# Patient Record
Sex: Male | Born: 1940 | Race: Black or African American | Hispanic: No | Marital: Married | Smoking: Never smoker
Health system: Southern US, Community
[De-identification: ages and names within clinical notes are randomized; demographics above are authoritative.]

## PROBLEM LIST (undated history)

## (undated) DIAGNOSIS — A419 Sepsis, unspecified organism: Secondary | ICD-10-CM

## (undated) DIAGNOSIS — F0390 Unspecified dementia without behavioral disturbance: Secondary | ICD-10-CM

## (undated) DIAGNOSIS — N400 Enlarged prostate without lower urinary tract symptoms: Secondary | ICD-10-CM

## (undated) DIAGNOSIS — R2681 Unsteadiness on feet: Secondary | ICD-10-CM

## (undated) DIAGNOSIS — G4089 Other seizures: Secondary | ICD-10-CM

## (undated) DIAGNOSIS — N1330 Unspecified hydronephrosis: Secondary | ICD-10-CM

## (undated) DIAGNOSIS — J1282 Pneumonia due to coronavirus disease 2019: Secondary | ICD-10-CM

## (undated) DIAGNOSIS — G9341 Metabolic encephalopathy: Secondary | ICD-10-CM

## (undated) DIAGNOSIS — M6281 Muscle weakness (generalized): Secondary | ICD-10-CM

## (undated) DIAGNOSIS — R2689 Other abnormalities of gait and mobility: Secondary | ICD-10-CM

## (undated) DIAGNOSIS — R278 Other lack of coordination: Secondary | ICD-10-CM

## (undated) DIAGNOSIS — R1312 Dysphagia, oropharyngeal phase: Secondary | ICD-10-CM

## (undated) DIAGNOSIS — J9601 Acute respiratory failure with hypoxia: Secondary | ICD-10-CM

## (undated) DIAGNOSIS — U071 COVID-19: Secondary | ICD-10-CM

## (undated) DIAGNOSIS — N179 Acute kidney failure, unspecified: Secondary | ICD-10-CM

## (undated) DIAGNOSIS — K219 Gastro-esophageal reflux disease without esophagitis: Secondary | ICD-10-CM

## (undated) DIAGNOSIS — R41841 Cognitive communication deficit: Secondary | ICD-10-CM

## (undated) DIAGNOSIS — R6521 Severe sepsis with septic shock: Secondary | ICD-10-CM

## (undated) DIAGNOSIS — I10 Essential (primary) hypertension: Secondary | ICD-10-CM

## (undated) HISTORY — PX: NO PAST SURGERIES: SHX2092

---

## 1997-11-17 ENCOUNTER — Other Ambulatory Visit: Admission: RE | Admit: 1997-11-17 | Discharge: 1997-11-17 | Payer: Self-pay | Admitting: Urology

## 1999-12-13 ENCOUNTER — Other Ambulatory Visit: Admission: RE | Admit: 1999-12-13 | Discharge: 1999-12-13 | Payer: Self-pay | Admitting: Urology

## 2000-03-03 ENCOUNTER — Emergency Department (HOSPITAL_COMMUNITY): Admission: EM | Admit: 2000-03-03 | Discharge: 2000-03-04 | Payer: Self-pay | Admitting: Internal Medicine

## 2000-12-04 ENCOUNTER — Other Ambulatory Visit: Admission: RE | Admit: 2000-12-04 | Discharge: 2000-12-04 | Payer: Self-pay | Admitting: Urology

## 2000-12-04 ENCOUNTER — Encounter (INDEPENDENT_AMBULATORY_CARE_PROVIDER_SITE_OTHER): Payer: Self-pay | Admitting: Specialist

## 2007-02-20 ENCOUNTER — Encounter: Admission: RE | Admit: 2007-02-20 | Discharge: 2007-02-20 | Payer: Self-pay | Admitting: Cardiology

## 2012-01-09 DIAGNOSIS — R972 Elevated prostate specific antigen [PSA]: Secondary | ICD-10-CM | POA: Diagnosis not present

## 2012-01-14 DIAGNOSIS — N400 Enlarged prostate without lower urinary tract symptoms: Secondary | ICD-10-CM | POA: Diagnosis not present

## 2012-01-14 DIAGNOSIS — R972 Elevated prostate specific antigen [PSA]: Secondary | ICD-10-CM | POA: Diagnosis not present

## 2012-07-08 DIAGNOSIS — N400 Enlarged prostate without lower urinary tract symptoms: Secondary | ICD-10-CM | POA: Diagnosis not present

## 2012-07-24 DIAGNOSIS — Z23 Encounter for immunization: Secondary | ICD-10-CM | POA: Diagnosis not present

## 2012-09-09 DIAGNOSIS — N400 Enlarged prostate without lower urinary tract symptoms: Secondary | ICD-10-CM | POA: Diagnosis not present

## 2012-11-11 DIAGNOSIS — R351 Nocturia: Secondary | ICD-10-CM | POA: Diagnosis not present

## 2012-11-11 DIAGNOSIS — R350 Frequency of micturition: Secondary | ICD-10-CM | POA: Diagnosis not present

## 2012-12-15 DIAGNOSIS — N400 Enlarged prostate without lower urinary tract symptoms: Secondary | ICD-10-CM | POA: Diagnosis not present

## 2013-01-20 DIAGNOSIS — N400 Enlarged prostate without lower urinary tract symptoms: Secondary | ICD-10-CM | POA: Diagnosis not present

## 2013-01-20 DIAGNOSIS — R972 Elevated prostate specific antigen [PSA]: Secondary | ICD-10-CM | POA: Diagnosis not present

## 2013-04-18 ENCOUNTER — Emergency Department (HOSPITAL_COMMUNITY): Payer: Medicare Other

## 2013-04-18 ENCOUNTER — Encounter (HOSPITAL_COMMUNITY): Payer: Self-pay | Admitting: Emergency Medicine

## 2013-04-18 ENCOUNTER — Emergency Department (HOSPITAL_COMMUNITY)
Admission: EM | Admit: 2013-04-18 | Discharge: 2013-04-18 | Disposition: A | Discharge status: Home / Self Care | Payer: Medicare Other | Attending: Emergency Medicine | Admitting: Emergency Medicine

## 2013-04-18 DIAGNOSIS — Z8659 Personal history of other mental and behavioral disorders: Secondary | ICD-10-CM | POA: Insufficient documentation

## 2013-04-18 DIAGNOSIS — N289 Disorder of kidney and ureter, unspecified: Secondary | ICD-10-CM

## 2013-04-18 DIAGNOSIS — R55 Syncope and collapse: Secondary | ICD-10-CM | POA: Insufficient documentation

## 2013-04-18 DIAGNOSIS — I10 Essential (primary) hypertension: Secondary | ICD-10-CM | POA: Insufficient documentation

## 2013-04-18 DIAGNOSIS — Z79899 Other long term (current) drug therapy: Secondary | ICD-10-CM | POA: Diagnosis not present

## 2013-04-18 DIAGNOSIS — R404 Transient alteration of awareness: Secondary | ICD-10-CM | POA: Diagnosis not present

## 2013-04-18 HISTORY — DX: Essential (primary) hypertension: I10

## 2013-04-18 HISTORY — DX: Unspecified dementia, unspecified severity, without behavioral disturbance, psychotic disturbance, mood disturbance, and anxiety: F03.90

## 2013-04-18 LAB — COMPREHENSIVE METABOLIC PANEL
Albumin: 3.8 g/dL (ref 3.5–5.2)
BUN: 17 mg/dL (ref 6–23)
Calcium: 9.5 mg/dL (ref 8.4–10.5)
Creatinine, Ser: 1.82 mg/dL — ABNORMAL HIGH (ref 0.50–1.35)
Total Protein: 7.4 g/dL (ref 6.0–8.3)

## 2013-04-18 LAB — CBC WITH DIFFERENTIAL/PLATELET
Basophils Absolute: 0 10*3/uL (ref 0.0–0.1)
Basophils Relative: 0 % (ref 0–1)
Eosinophils Absolute: 0.1 10*3/uL (ref 0.0–0.7)
HCT: 45.3 % (ref 39.0–52.0)
Hemoglobin: 14.6 g/dL (ref 13.0–17.0)
MCH: 28.3 pg (ref 26.0–34.0)
MCHC: 32.2 g/dL (ref 30.0–36.0)
Monocytes Absolute: 0.7 10*3/uL (ref 0.1–1.0)
Monocytes Relative: 7 % (ref 3–12)
Neutrophils Relative %: 81 % — ABNORMAL HIGH (ref 43–77)
RDW: 13.6 % (ref 11.5–15.5)

## 2013-04-18 LAB — URINALYSIS, ROUTINE W REFLEX MICROSCOPIC
Bilirubin Urine: NEGATIVE
Leukocytes, UA: NEGATIVE
Nitrite: NEGATIVE
Specific Gravity, Urine: 1.017 (ref 1.005–1.030)
Urobilinogen, UA: 0.2 mg/dL (ref 0.0–1.0)

## 2013-04-18 LAB — POCT I-STAT TROPONIN I: Troponin i, poc: 0 ng/mL (ref 0.00–0.08)

## 2013-04-18 LAB — PROTIME-INR
INR: 1.08 (ref 0.00–1.49)
Prothrombin Time: 13.8 seconds (ref 11.6–15.2)

## 2013-04-18 MED ORDER — SODIUM CHLORIDE 0.9 % IV SOLN
1000.0000 mL | Freq: Once | INTRAVENOUS | Status: AC
  Administered 2013-04-18: 1000 mL via INTRAVENOUS

## 2013-04-18 MED ORDER — SODIUM CHLORIDE 0.9 % IV SOLN: 1000.0000 mL | INTRAVENOUS | Status: DC

## 2013-04-18 NOTE — ED Provider Notes (Signed)
CSN: [id number]     Arrival date & time 04/18/13  1108 History   First MD Initiated Contact with Patient 04/18/13 1108     Chief Complaint  Patient presents with  . Loss of Consciousness   (Consider location/radiation/quality/duration/timing/severity/associated sxs/prior Treatment) HPI Patient presents after an episode of syncope. He has dementia, this is a level V caveat. Additional history of present illness details are per the patient's wife. She states that the patient had a loss of consciousness just before ED arrival. Patient was at church, stood to read a prior, subsequently fell forward.  He was unresponsive for several minutes.  On EMS arrival the patient was close to baseline, awake, alert, and talking. There is no report of either prodromal no, nor recovery chest pain, headache. No vomiting, no diarrhea, no incontinence. Patient has been in his usual state of health, with no new medication, activity, diet. The patient himself denies pain, though he is withdrawn, minimally interactive. Past Medical History  Diagnosis Date  . Dementia   . Hypertension    History reviewed. No pertinent past surgical history. History reviewed. No pertinent family history. History  Substance Use Topics  . Smoking status: Never Smoker   . Smokeless tobacco: Never Used  . Alcohol Use: No    Review of Systems  Unable to perform ROS: Dementia    Allergies  Review of patient's allergies indicates no known allergies.  Home Medications   Current Outpatient Rx  Name  Route  Sig  Dispense  Refill  . amLODipine-olmesartan (AZOR) 5-40 MG per tablet   Oral   Take 1 tablet by mouth daily.         Marland Kitchen donepezil (ARICEPT) 10 MG tablet   Oral   Take 10 mg by mouth 2 (two) times daily.         . hydrochlorothiazide (HYDRODIURIL) 12.5 MG tablet   Oral   Take 12.5 mg by mouth daily.         . memantine (NAMENDA) 10 MG tablet   Oral   Take 10 mg by mouth daily.          BP 106/68   Pulse 51  Resp 20  SpO2 100% Physical Exam  Nursing note and vitals reviewed. Constitutional: He appears well-developed. No distress.  HENT:  Head: Normocephalic and atraumatic.  Eyes: Conjunctivae and EOM are normal.  Cardiovascular: Normal rate and regular rhythm.   Pulmonary/Chest: Effort normal. No stridor. No respiratory distress.  Abdominal: He exhibits no distension.  Musculoskeletal: He exhibits no edema.  Neurological: He is alert. He displays no atrophy and no tremor. No cranial nerve deficit or sensory deficit. He exhibits normal muscle tone. He displays no seizure activity. Coordination normal.  Skin: Skin is warm and dry.  Psychiatric: He has a normal mood and affect. His speech is delayed. He is slowed and withdrawn. Cognition and memory are impaired.    ED Course  Procedures (including critical care time) Labs Review Labs Reviewed  CBC WITH DIFFERENTIAL  COMPREHENSIVE METABOLIC PANEL  PROTIME-INR  URINALYSIS, ROUTINE W REFLEX MICROSCOPIC  POCT CBG (FASTING - GLUCOSE)-MANUAL ENTRY   Imaging Review Ct Head Wo Contrast  04/18/2013   *RADIOLOGY REPORT*  Clinical Data: Syncopal episode this morning  CT HEAD WITHOUT CONTRAST  Technique:  Contiguous axial images were obtained from the base of the skull through the vertex without contrast.  Comparison: None.  Findings: Moderate diffuse atrophy.  No focal abnormal attenuation to suggest infarct, hemorrhage, mass, or extra-axial fluid.  Mild deep white matter low attenuation.  Calvarium intact.  No significant sinus inflammatory change.  IMPRESSION: Age related involution.  No acute findings.   Original Report Authenticated By: Esperanza Heir, M.D.   Pulse ox 99% room air normal Cardiac monitor 49, sinus bradycardia abnormal EKG has sinus rhythm, rate 54, significant artifact, with minimal discernible acute changes Abnormal  On repeat exam the patient appears calm.  Update: Orthostatic vital signs are unremarkable. 2:47  PM Patient remains in no distress.  He is smiling, sitting upright.    I had a lengthy discussion with the patient's wife, his daughter regarding lab findings, vital sign findings.      MDM  No diagnosis found. Patient presents after an episode of syncope that occurred at church.  Notably, the patient's episode occurred as he stood up, and throughout his emergency room stay he has been in no distress, hemodynamic stable.  Patient has some evidence of hypovolemia, including abnormal renal function.  However, process of the patient's labs are unremarkable, with low suspicion for ongoing dysrhythmia, lateral abnormality or infection.  I had a lengthy discussion with the patient, his wife and their daughter regarding possible causes for the syncope, and the likelihood of hypovolemia, dehydration as a causative. With minor renal dysfunction, the patient will stop hydrochlorothiazide, follow up with his physician in the morning. I offered admission for monitoring and medication titration versus discharge with close followup.  The family voices a preference for next-day followup.  This seems reasonable given the absence of distress, no history of dementia, the likelihood of hypovolemia as causative etiology.    Gerhard Munch, MD 04/18/13 1450

## 2013-04-18 NOTE — ED Notes (Addendum)
Per EMS: Pt had a witnessed syncopal episode in church this morning.  Pt does not remember the event.  BP was low for EMS - SBP 88. HR Low.  12 lead unremarkable.  Hx dementia.  No neuro deficits.  Denies pain. No cardiac history.

## 2013-04-18 NOTE — ED Notes (Signed)
Pt is unable to give urine specimen at this time. The patient has been advised to use call light for assistance. The tech has reported to RN in charge.

## 2013-04-19 DIAGNOSIS — N400 Enlarged prostate without lower urinary tract symptoms: Secondary | ICD-10-CM | POA: Diagnosis not present

## 2013-05-13 DIAGNOSIS — R350 Frequency of micturition: Secondary | ICD-10-CM | POA: Diagnosis not present

## 2013-05-13 DIAGNOSIS — R972 Elevated prostate specific antigen [PSA]: Secondary | ICD-10-CM | POA: Diagnosis not present

## 2013-05-13 DIAGNOSIS — N400 Enlarged prostate without lower urinary tract symptoms: Secondary | ICD-10-CM | POA: Diagnosis not present

## 2013-05-17 ENCOUNTER — Ambulatory Visit (INDEPENDENT_AMBULATORY_CARE_PROVIDER_SITE_OTHER): Payer: Medicare Other | Admitting: Neurology

## 2013-05-17 ENCOUNTER — Encounter: Payer: Self-pay | Admitting: Neurology

## 2013-05-17 VITALS — BP 160/79 | HR 57 | Ht 70.0 in | Wt 154.0 lb

## 2013-05-17 DIAGNOSIS — F0280 Dementia in other diseases classified elsewhere without behavioral disturbance: Secondary | ICD-10-CM | POA: Diagnosis not present

## 2013-05-17 DIAGNOSIS — F09 Unspecified mental disorder due to known physiological condition: Secondary | ICD-10-CM

## 2013-05-17 DIAGNOSIS — R4189 Other symptoms and signs involving cognitive functions and awareness: Secondary | ICD-10-CM

## 2013-05-17 DIAGNOSIS — E538 Deficiency of other specified B group vitamins: Secondary | ICD-10-CM | POA: Diagnosis not present

## 2013-05-17 MED ORDER — MEMANTINE HCL 10 MG PO TABS: 10.0000 mg | ORAL_TABLET | Freq: Two times a day (BID) | ORAL | Status: DC

## 2013-05-17 NOTE — Patient Instructions (Signed)
Overall you are doing fairly well but I do want to suggest a few things today:   As far as your medications are concerned, I would like to suggest increasing the Namenda to 10mg  twice a day.   As far as diagnostic testing: We will check some blood work today to rule out reversible causes of your memory decline.  We will send you to occupational therapy to do a formal evaluation of your ability to drive.   I would like to see you back in 4 to 6 months, sooner if we need to. Please call us with any interim questions, concerns, problems, updates or refill requests.   Please also call us for any test results so we can go over those with you on the phone.  My clinical assistant and will answer any of your questions and relay your messages to me and also relay most of my messages to you.   Our phone number is [phone]. We also have an after hours call service for urgent matters and there is a physician on-call for urgent questions. For any emergencies you know to call 911 or go to the nearest emergency room

## 2013-05-17 NOTE — Progress Notes (Signed)
[name]    Provider:  Dr Hosie Poisson Referring Provider: Pola Corn, MD Primary Care Physician:  Pola Corn, MD  CC:  Cognitive decline  HPI:  Donald [name] is a 72 y.o. male here as a referral from Dr. Shana Chute for cognitive decline Started noticing around 4 years ago, has been getting progressively worse, increased decline in the past yar. Trouble with short term memory, trouble remembering conversations. Wife manages the finances, this changed 3 years ago. Continues driving, short distances. Wife has some concerns about his driving.No episodes of getting lost. Has good remote memory. No history of strokes or TIAs. Has sister with dementia. No tobacco or EtOH. 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 Has some nighttime agitation. No hallucinations.   Aricept and Namenda started around 3 to 4 years ago. Wife has not noticed much benefit.   Has had multiple syncopal episodes, following with Dr Shana Chute, adjusting his blood pressure medication. Gets very diaphoretic during these episodes. No extremity shaking.   Review of Systems: Out of a complete 14 system review, the patient complains of only the following symptoms, and all other reviewed systems are negative. Positive for memory loss confusion passing out snoring change in appetite  History   Social History  . Marital Status: Married    Spouse Name: mary    Number of Children: 2  . Years of Education: college   Occupational History  . retired    Social History Main Topics  . Smoking status: Never Smoker   . Smokeless tobacco: Never Used  . Alcohol Use: No  . Drug Use: No  . Sexual Activity: Not on file   Other Topics Concern  . Not on file   Social History Narrative  . No narrative on file    No family history on file.  Past Medical History  Diagnosis Date  . Dementia   . Hypertension     No past surgical history on  file.  Current Outpatient Prescriptions  Medication Sig Dispense Refill  . amLODipine-olmesartan (AZOR) 5-40 MG per tablet Take 1 tablet by mouth daily.      Marland Kitchen donepezil (ARICEPT) 10 MG tablet Take 10 mg by mouth 2 (two) times daily.      . memantine (NAMENDA) 10 MG tablet Take 10 mg by mouth daily.      . Multiple Vitamin (MULTIVITAMIN) tablet Take 1 tablet by mouth daily.       No current facility-administered medications for this visit.    Allergies as of 05/17/2013  . (No Known Allergies)    Vitals: BP 160/79  Pulse 57  Ht 5\' 10"  (1.778 m)  Wt 154 lb (69.854 kg)  BMI 22.1 kg/m2 Last Weight:  Wt Readings from Last 1 Encounters:  05/17/13 154 lb (69.854 kg)   Last Height:   Ht Readings from Last 1 Encounters:  05/17/13 5\' 10"  (1.778 m)     Physical exam: Exam: Gen: NAD, conversant Eyes: anicteric sclerae, moist conjunctivae HENT: Atraumatic, oropharynx clear Neck: Trachea midline; supple,  Lungs: CTA, no wheezing, rales, rhonic                          CV: RRR, no MRG Abdomen: Soft, non-tender;  Extremities: No peripheral edema  Skin: Normal temperature, no rash,  Psych: Appropriate affect, pleasant  Neuro: MS: AA&Ox2, appropriately interactive, normal affect   Trouble with 2-3 step commands,  Speech: fluent w/o paraphasic error +Glabellar, negative snout and negative palmomental reflex  CN: PERRL, EOMI no nystagmus, no ptosis, sensation intact to LT V1-V3 bilat, face symmetric, no weakness, hearing grossly intact, palate elevates symmetrically, shoulder shrug 5/5 bilat,  tongue protrudes midline, no fasiculations noted.  Motor: normal bulk and tone Strength: 5/5  In all extremities  Coord: rapid alternating and point-to-point (FNF, HTS) movements intact.  Reflexes: symmetrical, bilat downgoing toes  Sens: LT intact in all extremities  Gait: posture, stance, stride and arm-swing normal.  Assessment:  After physical and neurologic examination,  review of laboratory studies, imaging, neurophysiology testing and pre-existing records, assessment will be reviewed on the problem list.  Plan:  Treatment plan and additional workup will be reviewed under Problem List.  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 Physical exam and history otherwise unremarkable, making a vascular type dementia less likely. No clinical findings of a parkinsonism or other neurodegenerative disorder. Will check lab workup for reversible causes of dementia, can consider MRI of the brain in the future. Will refer patient to occupational therapy for formal driving evaluation. Will increase Namenda to 10 mg twice a day, can consider changing to extended release formulation the future. Can consider switch from Aricept to Exelon to see if any increase in symptomatic benefit in the future. Followup in 4-6 months.

## 2013-05-20 LAB — VITAMIN B1, WHOLE BLOOD: Thiamine: 165.8 nmol/L (ref 66.5–200.0)

## 2013-05-21 ENCOUNTER — Telehealth: Payer: Self-pay

## 2013-05-21 NOTE — Telephone Encounter (Signed)
They can have it done at [facility] (call Bobbe Medico at [phone]) or [facility] (call Rip Harbour at [phone])

## 2013-05-21 NOTE — Telephone Encounter (Signed)
Called HP#, no answer.

## 2013-05-21 NOTE — Telephone Encounter (Signed)
Message copied by Doree Barthel on Fri May 21, 2013 10:22 AM ------      Message from: Ramond Marrow      Created: Fri May 21, 2013  7:56 AM       Please let him know his labs were normal. Thanks. ------

## 2013-05-21 NOTE — Telephone Encounter (Signed)
Spoke to spouse. Gave info. Spouse was grateful.

## 2013-05-21 NOTE — Telephone Encounter (Signed)
Spoke to spouse. Gave lab results. Spouse wanted to know status of OT referral also. Showing note that the place we normally refer to for OT does not do driving evals. Will f/u with Dr. Hosie Poisson.

## 2013-05-24 NOTE — Progress Notes (Signed)
Quick Note:  I called and LMVM for pt on home #. Labs normal. May return call if questions. ______

## 2013-09-01 DIAGNOSIS — N400 Enlarged prostate without lower urinary tract symptoms: Secondary | ICD-10-CM | POA: Diagnosis not present

## 2013-09-27 ENCOUNTER — Encounter (INDEPENDENT_AMBULATORY_CARE_PROVIDER_SITE_OTHER): Payer: Self-pay

## 2013-09-27 ENCOUNTER — Ambulatory Visit (INDEPENDENT_AMBULATORY_CARE_PROVIDER_SITE_OTHER): Payer: Medicare Other | Admitting: Neurology

## 2013-09-27 ENCOUNTER — Encounter: Payer: Self-pay | Admitting: Neurology

## 2013-09-27 VITALS — BP 117/67 | HR 63 | Ht 69.25 in | Wt 153.0 lb

## 2013-09-27 DIAGNOSIS — F0280 Dementia in other diseases classified elsewhere without behavioral disturbance: Secondary | ICD-10-CM

## 2013-09-27 DIAGNOSIS — G309 Alzheimer's disease, unspecified: Secondary | ICD-10-CM | POA: Diagnosis not present

## 2013-09-27 MED ORDER — MEMANTINE HCL 10 MG PO TABS: 10.0000 mg | ORAL_TABLET | Freq: Two times a day (BID) | ORAL | Status: DC

## 2013-09-27 MED ORDER — DONEPEZIL HCL 23 MG PO TABS
23.0000 mg | ORAL_TABLET | Freq: Every day | ORAL | Status: DC
Start: 2013-09-27 — End: 2014-04-30

## 2013-09-27 NOTE — Progress Notes (Signed)
[name]    Provider:  Dr Hosie Poisson Referring Provider: Pola Corn, MD Primary Care Physician:  Donald Corn, MD  CC:  Cognitive decline  HPI:  Donald [name] is a 73 y.o. male here as a follow up from Dr. Shana [name] for cognitive decline with initial visit on 05/2013. At that visit he was referred for a formal driving evaluation due to concern over safety of him driving. Had one trip to the ED since last visit, spouse notes he passed out but is unclear why it happened but it was found that he was very dehydrated at that time. Spouse notes that he does not hydrate well throughout the day. Wife has talked to him about driving and he is not driving anymore. Wife notes that the memory is not getting any better. He is doing a lot of sleep walking, he does not recall this. His spouse is able to direct him back to bed but he can get agitated.    Initial visit 05/2013: Started noticing around 4 years ago, has been getting progressively worse, increased decline in the past yar. Trouble with short term memory, trouble remembering conversations. Wife manages the finances, this changed 3 years ago. Continues driving, short distances. Wife has some concerns about his driving.No episodes of getting lost. Has good remote memory. No history of strokes or TIAs. Has sister with dementia. No tobacco or EtOH. 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 Has some nighttime agitation. No hallucinations.   Aricept and Namenda started around 3 to 4 years ago. Wife has not noticed much benefit.   Has had multiple syncopal episodes, following with Dr Donald [name], adjusting his blood pressure medication. Gets very diaphoretic during these episodes. No extremity shaking.   Review of Systems: Out of a complete 14 system review, the patient complains of only the following symptoms, and all other reviewed systems are negative. Positive for  activity change, chills, passing out  History   Social History  . Marital Status: Married    Spouse Name: Donald [name]    Number of Children: 2  . Years of Education: college   Occupational History  . retired    Social History Main Topics  . Smoking status: Never Smoker   . Smokeless tobacco: Never Used  . Alcohol Use: No  . Drug Use: No  . Sexual Activity: Not on file   Other Topics Concern  . Not on file   Social History Narrative   Patient is married Donald [name]), has 2 children   Patient is right handed   Education level is college   Caffeine consumption is 0    No family history on file.  Past Medical History  Diagnosis Date  . Dementia   . Hypertension     No past surgical history on file.  Current Outpatient Prescriptions  Medication Sig Dispense Refill  . amLODipine (NORVASC) 10 MG tablet Take 10 mg by mouth daily.      . memantine (NAMENDA) 10 MG tablet Take 1 tablet (10 mg total) by mouth 2 (two) times daily.  60 tablet  3  . VALSARTAN-HYDROCHLOROTHIAZIDE PO Take 25 mg by mouth.      . donepezil (ARICEPT) 23 MG TABS tablet Take 1 tablet (23 mg total) by mouth at bedtime.  30 tablet  6   No current facility-administered medications for this visit.    Allergies as of 09/27/2013  . (No Known Allergies)  Vitals: BP 117/67  Pulse 63  Ht 5' 9.25" (1.759 m)  Wt 153 lb (69.4 kg)  BMI 22.43 kg/m2 Last Weight:  Wt Readings from Last 1 Encounters:  09/27/13 153 lb (69.4 kg)   Last Height:   Ht Readings from Last 1 Encounters:  09/27/13 5' 9.25" (1.759 m)     Physical exam: Exam: Gen: NAD, conversant Eyes: anicteric sclerae, moist conjunctivae HENT: Atraumatic, oropharynx clear Neck: Trachea midline; supple,  Lungs: CTA, no wheezing, rales, rhonic                          CV: RRR, no MRG Abdomen: Soft, non-tender;  Extremities: No peripheral edema  Skin: Normal temperature, no rash,  Psych: Appropriate affect, pleasant  Neuro: MS: MOCA  16/30  Speech: fluent w/o paraphasic error +Glabellar, negative snout and negative palmomental reflex  CN: PERRL, EOMI no nystagmus, no ptosis, sensation intact to LT V1-V3 bilat, face symmetric, no weakness, hearing grossly intact, palate elevates symmetrically, shoulder shrug 5/5 bilat,  tongue protrudes midline, no fasiculations noted.  Motor: normal bulk and tone Strength: 5/5  In all extremities  Coord: rapid alternating and point-to-point (FNF, HTS) movements intact.  Reflexes: symmetrical, bilat downgoing toes  Sens: LT intact in all extremities  Gait: posture, stance, stride and arm-swing normal.  Assessment:  After physical and neurologic examination, review of laboratory studies, imaging, neurophysiology testing and pre-existing records, assessment will be reviewed on the problem list.  Plan:  Treatment plan and additional workup will be reviewed under Problem List.  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 Physical exam and history otherwise unremarkable, making a vascular type dementia less likely. No clinical findings of a parkinsonism or other neurodegenerative disorder. Returns today with subjective worsening of his symptoms. Will increase Aricept to 23mg  night, continue Namenda 10mg  BID and will add Melatonin 5 to 10mg  nightly for sleep walking. Can consider MRI at next appointment though will likely not alter course of treatment. Followup in 4-6 months.

## 2013-09-27 NOTE — Patient Instructions (Signed)
Overall you are doing fairly well but I do want to suggest a few things today:   Please hydrate well throughout the day, this will help prevent passing out episodes.   As far as your medications are concerned, I would like to suggest the following: 1)We will increase the Aricept to 23mg  nightly 2)Continue to take the Namenda 10mg  twice a day 3)I suggest trying a medication called melatonin nightly, this should help with the sleep walking. Try taking 5 to 10 mg nightly. You can purchase this over the counter at your drug store  I would like to see you back in 4 months, sooner if we need to. Please call us with any interim questions, concerns, problems, updates or refill requests.   Please also call us for any test results so we can go over those with you on the phone.  My clinical assistant and will answer any of your questions and relay your messages to me and also relay most of my messages to you.   Our phone number is [phone]. We also have an after hours call service for urgent matters and there is a physician on-call for urgent questions. For any emergencies you know to call 911 or go to the nearest emergency room

## 2013-10-15 ENCOUNTER — Ambulatory Visit: Payer: Medicare Other | Admitting: Neurology

## 2013-11-18 DIAGNOSIS — N400 Enlarged prostate without lower urinary tract symptoms: Secondary | ICD-10-CM | POA: Diagnosis not present

## 2013-11-18 DIAGNOSIS — R350 Frequency of micturition: Secondary | ICD-10-CM | POA: Diagnosis not present

## 2013-12-01 DIAGNOSIS — N400 Enlarged prostate without lower urinary tract symptoms: Secondary | ICD-10-CM | POA: Diagnosis not present

## 2014-01-24 ENCOUNTER — Telehealth: Payer: Self-pay | Admitting: *Deleted

## 2014-01-24 NOTE — Telephone Encounter (Signed)
Calling patient to r/s appointment for tomorrow, left message to return the call to r/s appointment

## 2014-01-25 ENCOUNTER — Ambulatory Visit: Payer: Medicare Other | Admitting: Neurology

## 2014-01-26 ENCOUNTER — Ambulatory Visit (INDEPENDENT_AMBULATORY_CARE_PROVIDER_SITE_OTHER): Payer: Medicare Other | Admitting: Neurology

## 2014-01-26 ENCOUNTER — Encounter: Payer: Self-pay | Admitting: Neurology

## 2014-01-26 VITALS — BP 143/83 | HR 57 | Ht 69.5 in | Wt 154.0 lb

## 2014-01-26 DIAGNOSIS — G309 Alzheimer's disease, unspecified: Secondary | ICD-10-CM | POA: Diagnosis not present

## 2014-01-26 DIAGNOSIS — F0280 Dementia in other diseases classified elsewhere without behavioral disturbance: Secondary | ICD-10-CM | POA: Diagnosis not present

## 2014-01-26 MED ORDER — MEMANTINE HCL ER 28 MG PO CP24: 28.0000 mg | ORAL_CAPSULE | Freq: Every day | ORAL | Status: DC

## 2014-01-26 NOTE — Progress Notes (Signed)
[name]    Provider:  Dr Hosie PoissonSumner Referring Provider: Pola CornSpruill, [name] O, MD Primary Care Physician:  Pola CornSPRUILL,[name] O, MD  CC:  Cognitive decline  HPI:  Donald [name] is a 73 y.o. male here as a follow up from Dr. Shana ChuteSpruill for cognitive decline with prior visit on 10/2013. At that visit his Aricept was increased to 23mg . Minimal to no improvement noted with this. Continues to have visual and auditory hallucinations but wife notes this has decreased slightly since last visit. Wife notes that he can be easily redirected when he is having hallucinations. She notes that he sleeps a lot during the day, will watch a lot of TV. Notes he is not as active as he used to be. He is not currently driving. No difficulty eating, no choking. Wife notes he has been losing some weight, appetite is decreased. Has strong family history of memory loss.    Initial visit 05/2013: Started noticing around 4 years ago, has been getting progressively worse, increased decline in the past yar. Trouble with short term memory, trouble remembering conversations. Wife manages the finances, this changed 3 years ago. Continues driving, short distances. Wife has some concerns about his driving.No episodes of getting lost. Has good remote memory. No history of strokes or TIAs. Has sister with dementia. No tobacco or EtOH. 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 Has some nighttime agitation. No hallucinations.   Aricept and Namenda started around 3 to 4 years ago. Wife has not noticed much benefit.   Has had multiple syncopal episodes, following with Dr Shana ChuteSpruill, adjusting his blood pressure medication. Gets very diaphoretic during these episodes. No extremity shaking.   Review of Systems: Out of a complete 14 system review, the patient complains of only the following symptoms, and all other reviewed systems are negative. Positive for activity  change, chills, passing out  History   Social History  . Marital Status: Married    Spouse Name: Donald [name]    Number of Children: 2  . Years of Education: college   Occupational History  . retired    Social History Main Topics  . Smoking status: Never Smoker   . Smokeless tobacco: Never Used  . Alcohol Use: No  . Drug Use: No  . Sexual Activity: Not on file   Other Topics Concern  . Not on file   Social History Narrative   Patient is married Donald [name](Donald [name]), has 2 children   Patient is right handed   Education level is college   Caffeine consumption is 0    History reviewed. No pertinent family history.  Past Medical History  Diagnosis Date  . Dementia   . Hypertension     History reviewed. No pertinent past surgical history.  Current Outpatient Prescriptions  Medication Sig Dispense Refill  . amLODipine (NORVASC) 10 MG tablet Take 10 mg by mouth daily.      Marland Kitchen. donepezil (ARICEPT) 23 MG TABS tablet Take 1 tablet (23 mg total) by mouth at bedtime.  30 tablet  6  . memantine (NAMENDA) 10 MG tablet Take 1 tablet (10 mg total) by mouth 2 (two) times daily.  60 tablet  3  . VALSARTAN-HYDROCHLOROTHIAZIDE PO Take 25 mg by mouth.       No current facility-administered medications for this visit.    Allergies as of 01/26/2014  . (No Known Allergies)    Vitals: BP 143/83  Pulse 57  Ht 5'  9.5" (1.765 m)  Wt 154 lb (69.854 kg)  BMI 22.42 kg/m2 Last Weight:  Wt Readings from Last 1 Encounters:  01/26/14 154 lb (69.854 kg)   Last Height:   Ht Readings from Last 1 Encounters:  01/26/14 5' 9.5" (1.765 m)     Physical exam: Exam: Gen: NAD, conversant Eyes: anicteric sclerae, moist conjunctivae HENT: Atraumatic, oropharynx clear Neck: Trachea midline; supple,  Lungs: CTA, no wheezing, rales, rhonic                          CV: RRR, no MRG Abdomen: Soft, non-tender;  Extremities: No peripheral edema  Skin: Normal temperature, no rash,  Psych: Appropriate affect,  pleasant  Neuro: MS: MOCA 10/30 (prior visit 16/30)  Speech: fluent w/o paraphasic error +Glabellar, negative snout and negative palmomental reflex  CN: PERRL, EOMI no nystagmus, no ptosis, sensation intact to LT V1-V3 bilat, face symmetric, no weakness, hearing grossly intact, palate elevates symmetrically, shoulder shrug 5/5 bilat,  tongue protrudes midline, no fasiculations noted.  Motor: normal bulk and tone Strength: 5/5  In all extremities  Coord: rapid alternating and point-to-point (FNF, HTS) movements intact.  Reflexes: symmetrical, bilat downgoing toes  Sens: LT intact in all extremities  Gait: posture, stance, stride and arm-swing normal.  Assessment:  After physical and neurologic examination, review of laboratory studies, imaging, neurophysiology testing and pre-existing records, assessment will be reviewed on the problem list.  Plan:  Treatment plan and additional workup will be reviewed under Problem List.  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 Physical exam and history otherwise unremarkable, making a vascular type dementia less likely. No clinical findings of a parkinsonism or other neurodegenerative disorder. Returns today for follow up. Overall stable. Wife did not notice much change with increase in Aricept to 23mg  daily. Mild decrease in hallucinations noted. Will change Namenda to 28mg  XR once daily. Hallucinations are stable at this point but can consider use of Seroquel in the future.  Followup in 6 months

## 2014-01-26 NOTE — Telephone Encounter (Signed)
Patient was r/s to 01/26/14 at 3:30 pm

## 2014-01-27 DIAGNOSIS — R350 Frequency of micturition: Secondary | ICD-10-CM | POA: Diagnosis not present

## 2014-01-27 DIAGNOSIS — R351 Nocturia: Secondary | ICD-10-CM | POA: Diagnosis not present

## 2014-01-27 DIAGNOSIS — N139 Obstructive and reflux uropathy, unspecified: Secondary | ICD-10-CM | POA: Diagnosis not present

## 2014-01-27 DIAGNOSIS — N401 Enlarged prostate with lower urinary tract symptoms: Secondary | ICD-10-CM | POA: Diagnosis not present

## 2014-02-02 ENCOUNTER — Emergency Department (HOSPITAL_COMMUNITY)
Admission: EM | Admit: 2014-02-02 | Discharge: 2014-02-02 | Disposition: A | Discharge status: Home / Self Care | Payer: Medicare Other | Attending: Emergency Medicine | Admitting: Emergency Medicine

## 2014-02-02 ENCOUNTER — Emergency Department (HOSPITAL_COMMUNITY): Payer: Medicare Other

## 2014-02-02 ENCOUNTER — Encounter (HOSPITAL_COMMUNITY): Payer: Self-pay | Admitting: Emergency Medicine

## 2014-02-02 DIAGNOSIS — Z79899 Other long term (current) drug therapy: Secondary | ICD-10-CM | POA: Diagnosis not present

## 2014-02-02 DIAGNOSIS — I10 Essential (primary) hypertension: Secondary | ICD-10-CM | POA: Insufficient documentation

## 2014-02-02 DIAGNOSIS — F0390 Unspecified dementia without behavioral disturbance: Secondary | ICD-10-CM | POA: Diagnosis not present

## 2014-02-02 DIAGNOSIS — R55 Syncope and collapse: Secondary | ICD-10-CM | POA: Insufficient documentation

## 2014-02-02 DIAGNOSIS — R4182 Altered mental status, unspecified: Secondary | ICD-10-CM | POA: Diagnosis not present

## 2014-02-02 LAB — BASIC METABOLIC PANEL
ANION GAP: 15 (ref 5–15)
BUN: 20 mg/dL (ref 6–23)
CALCIUM: 9.2 mg/dL (ref 8.4–10.5)
CO2: 25 mEq/L (ref 19–32)
Chloride: 104 mEq/L (ref 96–112)
Creatinine, Ser: 2.17 mg/dL — ABNORMAL HIGH (ref 0.50–1.35)
GFR calc Af Amer: 33 mL/min — ABNORMAL LOW (ref >90)
GFR calc non Af Amer: 28 mL/min — ABNORMAL LOW (ref >90)
Glucose, Bld: 105 mg/dL — ABNORMAL HIGH (ref 70–99)
Potassium: 3.7 mEq/L (ref 3.7–5.3)
Sodium: 144 mEq/L (ref 137–147)

## 2014-02-02 LAB — URINALYSIS, ROUTINE W REFLEX MICROSCOPIC
BILIRUBIN URINE: NEGATIVE
GLUCOSE, UA: NEGATIVE mg/dL
Hgb urine dipstick: NEGATIVE
KETONES UR: NEGATIVE mg/dL
Leukocytes, UA: NEGATIVE
Nitrite: NEGATIVE
PH: 5 (ref 5.0–8.0)
Protein, ur: NEGATIVE mg/dL
Specific Gravity, Urine: 1.015 (ref 1.005–1.030)
Urobilinogen, UA: 0.2 mg/dL (ref 0.0–1.0)

## 2014-02-02 LAB — CBC
HCT: 42.3 % (ref 39.0–52.0)
Hemoglobin: 13.6 g/dL (ref 13.0–17.0)
MCH: 28.2 pg (ref 26.0–34.0)
MCHC: 32.2 g/dL (ref 30.0–36.0)
MCV: 87.8 fL (ref 78.0–100.0)
Platelets: 197 10*3/uL (ref 150–400)
RBC: 4.82 MIL/uL (ref 4.22–5.81)
RDW: 13.6 % (ref 11.5–15.5)
WBC: 11.8 10*3/uL — AB (ref 4.0–10.5)

## 2014-02-02 LAB — I-STAT TROPONIN, ED: Troponin i, poc: 0.02 ng/mL (ref 0.00–0.08)

## 2014-02-02 NOTE — ED Provider Notes (Signed)
CSN: [id number]     Arrival date & time 02/02/14  1439 History   None    Chief Complaint  Patient presents with  . Heat Exposure     (Consider location/radiation/quality/duration/timing/severity/associated sxs/prior Treatment) Patient is a 73 y.o. male presenting with near-syncope.  Near Syncope This is a recurrent problem. The current episode started today. The problem has been rapidly improving. Pertinent negatives include no abdominal pain, chest pain, chills, coughing, fever, headaches, nausea, neck pain, rash, sore throat or vomiting. Exacerbated by: working outside. He has tried rest for the symptoms. The treatment provided significant relief.    Past Medical History  Diagnosis Date  . Dementia   . Hypertension    History reviewed. No pertinent past surgical history. History reviewed. No pertinent family history. History  Substance Use Topics  . Smoking status: Never Smoker   . Smokeless tobacco: Never Used  . Alcohol Use: No    Review of Systems  Constitutional: Negative for fever and chills.  HENT: Negative for sore throat.   Eyes: Negative for pain.  Respiratory: Negative for cough and shortness of breath.   Cardiovascular: Positive for near-syncope. Negative for chest pain.  Gastrointestinal: Negative for nausea, vomiting and abdominal pain.  Genitourinary: Negative for dysuria and flank pain.  Musculoskeletal: Negative for back pain and neck pain.  Skin: Negative for rash.  Neurological: Negative for seizures and headaches.      Allergies  Review of patient's allergies indicates no known allergies.  Home Medications   Prior to Admission medications   Medication Sig Start Date End Date Taking? Authorizing Provider  amLODipine (NORVASC) 10 MG tablet Take 10 mg by mouth daily.   Yes Historical Provider, MD  donepezil (ARICEPT) 23 MG TABS tablet Take 1 tablet (23 mg total) by mouth at bedtime. 09/27/13  Yes Omelia BlackwaterPeter Justin Sumner, DO  Memantine HCl ER (NAMENDA  XR) 28 MG CP24 Take 28 mg by mouth daily. 01/26/14  Yes Omelia BlackwaterPeter Justin Sumner, DO  VALSARTAN-HYDROCHLOROTHIAZIDE PO Take 1 tablet by mouth daily.    Yes Historical Provider, MD   BP 119/72  Pulse 78  Temp(Src) 98 F (36.7 C) (Oral)  Resp 18  Ht 6' (1.829 m)  Wt 160 lb (72.576 kg)  BMI 21.70 kg/m2  SpO2 99% Physical Exam  Constitutional: He is oriented to person, place, and time. He appears well-developed and well-nourished. No distress.  HENT:  Head: Normocephalic and atraumatic.  Eyes: Pupils are equal, round, and reactive to light.  Neck: Normal range of motion.  Cardiovascular: Normal rate and regular rhythm.   Pulmonary/Chest: Effort normal and breath sounds normal.  Abdominal: Soft. He exhibits no distension. There is no tenderness.  Musculoskeletal: Normal range of motion.  Neurological: He is alert and oriented to person, place, and time.  Skin: Skin is warm. He is not diaphoretic.    ED Course  Procedures (including critical care time) Labs Review Labs Reviewed  CBC - Abnormal; Notable for the following:    WBC 11.8 (*)    All other components within normal limits  BASIC METABOLIC PANEL - Abnormal; Notable for the following:    Glucose, Bld 105 (*)    Creatinine, Ser 2.17 (*)    GFR calc non Af Amer 28 (*)    GFR calc Af Amer 33 (*)    All other components within normal limits  URINALYSIS, ROUTINE W REFLEX MICROSCOPIC  I-STAT TROPOININ, ED  Rosezena SensorI-STAT TROPOININ, ED    Imaging Review Dg Chest 2 View  02/02/2014   CLINICAL DATA:  Presyncope  EXAM: CHEST  2 VIEW  COMPARISON:  04/18/2013  FINDINGS: The heart size and mediastinal contours are within normal limits. Both lungs are clear. The visualized skeletal structures are unremarkable.  IMPRESSION: No active cardiopulmonary disease.   Electronically Signed   By: Marlan Palauharles  Clark M.D.   On: 02/02/2014 16:32     EKG Interpretation   Date/Time:  Wednesday February 02 2014 14:48:01 EDT Ventricular Rate:  72 PR Interval:   139 QRS Duration: 93 QT Interval:  423 QTC Calculation: 463 R Axis:   44 Text Interpretation:  Sinus rhythm Abnormal R-wave progression, early  transition No significant change since last tracing Confirmed by Rubin PayorPICKERING   MD, Harrold DonathNATHAN [phone]([id number]) on 02/02/2014 3:21:25 PM      MDM   Final diagnoses:  Pre-syncope    73 yo M with hx of HTN, dementia presents with presyncope.   Was outside in the heat, found by granddaughter. Patient was diaphoresis, and weak and almost passed out. EMS was called, found patient to be orthostatic, brought to ED. On 1L NS bolus on arrival.   Plan to obtain basic labs, EKG. Will give NS bolus.   Patient well appearing throughout stay in ED. Basic labs normal as above. Troponin / EKG unremarkable. Patient able to ambulate throughout ED without difficulty. Will d/c to home with strict return precautions. Patient and wife at bedside comfortable with plan. All questions answered. Discharged to home in stable condition. Patient seen and evaluated by myself and my attending, Dr. Rubin PayorPickering.   Imagene ShellerSteve Senai Kingsley, MD 02/02/14 2249

## 2014-02-02 NOTE — ED Notes (Signed)
Labs being drawn 

## 2014-02-02 NOTE — ED Notes (Signed)
Family t the bedside.  No complaints of pain anywhere.  Skin still sl damp

## 2014-02-02 NOTE — ED Notes (Signed)
The pt is in xray and returned.  Family remains at the bedside

## 2014-02-02 NOTE — Discharge Instructions (Signed)
Near-Syncope Near-syncope (commonly known as near fainting) is sudden weakness, dizziness, or feeling like you might pass out. During an episode of near-syncope, you may also develop pale skin, have tunnel vision, or feel sick to your stomach (nauseous). Near-syncope may occur when getting up after sitting or while standing for a long time. It is caused by a sudden decrease in blood flow to the brain. This decrease can result from various causes or triggers, most of which are not serious. However, because near-syncope can sometimes be a sign of something serious, a medical evaluation is required. The specific cause is often not determined. HOME CARE INSTRUCTIONS  Monitor your condition for any changes. The following actions may help to alleviate any discomfort you are experiencing:  Have someone stay with you until you feel stable.  Lie down right away and prop your feet up if you start feeling like you might faint. Breathe deeply and steadily. Wait until all the symptoms have passed. Most of these episodes last only a few minutes. You may feel tired for several hours.   Drink enough fluids to keep your urine clear or pale yellow.   If you are taking blood pressure or heart medicine, get up slowly when seated or lying down. Take several minutes to sit and then stand. This can reduce dizziness.  Follow up with your health care provider as directed. SEEK IMMEDIATE MEDICAL CARE IF:   You have a severe headache.   You have unusual pain in the chest, abdomen, or back.   You are bleeding from the mouth or rectum, or you have black or tarry stool.   You have an irregular or very fast heartbeat.   You have repeated fainting or have seizure-like jerking during an episode.   You faint when sitting or lying down.   You have confusion.   You have difficulty walking.   You have severe weakness.   You have vision problems.  MAKE SURE YOU:   Understand these instructions.  Will  watch your condition.  Will get help right away if you are not doing well or get worse. Document Released: 07/22/2005 Document Revised: 07/27/2013 Document Reviewed: 12/25/2012 ExitCare Patient Information 2015 [employer]. This information is not intended to replace advice given to you by your health care provider. Make sure you discuss any questions you have with your health care provider.  

## 2014-02-02 NOTE — ED Notes (Signed)
I gave the patient a happy meal, a cup of ice, and a ginger-ale.

## 2014-02-02 NOTE — ED Notes (Signed)
Pt c/o being cold temp checked

## 2014-02-02 NOTE — ED Notes (Signed)
Pt here by ems, pt was outside for 2+ hours had a near syncopal episode, with ems pt was orthostatic, pt had bp laying down of 108/60  And sitting up 80/50, denies pain, initially was diaphoretic,now warm and dry

## 2014-02-02 NOTE — ED Notes (Signed)
The pt returned from xray. He attempted to urinate but unable at present..  Family remains at the bedside

## 2014-02-02 NOTE — ED Notes (Signed)
Family remains at the bedside.  He just gave a urine.  Ed res back in to see

## 2014-02-03 NOTE — ED Provider Notes (Signed)
I saw and evaluated the patient, reviewed the resident's note and I agree with the findings and plan.   EKG Interpretation   Date/Time:  Wednesday February 02 2014 14:48:01 EDT Ventricular Rate:  72 PR Interval:  139 QRS Duration: 93 QT Interval:  423 QTC Calculation: 463 R Axis:   44 Text Interpretation:  Sinus rhythm Abnormal R-wave progression, early  transition No significant change since last tracing Confirmed by Rubin PayorPICKERING   MD, Harrold DonathNATHAN [phone]([id number]) on 02/02/2014 3:21:25 PM     Patient with near-syncope. He had been outside in the heat. Lab work is reassuring. Hypotension improved. He feels better we discharged home  Juliet Rudeathan R. Rubin PayorPickering, MD 02/03/14 [phone]

## 2014-02-23 DIAGNOSIS — N400 Enlarged prostate without lower urinary tract symptoms: Secondary | ICD-10-CM | POA: Diagnosis not present

## 2014-03-30 DIAGNOSIS — N401 Enlarged prostate with lower urinary tract symptoms: Secondary | ICD-10-CM | POA: Diagnosis not present

## 2014-03-30 DIAGNOSIS — N139 Obstructive and reflux uropathy, unspecified: Secondary | ICD-10-CM | POA: Diagnosis not present

## 2014-04-20 DIAGNOSIS — R55 Syncope and collapse: Secondary | ICD-10-CM | POA: Diagnosis not present

## 2014-04-30 ENCOUNTER — Other Ambulatory Visit: Payer: Self-pay | Admitting: Neurology

## 2014-06-14 DIAGNOSIS — N401 Enlarged prostate with lower urinary tract symptoms: Secondary | ICD-10-CM | POA: Diagnosis not present

## 2014-06-14 DIAGNOSIS — R3915 Urgency of urination: Secondary | ICD-10-CM | POA: Diagnosis not present

## 2014-06-14 DIAGNOSIS — R350 Frequency of micturition: Secondary | ICD-10-CM | POA: Diagnosis not present

## 2014-06-28 DIAGNOSIS — R3915 Urgency of urination: Secondary | ICD-10-CM | POA: Diagnosis not present

## 2014-06-28 DIAGNOSIS — R350 Frequency of micturition: Secondary | ICD-10-CM | POA: Diagnosis not present

## 2014-07-19 DIAGNOSIS — R351 Nocturia: Secondary | ICD-10-CM | POA: Diagnosis not present

## 2014-07-19 DIAGNOSIS — R3915 Urgency of urination: Secondary | ICD-10-CM | POA: Diagnosis not present

## 2014-07-19 DIAGNOSIS — R350 Frequency of micturition: Secondary | ICD-10-CM | POA: Diagnosis not present

## 2014-08-01 ENCOUNTER — Ambulatory Visit: Payer: Medicare Other | Admitting: Neurology

## 2014-08-03 ENCOUNTER — Ambulatory Visit: Payer: Medicare Other | Admitting: Adult Health

## 2014-08-03 DIAGNOSIS — R350 Frequency of micturition: Secondary | ICD-10-CM | POA: Diagnosis not present

## 2014-08-03 DIAGNOSIS — N401 Enlarged prostate with lower urinary tract symptoms: Secondary | ICD-10-CM | POA: Diagnosis not present

## 2014-08-03 DIAGNOSIS — R3915 Urgency of urination: Secondary | ICD-10-CM | POA: Diagnosis not present

## 2014-08-17 DIAGNOSIS — R1084 Generalized abdominal pain: Secondary | ICD-10-CM | POA: Diagnosis not present

## 2014-09-02 DIAGNOSIS — R3915 Urgency of urination: Secondary | ICD-10-CM | POA: Diagnosis not present

## 2014-09-02 DIAGNOSIS — R351 Nocturia: Secondary | ICD-10-CM | POA: Diagnosis not present

## 2014-09-02 DIAGNOSIS — R350 Frequency of micturition: Secondary | ICD-10-CM | POA: Diagnosis not present

## 2014-09-09 DIAGNOSIS — R3915 Urgency of urination: Secondary | ICD-10-CM | POA: Diagnosis not present

## 2014-09-09 DIAGNOSIS — R350 Frequency of micturition: Secondary | ICD-10-CM | POA: Diagnosis not present

## 2014-09-16 DIAGNOSIS — R350 Frequency of micturition: Secondary | ICD-10-CM | POA: Diagnosis not present

## 2014-09-16 DIAGNOSIS — R3915 Urgency of urination: Secondary | ICD-10-CM | POA: Diagnosis not present

## 2014-09-23 DIAGNOSIS — R3915 Urgency of urination: Secondary | ICD-10-CM | POA: Diagnosis not present

## 2014-09-23 DIAGNOSIS — R350 Frequency of micturition: Secondary | ICD-10-CM | POA: Diagnosis not present

## 2014-09-26 ENCOUNTER — Other Ambulatory Visit: Payer: Self-pay | Admitting: Neurology

## 2014-09-26 NOTE — Telephone Encounter (Signed)
Former Magazine features editorumner patient.  Has appt with NP next month

## 2014-09-30 DIAGNOSIS — R3915 Urgency of urination: Secondary | ICD-10-CM | POA: Diagnosis not present

## 2014-10-07 DIAGNOSIS — R3915 Urgency of urination: Secondary | ICD-10-CM | POA: Diagnosis not present

## 2014-10-07 DIAGNOSIS — R350 Frequency of micturition: Secondary | ICD-10-CM | POA: Diagnosis not present

## 2014-10-13 ENCOUNTER — Ambulatory Visit (INDEPENDENT_AMBULATORY_CARE_PROVIDER_SITE_OTHER): Payer: Medicare Other | Admitting: Adult Health

## 2014-10-13 ENCOUNTER — Encounter: Payer: Self-pay | Admitting: Adult Health

## 2014-10-13 VITALS — BP 157/90 | HR 60 | Ht 69.5 in | Wt 162.0 lb

## 2014-10-13 DIAGNOSIS — F0390 Unspecified dementia without behavioral disturbance: Secondary | ICD-10-CM | POA: Diagnosis not present

## 2014-10-13 NOTE — Progress Notes (Signed)
I have read the note, and I agree with the clinical assessment and plan.  Manav Pierotti KEITH   

## 2014-10-13 NOTE — Patient Instructions (Signed)
Continue Aricept and Namenda.  We will continue monitoring memory. If your symptoms worsen or you develop new symptoms please let us know.

## 2014-10-13 NOTE — Progress Notes (Signed)
PATIENT: Donald RobesFredrick D [name] DOB: 18-Jan-1941  REASON FOR VISIT: follow up- Dementia HISTORY FROM: patient  HISTORY OF PRESENT ILLNESS: 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 He was last seen by Dr. Hosie PoissonSumner. He returns today for follow-up. The patient is currently taking Aricept 23 mg daily as well as Namenda XR 28 mg. The patient reports that his memory has gotten better. His wife reports that his memory has gotten worse. The patient continues to complete ADLs independently. His wife states that occasionally he will not put on clean clothes. She states that he tends to lay his clothes out for him. The patient no longer operates a motor vehicle. The wife states the patient had to give up his job due to his memory. Wife now completes all the finances. Patient was having some hallucinations. His wife states that occasionally during the night he will talk in his sleep but denies the patient may imbalance. She states occasionally if he gets up to go the bathroom during the night he may get confused. She is normally able to tell him to go back to sleep and the patient calms down. She reports that the patient continues to sleep a lot during the day. He is also seen by a urologist for BPH. She states currently they are checking the nerves in his bladder every week for 12 weeks. Other than this no new medical issues since last seen.   HISTORY 01/26/14 (SUMNER): Donald [name] is a 74 y.o. male here as a follow up from Dr. Shana ChuteSpruill for cognitive decline with prior visit on 10/2013. At that visit his Aricept was increased to 23mg . Minimal to no improvement noted with this. Continues to have visual and auditory hallucinations but wife notes this has decreased slightly since last visit. Wife notes that he can be easily redirected when he is having hallucinations. She notes that he sleeps a lot during the day, will watch a lot of TV. Notes  he is not as active as he used to be. He is not currently driving. No difficulty eating, no choking. Wife notes he has been losing some weight, appetite is decreased. Has strong family history of memory loss.   Initial visit 05/2013: Started noticing around 4 years ago, has been getting progressively worse, increased decline in the past yar. Trouble with short term memory, trouble remembering conversations. Wife manages the finances, this changed 3 years ago. Continues driving, short distances. Wife has some concerns about his driving.No episodes of getting lost. Has good remote memory. No history of strokes or TIAs. Has sister with dementia. No tobacco or EtOH. 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 Has some nighttime agitation. No hallucinations.   Aricept and Namenda started around 3 to 4 years ago. Wife has not noticed much benefit.   Has had multiple syncopal episodes, following with Dr Shana ChuteSpruill, adjusting his blood pressure medication. Gets very diaphoretic during these episodes. No extremity shaking.    REVIEW OF SYSTEMS: Out of a complete 14 system review of symptoms, the patient complains only of the following symptoms, and all other reviewed systems are negative.  Frequency of urination, passing out, confusion, hallucinations, snoring, acting out dreams, moles, activity change  ALLERGIES: No Known Allergies  HOME MEDICATIONS: Outpatient Prescriptions Prior to Visit  Medication Sig Dispense Refill  . amLODipine (NORVASC) 10 MG tablet Take 10 mg by mouth daily.    .Marland Kitchen  donepezil (ARICEPT) 23 MG TABS tablet TAKE 1 TABLET (23 MG TOTAL) BY MOUTH AT BEDTIME. 30 tablet 6  . NAMENDA XR 28 MG CP24 24 hr capsule TAKE ONE CAPSULE BY MOUTH EVERY DAY 30 capsule 0  . VALSARTAN-HYDROCHLOROTHIAZIDE PO Take 1 tablet by mouth daily.      No facility-administered medications prior to visit.    PAST MEDICAL HISTORY: Past Medical History  Diagnosis  Date  . Dementia   . Hypertension     PAST SURGICAL HISTORY: Past Surgical History  Procedure Laterality Date  . No past surgeries      FAMILY HISTORY: No family history on file.  SOCIAL HISTORY: History   Social History  . Marital Status: Married    Spouse Name: mary  . Number of Children: 2  . Years of Education: college   Occupational History  . retired    Social History Main Topics  . Smoking status: Never Smoker   . Smokeless tobacco: Never Used  . Alcohol Use: No  . Drug Use: No  . Sexual Activity: Not on file   Other Topics Concern  . Not on file   Social History Narrative   Patient is married [name]), has 2 children   Patient is right handed   Education level is college   Caffeine consumption is 0      PHYSICAL EXAM  Filed Vitals:   10/13/14 1441  BP: 157/90  Pulse: 60  Height: 5' 9.5" (1.765 m)  Weight: 162 lb (73.483 kg)   Body mass index is 23.59 kg/(m^2).  Generalized: Well developed, in no acute distress   Neurological examination  Mentation: Alert.  Follows all commands speech and language fluent. MOCA 8/30 Cranial nerve II-XII: Pupils were equal round reactive to light. Extraocular movements were full, visual field were full on confrontational test. Facial sensation and strength were normal. Uvula tongue midline. Head turning and shoulder shrug  were normal and symmetric. Motor: The motor testing reveals 5 over 5 strength of all 4 extremities. Good symmetric motor tone is noted throughout.  Sensory: Sensory testing is intact to soft touch on all 4 extremities. No evidence of extinction is noted.  Coordination: Cerebellar testing reveals good finger-nose-finger and heel-to-shin bilaterally.  Gait and station: Gait is normal. Tandem gait is normal. Romberg is negative. No drift is seen.  Reflexes: Deep tendon reflexes are symmetric and normal bilaterally.    DIAGNOSTIC DATA (LABS, IMAGING, TESTING) - I reviewed patient records, labs,  notes, testing and imaging myself where available.   ASSESSMENT AND PLAN 74 y.o. year old male  has a past medical history of Dementia and Hypertension. here with:  1. Dementia- likely Alzheimer's disease  The patient's memory score has decreased slightly. Today his [facility] 8/30 was previously 10/30 at this time the patient will continue the Namenda and Aricept. I have spoke to the wife and the patient regarding discontinuing this medication in the future if his memory score continues to decline. The patient's wife verbalized understanding. If the patient's symptoms worsen or he develops new symptoms she she'll let us know. Otherwise he will follow-up in 6 months or sooner if needed.  I spent 15 minutes with the patient 50% of this time was spent counseling the patient and his wife on treatment of dementia.  Butch Penny, MSN, NP-C 10/13/2014, 2:45 PM [name] [address] [place], [place] [address] [phone]  Note: This document was prepared with digital dictation and possible smart phrase technology. Any  transcriptional errors that result from this process are unintentional.

## 2014-10-14 DIAGNOSIS — R3915 Urgency of urination: Secondary | ICD-10-CM | POA: Diagnosis not present

## 2014-10-21 DIAGNOSIS — R3915 Urgency of urination: Secondary | ICD-10-CM | POA: Diagnosis not present

## 2014-10-27 ENCOUNTER — Other Ambulatory Visit: Payer: Self-pay | Admitting: Neurology

## 2014-11-03 DIAGNOSIS — R350 Frequency of micturition: Secondary | ICD-10-CM | POA: Diagnosis not present

## 2014-11-03 DIAGNOSIS — R3915 Urgency of urination: Secondary | ICD-10-CM | POA: Diagnosis not present

## 2014-11-11 DIAGNOSIS — R3915 Urgency of urination: Secondary | ICD-10-CM | POA: Diagnosis not present

## 2014-11-11 DIAGNOSIS — R350 Frequency of micturition: Secondary | ICD-10-CM | POA: Diagnosis not present

## 2014-11-14 DIAGNOSIS — R350 Frequency of micturition: Secondary | ICD-10-CM | POA: Diagnosis not present

## 2014-11-15 ENCOUNTER — Emergency Department (HOSPITAL_COMMUNITY): Payer: Medicare Other

## 2014-11-15 ENCOUNTER — Encounter (HOSPITAL_COMMUNITY): Payer: Self-pay | Admitting: Emergency Medicine

## 2014-11-15 ENCOUNTER — Emergency Department (HOSPITAL_COMMUNITY)
Admission: EM | Admit: 2014-11-15 | Discharge: 2014-11-15 | Disposition: A | Discharge status: Home / Self Care | Payer: Medicare Other | Attending: Emergency Medicine | Admitting: Emergency Medicine

## 2014-11-15 DIAGNOSIS — I129 Hypertensive chronic kidney disease with stage 1 through stage 4 chronic kidney disease, or unspecified chronic kidney disease: Secondary | ICD-10-CM | POA: Insufficient documentation

## 2014-11-15 DIAGNOSIS — R05 Cough: Secondary | ICD-10-CM | POA: Diagnosis not present

## 2014-11-15 DIAGNOSIS — N189 Chronic kidney disease, unspecified: Secondary | ICD-10-CM | POA: Diagnosis not present

## 2014-11-15 DIAGNOSIS — Z792 Long term (current) use of antibiotics: Secondary | ICD-10-CM | POA: Diagnosis not present

## 2014-11-15 DIAGNOSIS — J40 Bronchitis, not specified as acute or chronic: Secondary | ICD-10-CM | POA: Insufficient documentation

## 2014-11-15 DIAGNOSIS — Z79899 Other long term (current) drug therapy: Secondary | ICD-10-CM | POA: Insufficient documentation

## 2014-11-15 DIAGNOSIS — R0602 Shortness of breath: Secondary | ICD-10-CM | POA: Diagnosis present

## 2014-11-15 DIAGNOSIS — F0390 Unspecified dementia without behavioral disturbance: Secondary | ICD-10-CM | POA: Insufficient documentation

## 2014-11-15 DIAGNOSIS — I10 Essential (primary) hypertension: Secondary | ICD-10-CM | POA: Diagnosis not present

## 2014-11-15 LAB — CBC WITH DIFFERENTIAL/PLATELET
BASOS PCT: 1 % (ref 0–1)
Basophils Absolute: 0.1 10*3/uL (ref 0.0–0.1)
EOS ABS: 0.1 10*3/uL (ref 0.0–0.7)
Eosinophils Relative: 2 % (ref 0–5)
HCT: 47.1 % (ref 39.0–52.0)
Hemoglobin: 14.6 g/dL (ref 13.0–17.0)
LYMPHS ABS: 2 10*3/uL (ref 0.7–4.0)
Lymphocytes Relative: 26 % (ref 12–46)
MCH: 27.2 pg (ref 26.0–34.0)
MCHC: 31 g/dL (ref 30.0–36.0)
MCV: 87.7 fL (ref 78.0–100.0)
MONO ABS: 0.7 10*3/uL (ref 0.1–1.0)
Monocytes Relative: 9 % (ref 3–12)
Neutro Abs: 4.9 10*3/uL (ref 1.7–7.7)
Neutrophils Relative %: 62 % (ref 43–77)
PLATELETS: 261 10*3/uL (ref 150–400)
RBC: 5.37 MIL/uL (ref 4.22–5.81)
RDW: 14.2 % (ref 11.5–15.5)
WBC: 7.8 10*3/uL (ref 4.0–10.5)

## 2014-11-15 LAB — COMPREHENSIVE METABOLIC PANEL
ALT: 30 U/L (ref 0–53)
AST: 40 U/L — ABNORMAL HIGH (ref 0–37)
Albumin: 3.5 g/dL (ref 3.5–5.2)
Alkaline Phosphatase: 40 U/L (ref 39–117)
Anion gap: 7 (ref 5–15)
BUN: 18 mg/dL (ref 6–23)
CALCIUM: 8.9 mg/dL (ref 8.4–10.5)
CO2: 29 mmol/L (ref 19–32)
CREATININE: 1.83 mg/dL — AB (ref 0.50–1.35)
Chloride: 102 mmol/L (ref 96–112)
GFR calc Af Amer: 40 mL/min — ABNORMAL LOW (ref >90)
GFR, EST NON AFRICAN AMERICAN: 34 mL/min — AB (ref >90)
Glucose, Bld: 97 mg/dL (ref 70–99)
Potassium: 4.3 mmol/L (ref 3.5–5.1)
Sodium: 138 mmol/L (ref 135–145)
Total Bilirubin: 0.5 mg/dL (ref 0.3–1.2)
Total Protein: 7.4 g/dL (ref 6.0–8.3)

## 2014-11-15 LAB — TROPONIN I: Troponin I: <0.03 ng/mL (ref <0.031)

## 2014-11-15 NOTE — ED Notes (Signed)
Patient is alert and orientedx4.  Patient was explained discharge instructions and they understood them with no questions.  The patient's wife, Jerilee HohMary Rouse-Degraffenriedt is taking the patient home.

## 2014-11-15 NOTE — ED Notes (Signed)
MD at bedside. 

## 2014-11-15 NOTE — ED Notes (Addendum)
Patient transported to X-ray 

## 2014-11-15 NOTE — ED Notes (Signed)
Pt.'s spouse reported  SOB with dry cough onset this week , seen by his PCP yesterday for the same complaints prescribed with oral antibiotic for URI . Denies fever or chills. Pt. has dementia .

## 2014-11-15 NOTE — ED Notes (Signed)
Family at bedside. 

## 2014-11-15 NOTE — ED Notes (Addendum)
Pt. returned from XR. 

## 2014-11-15 NOTE — ED Provider Notes (Signed)
CSN: [id number]     Arrival date & time 11/15/14  2017 History   First MD Initiated Contact with Patient 11/15/14 2134     Chief Complaint  Patient presents with  . Shortness of Breath  . Cough     (Consider location/radiation/quality/duration/timing/severity/associated sxs/prior Treatment) HPI Comments:  74 year old male with dementia history no other significant medical problems, no history of pneumonia or heart failure presents with cough and mild shortness of breath the past 3 days, patient started Cipro by primary doctor, no fevers. No weight gain or leg swelling. Symptoms intermittent. No sick contacts. History via wife due to dementia, patient had baseline dementia.  Patient is a 74 y.o. male presenting with shortness of breath and cough. The history is provided by the patient and the spouse.  Shortness of Breath Associated symptoms: cough   Cough Associated symptoms: shortness of breath     Past Medical History  Diagnosis Date  . Dementia   . Hypertension    Past Surgical History  Procedure Laterality Date  . No past surgeries     No family history on file. History  Substance Use Topics  . Smoking status: Never Smoker   . Smokeless tobacco: Never Used  . Alcohol Use: No    Review of Systems  Unable to perform ROS: Dementia  Respiratory: Positive for cough and shortness of breath.       Allergies  Review of patient's allergies indicates no known allergies.  Home Medications   Prior to Admission medications   Medication Sig Start Date End Date Taking? Authorizing Provider  amLODipine (NORVASC) 10 MG tablet Take 10 mg by mouth daily.   Yes Historical Provider, MD  ciprofloxacin (CIPRO) 250 MG tablet Take 250 mg by mouth 2 (two) times daily.   Yes Historical Provider, MD  finasteride (PROSCAR) 5 MG tablet Take 5 mg by mouth daily. 10/02/14  Yes Historical Provider, MD  NAMENDA XR 28 MG CP24 24 hr capsule TAKE ONE CAPSULE BY MOUTH EVERY DAY 10/27/14  Yes Megan P  Millikan, NP  valsartan (DIOVAN) 320 MG tablet Take 320 mg by mouth every morning. 10/19/14  Yes Historical Provider, MD  donepezil (ARICEPT) 23 MG TABS tablet TAKE 1 TABLET (23 MG TOTAL) BY MOUTH AT BEDTIME. Patient not taking: Reported on 11/15/2014 05/01/14   Ramond Marrow, DO   BP 149/84 mmHg  Pulse 65  Temp(Src) 98.2 F (36.8 C) (Oral)  Resp 25  Ht  (1.803 m)  Wt 161 lb (73.029 kg)  BMI 22.46 kg/m2  SpO2 99% Physical Exam  Constitutional: He is oriented to person, place, and time. He appears well-developed and well-nourished.  HENT:  Head: Normocephalic and atraumatic.  Eyes: Conjunctivae are normal. Right eye exhibits no discharge. Left eye exhibits no discharge.  Neck: Normal range of motion. Neck supple. No tracheal deviation present.  Cardiovascular: Normal rate and regular rhythm.   Pulmonary/Chest: Effort normal and breath sounds normal.  Abdominal: Soft. He exhibits no distension. There is no tenderness. There is no guarding.  Musculoskeletal: He exhibits no edema.  Neurological: He is alert and oriented to person, place, and time.  Skin: Skin is warm. No rash noted.  Psychiatric:   Pleasant dementia  Nursing note and vitals reviewed.   ED Course  Procedures (including critical care time) Labs Review Labs Reviewed  COMPREHENSIVE METABOLIC PANEL - Abnormal; Notable for the following:    Creatinine, Ser 1.83 (*)    AST 40 (*)    GFR calc  non Af Amer 34 (*)    GFR calc Af Amer 40 (*)    All other components within normal limits  CBC WITH DIFFERENTIAL/PLATELET  TROPONIN I    Imaging Review Dg Chest 2 View  11/15/2014   CLINICAL DATA:  Short of breath with cough.  Dementia.  EXAM: CHEST  2 VIEW  COMPARISON:  02/02/2014  FINDINGS: Midline trachea. Normal heart size and mediastinal contours for age. No pleural effusion or pneumothorax. Clear lungs.  IMPRESSION: No acute cardiopulmonary disease.   Electronically Signed   By: Jeronimo GreavesKyle  Talbot M.D.   On: 11/15/2014  21:47     EKG Interpretation   Date/Time:  Tuesday November 15 2014 20:29:48 EDT Ventricular Rate:  70 PR Interval:  126 QRS Duration: 88 QT Interval:  400 QTC Calculation: 432 R Axis:   39 Text Interpretation:  Normal sinus rhythm Normal ECG Confirmed by Lenn Volker   MD, Ravis Herne ([id number]) on 11/15/2014 10:13:00 PM      MDM   Final diagnoses:  Bronchitis  CRF (chronic renal failure), unspecified stage    patient presents with cough and mild shortness of breath. Lungs clear no respiratory difficulty, mild elevated blood pressure. Patient had baseline dementia.   Chest x-ray reviewed no acute findings. EKG no acute findings reviewed.  Results and differential diagnosis were discussed with the patient/parent/guardian. Close follow up outpatient was discussed, comfortable with the plan.   Medications - No data to display  Filed Vitals:   11/15/14 2200 11/15/14 2215 11/15/14 2230 11/15/14 2245  BP: 151/87 160/89 149/85 149/84  Pulse: 63 64 57 65  Temp:      TempSrc:      Resp: 13 27 21 25   Height:      Weight:      SpO2: 98% 100% 97% 99%    Final diagnoses:  Bronchitis  CRF (chronic renal failure), unspecified stage        Blane OharaJoshua Tanica Gaige, MD 11/15/14 2358

## 2014-11-15 NOTE — Discharge Instructions (Signed)
If you were given medicines take as directed.  If you are on coumadin or contraceptives realize their levels and effectiveness is altered by many different medicines.  If you have any reaction (rash, tongues swelling, other) to the medicines stop taking and see a physician.   Please follow up as directed and return to the ER or see a physician for new or worsening symptoms.  Thank you. Filed Vitals:   11/15/14 2031  BP: 160/90  Pulse: 66  Temp: 98.2 F (36.8 C)  TempSrc: Oral  Resp: 18  Height: 5\' 11"  (1.803 m)  Weight: 161 lb (73.029 kg)  SpO2: 97%

## 2014-11-22 DIAGNOSIS — R350 Frequency of micturition: Secondary | ICD-10-CM | POA: Diagnosis not present

## 2014-11-28 ENCOUNTER — Other Ambulatory Visit: Payer: Self-pay | Admitting: Neurology

## 2015-01-03 DIAGNOSIS — N401 Enlarged prostate with lower urinary tract symptoms: Secondary | ICD-10-CM | POA: Diagnosis not present

## 2015-01-03 DIAGNOSIS — N183 Chronic kidney disease, stage 3 (moderate): Secondary | ICD-10-CM | POA: Diagnosis not present

## 2015-01-03 DIAGNOSIS — R350 Frequency of micturition: Secondary | ICD-10-CM | POA: Diagnosis not present

## 2015-02-15 DIAGNOSIS — I10 Essential (primary) hypertension: Secondary | ICD-10-CM | POA: Diagnosis not present

## 2015-03-13 DIAGNOSIS — R972 Elevated prostate specific antigen [PSA]: Secondary | ICD-10-CM | POA: Diagnosis not present

## 2015-03-13 DIAGNOSIS — R350 Frequency of micturition: Secondary | ICD-10-CM | POA: Diagnosis not present

## 2015-03-13 DIAGNOSIS — N401 Enlarged prostate with lower urinary tract symptoms: Secondary | ICD-10-CM | POA: Diagnosis not present

## 2015-03-13 DIAGNOSIS — N183 Chronic kidney disease, stage 3 (moderate): Secondary | ICD-10-CM | POA: Diagnosis not present

## 2015-04-18 ENCOUNTER — Ambulatory Visit (INDEPENDENT_AMBULATORY_CARE_PROVIDER_SITE_OTHER): Payer: Medicare Other | Admitting: Adult Health

## 2015-04-18 ENCOUNTER — Encounter: Payer: Self-pay | Admitting: Adult Health

## 2015-04-18 VITALS — BP 152/87 | HR 66 | Ht 71.0 in | Wt 170.0 lb

## 2015-04-18 DIAGNOSIS — G309 Alzheimer's disease, unspecified: Secondary | ICD-10-CM

## 2015-04-18 DIAGNOSIS — F0280 Dementia in other diseases classified elsewhere without behavioral disturbance: Secondary | ICD-10-CM | POA: Diagnosis not present

## 2015-04-18 NOTE — Patient Instructions (Addendum)
Continue Aricept D/C [facility] [phone]

## 2015-04-18 NOTE — Progress Notes (Addendum)
PATIENT: Donald [name] DOB: 09-27-1940  REASON FOR VISIT: follow up Dementia HISTORY FROM: patient  HISTORY OF PRESENT ILLNESS: 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 He returns today for follow-up. The patient continues on Aricept 23 mg daily as well as Namenda XR  28 mg. The patient's wife feels that his memory has continued to decline. The patient is able to complete all ADLs independently.  Although it times he does  not do things in sequential order. He continues to live at home with his wife. The patient's wife denies any behavioral changes. She does state that the patient tends to get very paranoid if she is not around. He likes to know where she is at all times especially in the afternoons.  The patient has had trouble with dehydration. He also has BPH. The patient returns today for an evaluation. HISTORY  10/13/14: 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 He was last seen by Donald [name]. He returns today for follow-up. The patient is currently taking Aricept 23 mg daily as well as Namenda XR 28 mg. The patient reports that his memory has gotten better. His wife reports that his memory has gotten worse. The patient continues to complete ADLs independently. His wife states that occasionally he will not put on clean clothes. She states that he tends to lay his clothes out for him. The patient no longer operates a motor vehicle. The wife states the patient had to give up his job due to his memory. Wife now completes all the finances. Patient was having some hallucinations. His wife states that occasionally during the night he will talk in his sleep but denies the patient may imbalance. She states occasionally if he gets up to go the bathroom during the night he may get confused. She is normally able to tell him to go back to sleep and the patient calms down. She  reports that the patient continues to sleep a lot during the day. He is also seen by a urologist for BPH. She states currently they are checking the nerves in his bladder every week for 12 weeks. Other than this no new medical issues since last seen.   HISTORY 01/26/14 (Donald [name]): Donald [name] is a 74 y.o. male here as a follow up from Donald [name] for cognitive decline with prior visit on 10/2013. At that visit his Aricept was increased to 23mg . Minimal to no improvement noted with this. Continues to have visual and auditory hallucinations but wife notes this has decreased slightly since last visit. Wife notes that he can be easily redirected when he is having hallucinations. She notes that he sleeps a lot during the day, will watch a lot of TV. Notes he is not as active as he used to be. He is not currently driving. No difficulty eating, no choking. Wife notes he has been losing some weight, appetite is decreased. Has strong family history of memory loss.   Initial visit 05/2013: Started noticing around 4 years ago, has been getting progressively worse, increased decline in the past yar. Trouble with short term memory, trouble remembering conversations. Wife manages the finances, this changed 3 years ago. Continues driving, short distances. Wife has some concerns about his driving.No episodes of getting lost. Has good remote memory. No history of strokes or TIAs. Has sister with dementia. No tobacco or EtOH. 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 Has some nighttime agitation. No hallucinations.   Aricept and Namenda started around 3 to 4 years ago. Wife has not noticed much benefit.   Has had multiple syncopal episodes, following with Dr Donald [name], adjusting his blood pressure medication. Gets very diaphoretic during these episodes. No extremity shaking.    REVIEW OF SYSTEMS: Out of a complete 14 system review of symptoms, the patient complains  only of the following symptoms, and all other reviewed systems are negative.   see history of present illness  ALLERGIES: No Known Allergies  HOME MEDICATIONS: Outpatient Prescriptions Prior to Visit  Medication Sig Dispense Refill  . amLODipine (NORVASC) 10 MG tablet Take 10 mg by mouth daily.    . ciprofloxacin (CIPRO) 250 MG tablet Take 250 mg by mouth 2 (two) times daily.    Marland Kitchen donepezil (ARICEPT) 23 MG TABS tablet TAKE 1 TABLET (23 MG TOTAL) BY MOUTH AT BEDTIME. 30 tablet 6  . finasteride (PROSCAR) 5 MG tablet Take 5 mg by mouth daily.  11  . NAMENDA XR 28 MG CP24 24 hr capsule TAKE ONE CAPSULE BY MOUTH EVERY DAY 30 capsule 6  . valsartan (DIOVAN) 320 MG tablet Take 320 mg by mouth every morning.  12   No facility-administered medications prior to visit.    PAST MEDICAL HISTORY: Past Medical History  Diagnosis Date  . Dementia   . Hypertension     PAST SURGICAL HISTORY: Past Surgical History  Procedure Laterality Date  . No past surgeries      FAMILY HISTORY: History reviewed. No pertinent family history.  SOCIAL HISTORY: Social History   Social History  . Marital Status: Married    Spouse Name: Donald [name]  . Number of Children: 2  . Years of Education: college   Occupational History  . retired    Social History Main Topics  . Smoking status: Never Smoker   . Smokeless tobacco: Never Used  . Alcohol Use: No  . Drug Use: No  . Sexual Activity: Not on file   Other Topics Concern  . Not on file   Social History Narrative   Patient is married Donald [name]), has 2 children   Patient is right handed   Education level is college   Caffeine consumption is 0      PHYSICAL EXAM  Filed Vitals:   04/18/15 1506  BP: 152/87  Pulse: 66  Height:  (1.803 m)  Weight: 170 lb (77.111 kg)   Body mass index is 23.72 kg/(m^2).   MMSE - Mini Mental State Exam 04/18/2015  Orientation to time 0  Orientation to Place 3  Registration 3  Attention/ Calculation 0    Recall 0  Language- name 2 objects 2  Language- repeat 1  Language- follow 3 step command 3  Language- read & follow direction 1  Write a sentence 0  Copy design 1  Total score 14     Generalized: Well developed, in no acute distress   Neurological examination  Mentation: Alert. Follows all commands speech and language fluent. MMSE 14/30, MOCA 7/30. Cranial nerve II-XII: Pupils were equal round reactive to light. Extraocular movements were full, visual field were full on confrontational test. Facial sensation and strength were normal. Uvula tongue midline. Head turning and shoulder shrug  were normal and symmetric. Motor: The motor testing reveals 5 over 5 strength of all 4 extremities. Good symmetric motor tone is noted throughout.  Sensory: Sensory testing is intact to  soft touch on all 4 extremities. No evidence of extinction is noted.  Coordination: Cerebellar testing reveals good finger-nose-finger and heel-to-shin bilaterally.  Gait and station: Gait is normal.  Reflexes: Deep tendon reflexes are symmetric and normal bilaterally.   DIAGNOSTIC DATA (LABS, IMAGING, TESTING) - I reviewed patient records, labs, notes, testing and imaging myself where available.  Lab Results  Component Value Date   WBC 7.8 11/15/2014   HGB 14.6 11/15/2014   HCT 47.1 11/15/2014   MCV 87.7 11/15/2014   PLT 261 11/15/2014      Component Value Date/Time   NA 138 11/15/2014 2034   K 4.3 11/15/2014 2034   CL 102 11/15/2014 2034   CO2 29 11/15/2014 2034   GLUCOSE 97 11/15/2014 2034   BUN 18 11/15/2014 2034   CREATININE 1.83* 11/15/2014 2034   CALCIUM 8.9 11/15/2014 2034   PROT 7.4 11/15/2014 2034   ALBUMIN 3.5 11/15/2014 2034   AST 40* 11/15/2014 2034   ALT 30 11/15/2014 2034   ALKPHOS 40 11/15/2014 2034   BILITOT 0.5 11/15/2014 2034   GFRNONAA 34* 11/15/2014 2034   GFRAA 40* 11/15/2014 2034    ASSESSMENT AND PLAN 74 y.o. year old male  has a past medical history of Dementia and  Hypertension. here with:   1. Dementia    The patient's memory scores have continued to decline. MMSE is 14/30 and MOCA is 7/30. The patient will continue on Aricept. The patient's wife would like to discontinue Namenda as it is the most expensive drug. At this time she does not really seen the benefit of Aricept or Namenda. I am amenable to this. The wife  Is also requesting any type of recommendations for resources to help her in the home. I have provided her with a number of care patrol. If the patient's symptoms worsen or he develops new symptoms they should let us know. He will follow-up in 6 months with Dr. Lucia Gaskins..   I spent 25 minutes with the patient. 50% of this time was spent counseling the patient and his wife on disease progression as well as medication.   Butch Penny, MSN, NP-C 04/18/2015, 3:09 PM [name] [address] [place], [place] [address] [phone]   Personally participated in and made any corrections needed to history, physical, neuro exam,assessment and plan as stated above.  Naomie Dean, MD Stroke Neurology [phone] [name]    Personally  participated in and made any corrections needed to history, physical, neuro exam,assessment and plan as stated above.  Naomie Dean, MD [name]

## 2015-06-21 DIAGNOSIS — I10 Essential (primary) hypertension: Secondary | ICD-10-CM | POA: Diagnosis not present

## 2015-07-01 ENCOUNTER — Other Ambulatory Visit: Payer: Self-pay | Admitting: Adult Health

## 2015-10-09 ENCOUNTER — Other Ambulatory Visit: Payer: Self-pay | Admitting: Cardiology

## 2015-10-09 ENCOUNTER — Ambulatory Visit
Admission: RE | Admit: 2015-10-09 | Discharge: 2015-10-09 | Disposition: A | Discharge status: Home / Self Care | Payer: Medicare Other | Source: Ambulatory Visit | Attending: Cardiology | Admitting: Cardiology

## 2015-10-09 DIAGNOSIS — J189 Pneumonia, unspecified organism: Secondary | ICD-10-CM

## 2015-10-09 DIAGNOSIS — R05 Cough: Secondary | ICD-10-CM | POA: Diagnosis not present

## 2015-10-16 ENCOUNTER — Ambulatory Visit (INDEPENDENT_AMBULATORY_CARE_PROVIDER_SITE_OTHER): Payer: Medicare Other | Admitting: Neurology

## 2015-10-16 ENCOUNTER — Encounter: Payer: Self-pay | Admitting: Neurology

## 2015-10-16 VITALS — BP 157/84 | HR 68 | Ht 71.0 in | Wt 180.2 lb

## 2015-10-16 DIAGNOSIS — G309 Alzheimer's disease, unspecified: Principal | ICD-10-CM

## 2015-10-16 DIAGNOSIS — F0281 Dementia in other diseases classified elsewhere with behavioral disturbance: Secondary | ICD-10-CM

## 2015-10-16 DIAGNOSIS — G308 Other Alzheimer's disease: Secondary | ICD-10-CM | POA: Diagnosis not present

## 2015-10-16 NOTE — Progress Notes (Signed)
[name]    Provider:  Dr Lucia Gaskins Referring Provider: Donia Guiles, MD Primary Care Physician:  Pola Corn, MD  HISTORY OF PRESENT ILLNESS: 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 He was last seen by Dr. Hosie Poisson. He returns today for follow-up. The patient is currently taking Aricept 23 mg daily as well as Namenda XR 28 mg. The patient reports that his memory has gotten better. His wife reports that his memory has gotten worse. The patient continues to complete ADLs independently. His wife states that occasionally he will not put on clean clothes. She states that he tends to lay his clothes out for him. The patient no longer operates a motor vehicle. The wife states the patient had to give up his job due to his memory. Wife now completes all the finances. Patient was having some hallucinations. His wife states that occasionally during the night he will talk in his sleep but denies the patient may imbalance. She states occasionally if he gets up to go the bathroom during the night he may get confused. She is normally able to tell him to go back to sleep and the patient calms down. She reports that the patient continues to sleep a lot during the day. He is also seen by a urologist for BPH. She states currently they are checking the nerves in his bladder every week for 12 weeks. Other than this no new medical issues since last seen.  Interval history 10/16/2015: Dr. Lucia Gaskins. I am seeing patient today for the first time. Things are going well per patient. But not per wife. He is still on Aricept. Memory continues to decline. Lots of confusion expressibe himself. Started around 2010 and has been progressively slowly worsening. 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 Wife had noticed changes at home even before that. Trouble with short term memory, trouble remembering  conversations. He is not driving. Wife provides all information. He goes to a daycare center 5 days a week. Wife works 4 hours a day. They keep him very active and he is very happy there. He is wandering at night. She is able to redirect him into bed. During the day he will get defiant and not want to put on the depends. Right now she feels like she is ok. No delusions or hallucinations. [facility] [address].   HISTORY 01/26/14 ([place]): Donald [name] is a 75 y.o. male here as a follow up from Dr. Shana Chute for cognitive decline with prior visit on 10/2013. At that visit his Aricept was increased to . Minimal to no improvement noted with this. Continues to have visual and auditory hallucinations but wife notes this has decreased slightly since last visit. Wife notes that he can be easily redirected when he is having hallucinations. She notes that he sleeps a lot during the day, will watch a lot of TV. Notes he is not as active as he used to be. He is not currently driving. No difficulty eating, no choking. Wife notes he has been losing some weight, appetite is decreased. Has strong family history of memory loss.   Initial visit 05/2013: Started noticing around 4 years ago, has been getting progressively worse, increased decline in the past yar. Trouble with short term memory, trouble remembering conversations. Wife manages the finances, this changed 3 years ago. Continues driving, short distances. Wife has some concerns  about his driving.No episodes of getting lost. Has good remote memory. No history of strokes or TIAs. Has sister with dementia. No tobacco or EtOH. 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 Has some nighttime agitation. No hallucinations.   Aricept and Namenda started around 3 to 4 years ago. Wife has not noticed much benefit.   Has had multiple syncopal episodes, following with Dr Shana Chute, adjusting his  blood pressure medication. Gets very diaphoretic during these episodes. No extremity shaking.    Review of Systems: Patient complains of symptoms per HPI as well as the following symptoms: Frequency of urination, speech difficulty, passing out, confusion, frequent waking, snoring, acting out dreams. Pertinent negatives per HPI. All others negative.   Social History   Social History  . Marital Status: Married    Spouse Name: mary  . Number of Children: 2  . Years of Education: college   Occupational History  . retired    Social History Main Topics  . Smoking status: Never Smoker   . Smokeless tobacco: Never Used  . Alcohol Use: No  . Drug Use: No  . Sexual Activity: Not on file   Other Topics Concern  . Not on file   Social History Narrative   Patient is married [name]), has 2 children   Patient is right handed   Education level is college   Caffeine consumption is 0    History reviewed. No pertinent family history.  Past Medical History  Diagnosis Date  . Dementia   . Hypertension     Past Surgical History  Procedure Laterality Date  . No past surgeries      Current Outpatient Prescriptions  Medication Sig Dispense Refill  . amLODipine (NORVASC) 10 MG tablet Take 10 mg by mouth daily.    . benzonatate (TESSALON) 200 MG capsule Take 200 mg by mouth 3 (three) times daily as needed.  0  . donepezil (ARICEPT) 23 MG TABS tablet TAKE 1 TABLET BY MOUTH AT BEDTIME 30 tablet 3  . finasteride (PROSCAR) 5 MG tablet Take 5 mg by mouth daily.  11  . valsartan (DIOVAN) 320 MG tablet Take 320 mg by mouth every morning.  12   No current facility-administered medications for this visit.    Allergies as of 10/16/2015  . (No Known Allergies)    Vitals: BP 157/84 mmHg  Pulse 68  Ht  (1.803 m)  Wt 180 lb 3.2 oz (81.738 kg)  BMI 25.14 kg/m2 Last Weight:  Wt Readings from Last 1 Encounters:  10/16/15 180 lb 3.2 oz (81.738 kg)   Last Height:   Ht Readings from  Last 1 Encounters:  10/16/15  (1.803 m)   Neurological examination: Stable, no changes from previously documented exam.  Mentation: Alert. Follows all commands speech and language fluent. MOCA 8/30 at last appointment  MMSE - Mini Mental State Exam 04/18/2015  Orientation to time 0  Orientation to Place 3  Registration 3  Attention/ Calculation 0  Recall 0  Language- name 2 objects 2  Language- repeat 1  Language- follow 3 step command 3  Language- read & follow direction 1  Write a sentence 0  Copy design 1  Total score 14    Cranial nerve II-XII: Pupils were equal round reactive to light. Extraocular movements were full, visual field were full on confrontational test. Facial sensation and strength were normal. Uvula tongue midline. Head turning and shoulder shrug were normal and  symmetric. Motor: The motor testing reveals 5 over 5 strength of all 4 extremities. Good symmetric motor tone is noted throughout.  Sensory: Sensory testing is intact to soft touch on all 4 extremities. No evidence of extinction is noted.  Coordination: Cerebellar testing reveals good finger-nose-finger and heel-to-shin bilaterally.  Gait and station: Gait is normal. Tandem gait is normal. Romberg is negative. No drift is seen.  Reflexes: Deep tendon reflexes are symmetric and normal bilaterally.    Assessment/Plan:  75 y.o. year old male  has a past medical history of Dementia and Hypertension. Neurologic exam is nonfocal. Here with his wife who is his caretaker. here with:  1. Dementia- likely Alzheimer's disease 2. Continue Aricept. They decline Namenda.  Discussed Alzheimer's dementia and the natural progression of this disorder. Aricept and Namenda helps preserve memory but does not improve it and memory. This is a neurodegenerative disease and will continue to progress. The wife appears to be very knowledgeable.  Naomie DeanAntonia Kaleigha Chamberlin, MD  [facility] [address]  [address] [place], [place] [id number]  Phone [phone] Fax [phone]  A total of 30 minutes was spent face-to-face with this patient and his wife. Over half this time was spent on counseling patient on the Alzheimer's dementia  diagnosis and different diagnostic and therapeutic options available.

## 2015-10-16 NOTE — Patient Instructions (Signed)
Remember to drink plenty of fluid, eat healthy meals and do not skip any meals. Try to eat protein with a every meal and eat a healthy snack such as fruit or nuts in between meals. Try to keep a regular sleep-wake schedule and try to exercise daily, particularly in the form of walking, 20-30 minutes a day, if you can.   As far as your medications are concerned, I would like to suggest: continue current medicaion  I would like to see you back in 6 months, sooner if we need to. Please call us with any interim questions, concerns, problems, updates or refill requests.   Our phone number is [phone]. We also have an after hours call service for urgent matters and there is a physician on-call for urgent questions. For any emergencies you know to call 911 or go to the nearest emergency room

## 2015-10-29 ENCOUNTER — Other Ambulatory Visit: Payer: Self-pay

## 2015-10-29 ENCOUNTER — Observation Stay (HOSPITAL_COMMUNITY)
Admission: EM | Admit: 2015-10-29 | Discharge: 2015-10-30 | Disposition: A | Discharge status: Home / Self Care | Payer: Medicare Other | Attending: Internal Medicine | Admitting: Internal Medicine

## 2015-10-29 ENCOUNTER — Emergency Department (HOSPITAL_COMMUNITY): Payer: Medicare Other

## 2015-10-29 ENCOUNTER — Encounter (HOSPITAL_COMMUNITY): Payer: Self-pay | Admitting: *Deleted

## 2015-10-29 DIAGNOSIS — R410 Disorientation, unspecified: Secondary | ICD-10-CM

## 2015-10-29 DIAGNOSIS — R7989 Other specified abnormal findings of blood chemistry: Secondary | ICD-10-CM

## 2015-10-29 DIAGNOSIS — N183 Chronic kidney disease, stage 3 (moderate): Secondary | ICD-10-CM | POA: Diagnosis not present

## 2015-10-29 DIAGNOSIS — R0682 Tachypnea, not elsewhere classified: Secondary | ICD-10-CM | POA: Diagnosis not present

## 2015-10-29 DIAGNOSIS — Z66 Do not resuscitate: Secondary | ICD-10-CM | POA: Insufficient documentation

## 2015-10-29 DIAGNOSIS — R4182 Altered mental status, unspecified: Secondary | ICD-10-CM | POA: Diagnosis present

## 2015-10-29 DIAGNOSIS — I10 Essential (primary) hypertension: Secondary | ICD-10-CM

## 2015-10-29 DIAGNOSIS — I129 Hypertensive chronic kidney disease with stage 1 through stage 4 chronic kidney disease, or unspecified chronic kidney disease: Secondary | ICD-10-CM | POA: Diagnosis not present

## 2015-10-29 DIAGNOSIS — E860 Dehydration: Secondary | ICD-10-CM | POA: Diagnosis not present

## 2015-10-29 DIAGNOSIS — G9341 Metabolic encephalopathy: Secondary | ICD-10-CM | POA: Diagnosis not present

## 2015-10-29 DIAGNOSIS — N400 Enlarged prostate without lower urinary tract symptoms: Secondary | ICD-10-CM | POA: Insufficient documentation

## 2015-10-29 DIAGNOSIS — R740 Nonspecific elevation of levels of transaminase and lactic acid dehydrogenase [LDH]: Secondary | ICD-10-CM | POA: Insufficient documentation

## 2015-10-29 DIAGNOSIS — R402411 Glasgow coma scale score 13-15, in the field [EMT or ambulance]: Secondary | ICD-10-CM | POA: Diagnosis not present

## 2015-10-29 DIAGNOSIS — I739 Peripheral vascular disease, unspecified: Secondary | ICD-10-CM | POA: Insufficient documentation

## 2015-10-29 DIAGNOSIS — G9340 Encephalopathy, unspecified: Secondary | ICD-10-CM

## 2015-10-29 DIAGNOSIS — F0390 Unspecified dementia without behavioral disturbance: Secondary | ICD-10-CM | POA: Insufficient documentation

## 2015-10-29 LAB — CBC WITH DIFFERENTIAL/PLATELET
BASOS ABS: 0.1 10*3/uL (ref 0.0–0.1)
BASOS PCT: 1 %
EOS PCT: 2 %
Eosinophils Absolute: 0.2 10*3/uL (ref 0.0–0.7)
HEMATOCRIT: 49.2 % (ref 39.0–52.0)
Hemoglobin: 15.3 g/dL (ref 13.0–17.0)
LYMPHS PCT: 22 %
Lymphs Abs: 1.7 10*3/uL (ref 0.7–4.0)
MCH: 26.4 pg (ref 26.0–34.0)
MCHC: 31.1 g/dL (ref 30.0–36.0)
MCV: 85 fL (ref 78.0–100.0)
Monocytes Absolute: 0.6 10*3/uL (ref 0.1–1.0)
Monocytes Relative: 8 %
NEUTROS ABS: 5 10*3/uL (ref 1.7–7.7)
Neutrophils Relative %: 67 %
PLATELETS: 230 10*3/uL (ref 150–400)
RBC: 5.79 MIL/uL (ref 4.22–5.81)
RDW: 14.5 % (ref 11.5–15.5)
WBC: 7.5 10*3/uL (ref 4.0–10.5)

## 2015-10-29 LAB — COMPREHENSIVE METABOLIC PANEL
ALBUMIN: 3.4 g/dL — AB (ref 3.5–5.0)
ALT: 27 U/L (ref 17–63)
AST: 36 U/L (ref 15–41)
Alkaline Phosphatase: 39 U/L (ref 38–126)
Anion gap: 10 (ref 5–15)
BUN: 16 mg/dL (ref 6–20)
CHLORIDE: 105 mmol/L (ref 101–111)
CO2: 24 mmol/L (ref 22–32)
CREATININE: 1.57 mg/dL — AB (ref 0.61–1.24)
Calcium: 9.2 mg/dL (ref 8.9–10.3)
GFR calc Af Amer: 48 mL/min — ABNORMAL LOW (ref >60)
GFR, EST NON AFRICAN AMERICAN: 41 mL/min — AB (ref >60)
GLUCOSE: 107 mg/dL — AB (ref 65–99)
POTASSIUM: 4.5 mmol/L (ref 3.5–5.1)
Sodium: 139 mmol/L (ref 135–145)
Total Bilirubin: 0.8 mg/dL (ref 0.3–1.2)
Total Protein: 7.3 g/dL (ref 6.5–8.1)

## 2015-10-29 LAB — CBG MONITORING, ED: GLUCOSE-CAPILLARY: 92 mg/dL (ref 65–99)

## 2015-10-29 LAB — I-STAT CG4 LACTIC ACID, ED
Lactic Acid, Venous: 2.03 mmol/L (ref 0.5–2.0)
Lactic Acid, Venous: 2.92 mmol/L (ref 0.5–2.0)

## 2015-10-29 LAB — URINALYSIS, ROUTINE W REFLEX MICROSCOPIC
BILIRUBIN URINE: NEGATIVE
GLUCOSE, UA: NEGATIVE mg/dL
HGB URINE DIPSTICK: NEGATIVE
Ketones, ur: NEGATIVE mg/dL
Leukocytes, UA: NEGATIVE
Nitrite: NEGATIVE
PH: 5.5 (ref 5.0–8.0)
Protein, ur: NEGATIVE mg/dL
SPECIFIC GRAVITY, URINE: 1.013 (ref 1.005–1.030)

## 2015-10-29 LAB — LACTIC ACID, PLASMA
LACTIC ACID, VENOUS: 2.2 mmol/L — AB (ref 0.5–2.0)
Lactic Acid, Venous: 1.4 mmol/L (ref 0.5–2.0)

## 2015-10-29 MED ORDER — SODIUM CHLORIDE 0.9 % IV SOLN
INTRAVENOUS | Status: DC
  Administered 2015-10-29: 15:00:00 via INTRAVENOUS

## 2015-10-29 MED ORDER — ONDANSETRON HCL 4 MG/2ML IJ SOLN: 4.0000 mg | Freq: Four times a day (QID) | INTRAMUSCULAR | Status: DC | PRN

## 2015-10-29 MED ORDER — SODIUM CHLORIDE 0.9% FLUSH: 3.0000 mL | Freq: Two times a day (BID) | INTRAVENOUS | Status: DC

## 2015-10-29 MED ORDER — LORAZEPAM 0.5 MG PO TABS
0.5000 mg | ORAL_TABLET | Freq: Two times a day (BID) | ORAL | Status: DC | PRN
  Administered 2015-10-29: 0.5 mg via ORAL
  Filled 2015-10-29: qty 1

## 2015-10-29 MED ORDER — ACETAMINOPHEN 325 MG PO TABS: 650.0000 mg | ORAL_TABLET | Freq: Four times a day (QID) | ORAL | Status: DC | PRN

## 2015-10-29 MED ORDER — BISACODYL 5 MG PO TBEC: 5.0000 mg | DELAYED_RELEASE_TABLET | Freq: Every day | ORAL | Status: DC | PRN

## 2015-10-29 MED ORDER — SODIUM CHLORIDE 0.9 % IV BOLUS (SEPSIS)
500.0000 mL | Freq: Once | INTRAVENOUS | Status: AC
  Administered 2015-10-29: 500 mL via INTRAVENOUS

## 2015-10-29 MED ORDER — ENOXAPARIN SODIUM 40 MG/0.4ML ~~LOC~~ SOLN
40.0000 mg | SUBCUTANEOUS | Status: DC
  Administered 2015-10-29 – 2015-10-30 (×2): 40 mg via SUBCUTANEOUS
  Filled 2015-10-29 (×2): qty 0.4

## 2015-10-29 MED ORDER — ACETAMINOPHEN 650 MG RE SUPP: 650.0000 mg | Freq: Four times a day (QID) | RECTAL | Status: DC | PRN

## 2015-10-29 MED ORDER — HALOPERIDOL LACTATE 5 MG/ML IJ SOLN
2.0000 mg | Freq: Once | INTRAMUSCULAR | Status: AC
Start: 2015-10-29 — End: 2015-10-29
  Administered 2015-10-29: 2 mg via INTRAMUSCULAR
  Filled 2015-10-29: qty 1

## 2015-10-29 MED ORDER — ONDANSETRON HCL 4 MG PO TABS: 4.0000 mg | ORAL_TABLET | Freq: Four times a day (QID) | ORAL | Status: DC | PRN

## 2015-10-29 MED ORDER — POLYETHYLENE GLYCOL 3350 17 G PO PACK: 17.0000 g | PACK | Freq: Every day | ORAL | Status: DC | PRN

## 2015-10-29 NOTE — ED Notes (Signed)
Pt. Given Malaw[place]urkey sandwich, applesauce and orange juice.

## 2015-10-29 NOTE — ED Notes (Signed)
Pt returns from ct scan. 

## 2015-10-29 NOTE — H&P (Signed)
[facility] History and Physical  Donald [name]:[id number] DOB: 03-25-1941 DOA: 10/29/2015  Referring physician: Emergency Department PCP: Pola CornSPRUILL,[name] O, MD   CHIEF COMPLAINT:   Altered mental status changes.             HPI: Donald [name] is a 75 y.[name]. male with dementia, on treatment. Independent with ADLs until this am. Recent decline in memory and increasing confusion lately per Neuro office note 10/16/15  Per wife, patient speaks very little at home. He is not oriented to family members, place or time. He is able to perform ADLs however.  Patient's wife found him lying in bed on back with hands folded across his chest this morning around 6 AM. Patient was stiff, kept eyes closed and would not respond to wife. The wife called EMS, patient remained in this altered state until he was load into the ambulance. No loss of bowel or bladder function during this time. Prior to this morning patient was in his usual state of health. No recent physical complaints. No recent medication changes.  Wife has a difficult time getting patient to take PO fluids of any kind but his appetite is satisfactory.      ED workup:   Tachypneic at times, normal sats. Afebrile No acute findings on head CTscan. U/a unremarkable.  Lactic acid 2.03 initially, now 2.9 CBC normal  EKG:    Normal sinus rhythm Possible Left atrial enlargement Borderline ECG since last tracing no significant change Confirmed by Donald [name] ([id number]) on 10/29/2015 12:15:53 PM                   Medications  sodium chloride 0.9 % bolus 500 mL (0 mLs Intravenous Stopped 10/29/15 1037)    Review of Systems  Unable to perform ROS: dementia   Past Medical History  Diagnosis Date  . Dementia   . Hypertension    Past Surgical History  Procedure Laterality Date  . No past surgeries      SOCIAL HISTORY:  reports that he has never smoked. He has never used smokeless tobacco. He reports that he does  not drink alcohol or use illicit drugs. Lives:  At home with wife   Assistive devices:   None needed for ambulation.   No Known Allergies  Family History  Problem Relation Age of Onset  . Hypertension Mother   . Dementia Sister     Prior to Admission medications   Medication Sig Start Date End Date Taking? Authorizing Provider  amLODipine (NORVASC) 10 MG tablet Take 10 mg by mouth daily.    Historical Provider, MD  benzonatate (TESSALON) 200 MG capsule Take 200 mg by mouth 3 (three) times daily as needed. 10/09/15   Historical Provider, MD  donepezil (ARICEPT) 23 MG TABS tablet TAKE 1 TABLET BY MOUTH AT BEDTIME 07/02/15   Donald PennyMegan Millikan, NP  finasteride (PROSCAR) 5 MG tablet Take 5 mg by mouth daily. 10/02/14   Historical Provider, MD  valsartan (DIOVAN) 320 MG tablet Take 320 mg by mouth every morning. 10/19/14   Historical Provider, MD   PHYSICAL EXAM: Filed Vitals:   10/29/15 1130 10/29/15 1200 10/29/15 1215 10/29/15 1307  BP: 143/81 144/78 140/95 133/77  Pulse:      Temp:      TempSrc:      Resp: 26 23 18 22   SpO2:    100%    Wt Readings from Last 3 Encounters:  10/16/15 81.738 kg (180 lb 3.2 oz)  04/18/15  77.111 kg (170 lb)  11/15/14 73.029 kg (161 lb)    General:  well developed black male. Appears calm and comfortable Eyes: PER, normal lids, irises & conjunctiva ENT: grossly normal hearing, lips & tongue Neck: no LAD, no masses Cardiovascular: RRR, no murmurs. No LE edema.  Respiratory: Respirations even and unlabored. Normal respiratory effort. Lungs CTA bilaterally, no wheezes / rales .   Abdomen: soft, non-distended, non-tender, active bowel sounds. No obvious masses.  Skin: no rash seen on limited exam Musculoskeletal: grossly normal tone BUE/BLE Psychiatric: pleasant, cooperative but confused.Speech fluent and appropriate Neurologic: Pleasantly confused.Not oriented to place or time. Identifies wife         LABS ON ADMISSION:    Basic Metabolic  Panel:  Recent Labs Lab 10/29/15 0814  NA 139  K 4.5  CL 105  CO2 24  GLUCOSE 107*  BUN 16  CREATININE 1.57*  CALCIUM 9.2   Liver Function Tests:  Recent Labs Lab 10/29/15 0814  AST 36  ALT 27  ALKPHOS 39  BILITOT 0.8  PROT 7.3  ALBUMIN 3.4*    CBC:  Recent Labs Lab 10/29/15 0814  WBC 7.5  NEUTROABS 5.0  HGB 15.3  HCT 49.2  MCV 85.0  PLT 230    CREATININE: 1.57 mg/dL ABNORMAL (16/10/96 0454) Estimated creatinine clearance - 42.6 mL/min  Radiological Exams on Admission: Dg Chest 2 View  10/29/2015  CLINICAL DATA:  Altered mental status EXAM: CHEST  2 VIEW COMPARISON:  10/09/2015; 11/15/2014; 02/02/2014; 04/18/2013 FINDINGS: Grossly unchanged enlarged cardiac silhouette and mediastinal contours given persistently reduced lung volumes. Bilateral infrahilar heterogeneous opacities are unchanged and favored to represent atelectasis. No new focal airspace opacities. No pleural effusion pneumothorax. No evidence of edema. No acute osseous abnormalities. IMPRESSION: Similar findings of hypoventilation without acute cardiopulmonary disease. Electronically Signed   By: Simonne Come M.D.   On: 10/29/2015 08:04   Ct Head Wo Contrast  10/29/2015  CLINICAL DATA:  Altered mental status.  Dementia. EXAM: CT HEAD WITHOUT CONTRAST TECHNIQUE: Contiguous axial images were obtained from the base of the skull through the vertex without intravenous contrast. COMPARISON:  04/18/2013. FINDINGS: No evidence for acute infarction, hemorrhage, mass lesion, hydrocephalus, or extra-axial fluid. Generalized atrophy. Chronic microvascular ischemic change. Vascular calcification. No sinus or mastoid disease. Similar appearance priors. IMPRESSION: Atrophy and small vessel disease.  No acute intracranial findings. Electronically Signed   By: Elsie Stain M.D.   On: 10/29/2015 09:00    ASSESSMENT / PLAN   Acute encephalopathy, resolved.  Transient period of full body stiffness / and decreased  responsiveness to wife this am. No concurrent loss of bowel or bladder function. No history of seizures. Suspect dehydration contributing factor with improvement in mental status after IV fluids. -admit to Observation - Telemetry -trend lactic acid -Maintenance IVF -fall precautions  Elevated lactic acid. Low suspicion for sepsis. Unremarkable urinalysis and chest x-ray. Afebrile, normal white count. Vital signs stable. Unclear why the mild rise in lactic acid despite IVF bolus -continue to trend lactic acid levels.  Dementia. Performs ADLs independently with direction. On Aricept. Followed outpatient by Neurology.  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 Controlled.  -continue home Norvasc, Diovan  Enlarged prostate.  -continue home Proscar  CONSULTANTS:    None  Code Status: DNR -wife POA DVT Prophylaxis: Lovenox  Family Communication:  Wife (POA) in room. She understands and agrees with plan of treatment.  Disposition Plan: Discharge to home  in 24-48 hours   Time spent: 60 minutes Willette Cluster  NP [facility] Pager [phone]

## 2015-10-29 NOTE — Progress Notes (Signed)
Patient placed in 2 point soft wrist restraints. Patient ate and was toileted prior to initiation. Wife was notified. Will continue to monitor per protocol.

## 2015-10-29 NOTE — ED Notes (Signed)
Pt to ED from home by EMS c/o altered mental status. Wife was attempting to get pt dressed this morning when pt was unable to follow commands and was nonverbal. Hx of dementia. Was tested for a UTI two weeks ago which was normal. Pt alert

## 2015-10-29 NOTE — Progress Notes (Signed)
Donald [name] [id number] Admission Data: 10/29/2015 3:22 PM Attending Provider: Alberteen Samhristopher P Danford, MD  WUJ:WJXBJYN,WGNFAOPCP:Donald [name],Donald [name] Val Eagle, MD Consults/ Treatment Team:    Donald [name] is a 75 y.o. male patient admitted from ED awake, alert  & orientated  X 1,  DNR, VSS - Blood pressure 142/82, pulse 69, temperature 98.5 F (36.9 C), temperature source Oral, resp. rate 16, SpO2 100 %.no c/o shortness of breath, no c/o chest pain, no distress noted. Tele # 17 placed.  IV site WDL: SL.  Allergies:  No Known Allergies   Past Medical History  Diagnosis Date  . Dementia   . Hypertension       Pt orientation to unit, room and routine. Information packet given to patient/family and safety video watched.  Admission INP armband ID verified with patient/family, and in place. SR up x 2, fall risk assessment complete with Patient and family verbalizing understanding of risks associated with falls. Pt verbalizes an understanding of how to use the call bell and to call for help before getting out of bed.  Skin, clean-dry- intact without evidence of bruising, or skin tears.   No evidence of skin break down noted on exam.     Will cont to monitor and assist as needed.  Donald [name], Donald Chasen L, RN 10/29/2015 3:22 PM

## 2015-10-29 NOTE — Progress Notes (Signed)
Fluids discontinued. Pt. Takes off green mitts, Ripped IV out after it was wrapped and green mitts placed prior to ripping IV out. Taking in water po well. Will continue to encourage fluids. Pt. Brought to the nursing station to monitor. Will hold off on restraints at this time. MD aware of no IV access at this time. Will continue to monitor.

## 2015-10-29 NOTE — ED Notes (Signed)
Attempted report x1. 

## 2015-10-29 NOTE — ED Provider Notes (Signed)
CSN: [id number]     Arrival date & time 10/29/15  0711 History   First MD Initiated Contact with Patient 10/29/15 [phone]     Chief Complaint  Patient presents with  . Altered Mental Status     (Consider location/radiation/quality/duration/timing/severity/associated sxs/prior Treatment) HPI Comments: Patient with a history of dementia and hypertension presents with altered mental status. History is limited due to his dementia and confusion. Per EMS he was brought from home for increased confusion. Per report, his wife is attempting again just this morning and he wasn't able to follow commands. He wasn't verbalizing as he normally does. No other history is obtained due to his confusion. There is no family at bedside currently.  Patient is a 75 y.o. male presenting with altered mental status. The history is provided by the EMS personnel. The history is limited by the absence of a caregiver.  Altered Mental Status   Past Medical History  Diagnosis Date  . Dementia   . Hypertension    Past Surgical History  Procedure Laterality Date  . No past surgeries     No family history on file. Social History  Substance Use Topics  . Smoking status: Never Smoker   . Smokeless tobacco: Never Used  . Alcohol Use: No    Review of Systems  Unable to perform ROS: Dementia      Allergies  Review of patient's allergies indicates no known allergies.  Home Medications   Prior to Admission medications   Medication Sig Start Date End Date Taking? Authorizing Provider  amLODipine (NORVASC) 10 MG tablet Take 10 mg by mouth daily.    Historical Provider, MD  benzonatate (TESSALON) 200 MG capsule Take 200 mg by mouth 3 (three) times daily as needed. 10/09/15   Historical Provider, MD  donepezil (ARICEPT) 23 MG TABS tablet TAKE 1 TABLET BY MOUTH AT BEDTIME 07/02/15   Butch PennyMegan Millikan, NP  finasteride (PROSCAR) 5 MG tablet Take 5 mg by mouth daily. 10/02/14   Historical Provider, MD  valsartan (DIOVAN) 320  MG tablet Take 320 mg by mouth every morning. 10/19/14   Historical Provider, MD   BP 133/77 mmHg  Pulse 65  Temp(Src) 98.5 F (36.9 C) (Rectal)  Resp 22  SpO2 100% Physical Exam  Constitutional: He appears well-developed and well-nourished.  HENT:  Head: Normocephalic and atraumatic.  Eyes: Pupils are equal, round, and reactive to light.  Neck: Normal range of motion. Neck supple.  Cardiovascular: Normal rate, regular rhythm and normal heart sounds.   Pulmonary/Chest: Effort normal and breath sounds normal. No respiratory distress. He has no wheezes. He has no rales. He exhibits no tenderness.  Abdominal: Soft. Bowel sounds are normal. There is no tenderness. There is no rebound and no guarding.  Musculoskeletal: Normal range of motion. He exhibits no edema.  Lymphadenopathy:    He has no cervical adenopathy.  Neurological: He is alert.  Will answer questions, but is confused.  Knows his name and that he is in the hospital.  Moves all extremities symmetrically, no pronator drift, no facial drift  Skin: Skin is warm and dry. No rash noted.  Psychiatric: He has a normal mood and affect.    ED Course  Procedures (including critical care time) Labs Review Labs Reviewed  COMPREHENSIVE METABOLIC PANEL - Abnormal; Notable for the following:    Glucose, Bld 107 (*)    Creatinine, Ser 1.57 (*)    Albumin 3.4 (*)    GFR calc non Af Amer 41 (*)  GFR calc Af Amer 48 (*)    All other components within normal limits  I-STAT CG4 LACTIC ACID, ED - Abnormal; Notable for the following:    Lactic Acid, Venous 2.03 (*)    All other components within normal limits  I-STAT CG4 LACTIC ACID, ED - Abnormal; Notable for the following:    Lactic Acid, Venous 2.92 (*)    All other components within normal limits  CBC WITH DIFFERENTIAL/PLATELET  URINALYSIS, ROUTINE W REFLEX MICROSCOPIC (NOT AT [facility])  CBG MONITORING, ED    Imaging Review Dg Chest 2 View  10/29/2015  CLINICAL DATA:  Altered  mental status EXAM: CHEST  2 VIEW COMPARISON:  10/09/2015; 11/15/2014; 02/02/2014; 04/18/2013 FINDINGS: Grossly unchanged enlarged cardiac silhouette and mediastinal contours given persistently reduced lung volumes. Bilateral infrahilar heterogeneous opacities are unchanged and favored to represent atelectasis. No new focal airspace opacities. No pleural effusion pneumothorax. No evidence of edema. No acute osseous abnormalities. IMPRESSION: Similar findings of hypoventilation without acute cardiopulmonary disease. Electronically Signed   By: Simonne Come M.D.   On: 10/29/2015 08:04   Ct Head Wo Contrast  10/29/2015  CLINICAL DATA:  Altered mental status.  Dementia. EXAM: CT HEAD WITHOUT CONTRAST TECHNIQUE: Contiguous axial images were obtained from the base of the skull through the vertex without intravenous contrast. COMPARISON:  04/18/2013. FINDINGS: No evidence for acute infarction, hemorrhage, mass lesion, hydrocephalus, or extra-axial fluid. Generalized atrophy. Chronic microvascular ischemic change. Vascular calcification. No sinus or mastoid disease. Similar appearance priors. IMPRESSION: Atrophy and small vessel disease.  No acute intracranial findings. Electronically Signed   By: Elsie Stain M.D.   On: 10/29/2015 09:00   I have personally reviewed and evaluated these images and lab results as part of my medical decision-making.   EKG Interpretation   Date/Time:  Sunday October 29 2015 07:03:32 EDT Ventricular Rate:  65 PR Interval:  144 QRS Duration: 86 QT Interval:  412 QTC Calculation: 428 R Axis:   14 Text Interpretation:  Normal sinus rhythm Possible Left atrial enlargement  Borderline ECG since last tracing no significant change Confirmed by Keeli Roberg   MD, Ollis Daudelin ([id number]) on 10/29/2015 12:15:53 PM      MDM   Final diagnoses:  Confusion    Patient is a 65 rolled male with dementia. His wife states that she tried waking up this morning and he was very stiff. He wasn't able put  his clothes on as he normally does. He's not able to ambulate like he normally does. He's not communicating as he normally does. His ED evaluation is fairly unremarkable. His head CT doesn't show anything acute. His urinalysis is negative for infection. His other blood work is unremarkable other than he has a slight lactate elevation. He was given IV fluids and a repeat lactate is a little higher. He is afebrile on a rectal temp. I don't find any source of infection. His chest x-ray is negative. He doesn't have any other criteria for sepsis. His wife is really concerned that this is not his normal mental status. I will consult unassigned medicine to see if they can keep him overnight for observation.  I spoke to [place] with [facility] who will admit the pt to obs, telemetry.  Rolan Bucco, MD 10/29/15 1323

## 2015-10-30 ENCOUNTER — Observation Stay (HOSPITAL_COMMUNITY): Payer: Medicare Other

## 2015-10-30 DIAGNOSIS — G9340 Encephalopathy, unspecified: Secondary | ICD-10-CM | POA: Diagnosis not present

## 2015-10-30 DIAGNOSIS — R4182 Altered mental status, unspecified: Secondary | ICD-10-CM

## 2015-10-30 LAB — CBC
HCT: 48.7 % (ref 39.0–52.0)
Hemoglobin: 16 g/dL (ref 13.0–17.0)
MCH: 27.5 pg (ref 26.0–34.0)
MCHC: 32.9 g/dL (ref 30.0–36.0)
MCV: 83.8 fL (ref 78.0–100.0)
PLATELETS: 189 10*3/uL (ref 150–400)
RBC: 5.81 MIL/uL (ref 4.22–5.81)
RDW: 14.5 % (ref 11.5–15.5)
WBC: 7.1 10*3/uL (ref 4.0–10.5)

## 2015-10-30 LAB — BASIC METABOLIC PANEL
Anion gap: 11 (ref 5–15)
BUN: 15 mg/dL (ref 6–20)
CALCIUM: 9.1 mg/dL (ref 8.9–10.3)
CO2: 23 mmol/L (ref 22–32)
CREATININE: 1.42 mg/dL — AB (ref 0.61–1.24)
Chloride: 104 mmol/L (ref 101–111)
GFR, EST AFRICAN AMERICAN: 54 mL/min — AB (ref >60)
GFR, EST NON AFRICAN AMERICAN: 46 mL/min — AB (ref >60)
GLUCOSE: 91 mg/dL (ref 65–99)
Potassium: 4.2 mmol/L (ref 3.5–5.1)
Sodium: 138 mmol/L (ref 135–145)

## 2015-10-30 MED ORDER — UNABLE TO FIND: Status: DC

## 2015-10-30 NOTE — Care Management Note (Signed)
Case Management Note  Patient Details  Name: Mariel CraftFrederick D Godsil MRN: [id number] Date of Birth: 03/28/1941  Subjective/Objective:                 Patient in observation for AMS. Lives at home with wife. AMS is transient. Goes to enrichment center during the day.   Action/Plan:  Anticipate DC to home with wife, no CM needs identified.  Expected Discharge Date:                  Expected Discharge Plan:  Home/Self Care  In-House Referral:     Discharge planning Services  CM Consult  Post Acute Care Choice:  NA Choice offered to:     DME Arranged:    DME Agency:     HH Arranged:    HH Agency:     Status of Service:  Completed, signed off  Medicare Important Message Given:    Date Medicare IM Given:    Medicare IM give by:    Date Additional Medicare IM Given:    Additional Medicare Important Message give by:     If discussed at Long Length of Stay Meetings, dates discussed:    Additional Comments:  Lawerance SabalDebbie Latrica Clowers, RN 10/30/2015, 11:19 AM

## 2015-10-30 NOTE — Procedures (Signed)
ELECTROENCEPHALOGRAM REPORT  Date of Study: 10/30/2015  Patient's Name: Donald [name] MRN: [id number] Date of Birth: April 16, 1941  Referring Provider: Dr. Osvaldo ShipperGokul Krishnan  Clinical History: This is a 75 year old man with altered mental status, unresponsive  Medications: acetaminophen (TYLENOL) tablet 650 mg LORazepam (ATIVAN) tablet 0.5 mg  Technical Summary: A multichannel digital EEG recording measured by the international 10-20 system with electrodes applied with paste and impedances below 5000 ohms performed as portable with EKG monitoring in an awake patient.  Hyperventilation and photic stimulation were not performed.  The digital EEG was referentially recorded, reformatted, and digitally filtered in a variety of bipolar and referential montages for optimal display.   Description: The patient is awake and confused during the recording.  During maximal wakefulness, there is a symmetric, medium voltage 8 Hz posterior dominant rhythm that poorly attenuates to eye opening and eye closure. This is admixed with a moderate amount of diffuse 4-6 Hz theta slowing of the waking background.  Sleep architecture is not seen. Hyperventilation and photic stimulation were not performed. There were no epileptiform discharges or electrographic seizures seen.    EKG lead was unremarkable.  Impression: This awake EEG is abnormal due to mild diffuse slowing of the waking background.  Clinical Correlation of the above findings indicates diffuse cerebral dysfunction that is non-specific in etiology and can be seen with hypoxic/ischemic injury, toxic/metabolic encephalopathies, neurodegenerative disorders, or medication effect.  The absence of epileptiform discharges does not rule out a clinical diagnosis of epilepsy.  Clinical correlation is advised.   Patrcia DollyKaren Karlina Suares, M.D.

## 2015-10-30 NOTE — Progress Notes (Addendum)
[facility] PROGRESS NOTE  Donald [name] JWJ:[id number] DOB: 1941-07-21 DOA: 10/29/2015  PCP: Pola CornSPRUILL,[name] O, MD  Brief HPI: 75 year old man with dementia, found with unresponsive episode.Family describe that he was completely his normal self on morning of admission when wife went to wake him and he was "stiff" and unresponsive.Remained stiff when EMS arrived.They gave fluids en route and patient improved.  Past medical history:  Past Medical History  Diagnosis Date  . Dementia   . Hypertension     Consultants: None  Procedures: None  Antibiotics: None  Subjective: Patient is pleasantly confused. His wife is at the bedside. She thinks that he is back to his baseline.  Objective: Vital Signs  Filed Vitals:   10/29/15 1448 10/29/15 1700 10/29/15 2218 10/30/15 0554  BP: 142/82  136/78 137/83  Pulse: 69  71 71  Temp: 98.5 F (36.9 C)  98.4 F (36.9 C) 98.6 F (37 C)  TempSrc: Oral  Oral Oral  Resp: 16  16 18   Height:  5\' 10"  (1.778 m)    Weight:  83.462 kg (184 lb)    SpO2: 100%  99% 100%    Intake/Output Summary (Last 24 hours) at 10/30/15 1452 Last data filed at 10/30/15 0516  Gross per 24 hour  Intake    550 ml  Output    300 ml  Net    250 ml   Filed Weights   10/29/15 1700  Weight: 83.462 kg (184 lb)    General appearance: alert, cooperative, appears stated age and no distress Resp: clear to auscultation bilaterally Cardio: As far as to his normal regular. Systolic murmur appreciated over the precordium. No S3, S4. No rubs, murmurs, or bruit. No pedal edema. GI: soft, non-tender; bowel sounds normal; no masses,  no organomegaly Extremities: extremities normal, atraumatic, no cyanosis or edema He is awake, alert. Pleasantly confused. He recognizes his wife. No facial asymmetry. Motor strength equal bilateral upper and lower extremities.  Lab Results:  Basic Metabolic Panel:  Recent Labs Lab 10/29/15 0814 10/30/15 0503   NA 139 138  K 4.5 4.2  CL 105 104  CO2 24 23  GLUCOSE 107* 91  BUN 16 15  CREATININE 1.57* 1.42*  CALCIUM 9.2 9.1   Liver Function Tests:  Recent Labs Lab 10/29/15 0814  AST 36  ALT 27  ALKPHOS 39  BILITOT 0.8  PROT 7.3  ALBUMIN 3.4*   CBC:  Recent Labs Lab 10/29/15 0814 10/30/15 0503  WBC 7.5 7.1  NEUTROABS 5.0  --   HGB 15.3 16.0  HCT 49.2 48.7  MCV 85.0 83.8  PLT 230 189   CBG:  Recent Labs Lab 10/29/15 0843  GLUCAP 92    No results found for this or any previous visit (from the past 240 hour(s)).    Studies/Results: Dg Chest 2 View  10/29/2015  CLINICAL DATA:  Altered mental status EXAM: CHEST  2 VIEW COMPARISON:  10/09/2015; 11/15/2014; 02/02/2014; 04/18/2013 FINDINGS: Grossly unchanged enlarged cardiac silhouette and mediastinal contours given persistently reduced lung volumes. Bilateral infrahilar heterogeneous opacities are unchanged and favored to represent atelectasis. No new focal airspace opacities. No pleural effusion pneumothorax. No evidence of edema. No acute osseous abnormalities. IMPRESSION: Similar findings of hypoventilation without acute cardiopulmonary disease. Electronically Signed   By: Simonne ComeJohn  Watts M.D.   On: 10/29/2015 08:04   Ct Head Wo Contrast  10/29/2015  CLINICAL DATA:  Altered mental status.  Dementia. EXAM: CT HEAD WITHOUT CONTRAST TECHNIQUE: Contiguous axial images  were obtained from the base of the skull through the vertex without intravenous contrast. COMPARISON:  04/18/2013. FINDINGS: No evidence for acute infarction, hemorrhage, mass lesion, hydrocephalus, or extra-axial fluid. Generalized atrophy. Chronic microvascular ischemic change. Vascular calcification. No sinus or mastoid disease. Similar appearance priors. IMPRESSION: Atrophy and small vessel disease.  No acute intracranial findings. Electronically Signed   By: Elsie Stain M.D.   On: 10/29/2015 09:00    Medications:  Scheduled: . enoxaparin (LOVENOX) injection   40 mg Subcutaneous Q24H  . sodium chloride flush  3 mL Intravenous Q12H   Continuous:  ZOX:[id number] **OR** acetaminophen, bisacodyl, LORazepam, ondansetron **OR** ondansetron (ZOFRAN) IV, polyethylene glycol  Assessment/Plan:  Active Problems:   Altered mental status   Elevated lactic acid level   Encephalopathy acute   Enlarged prostate   Hypertension    Acute encephalopathy, resolved Patient experienced transient period of full body stiffness / and decreased responsiveness. No concurrent loss of bowel or bladder function. No history of seizures. Suspect dehydration contributing factor with improvement in mental status after IV fluids. CT head was unremarkable. Patient does not have any focal neurological deficits. EEG will be ordered. PT evaluation. Lactic acid levels have improved. Dementia could be contributing as well.  Elevated lactic acid Lactic acid levels have improved. UA and chest x-ray did not show any evidence for infection. He is afebrile. White count is normal. Elevated lactic acid, likely due to dehydration.  Advanced Dementia Performs ADLs independently with direction. On Aricept. Followed outpatient by Neurology.   CKD, stage 3 Avoid Nephrotoxic medications  Hypertension Controlled.   Enlarged prostate.  Continue home Proscar  DVT Prophylaxis: Lovenox    Code Status: DO NOT RESUSCITATE  Family Communication: Discussed with the patient's wife  Disposition Plan: Await EEG. Await PT evaluation      [facility]  [facility] Pager [phone] 10/30/2015, 2:52 PM  If 7PM-7AM, please contact night-coverage at [email or web address], password [facility]

## 2015-10-30 NOTE — Discharge Summary (Signed)
[facility]  Physician Discharge Summary   Patient ID: Donald [name] MRN: [id number] DOB/AGE: [birth year] 75 y.o.  Admit date: 10/29/2015 Discharge date: 10/30/2015  PCP: Pola Corn, MD  DISCHARGE DIAGNOSES:  Active Problems:   Altered mental status   Elevated lactic acid level   Encephalopathy acute   Enlarged prostate   Hypertension   RECOMMENDATIONS FOR OUTPATIENT FOLLOW UP: 1. Patient may need earlier follow-up with his neurologist if such episodes recur   DISCHARGE CONDITION: fair  Diet recommendation: As before  Filed Weights   10/29/15 1700  Weight: 83.462 kg (184 lb)    INITIAL HISTORY: 75 year old man with dementia, found with unresponsive episode.Family describe that he was completely his normal self on morning of admission when wife went to wake him and he was "stiff" and unresponsive.Remained stiff when EMS arrived.They gave fluids en route and patient improved.  Consultations:  None  Procedures: EEG "Impression: This awake EEG is abnormal due to mild diffuse slowing of the waking background."  HOSPITAL COURSE:   Acute encephalopathy, resolved Patient experienced transient period of full body stiffness / and decreased responsiveness. No concurrent loss of bowel or bladder function. No history of seizures. Suspect dehydration contributing factor with improvement in mental status after IV fluids. CT head was unremarkable. Patient does not have any focal neurological deficits. EEG did not show any epileptiform activity. Lactic acid levels have improved. Dementia could be contributing as well. Patient seen by physical therapy. Does not have any therapy needs. If such episodes recur, he may need to be seen by his neurologist earlier than his next scheduled appointment.  Elevated lactic acid Lactic acid levels have improved. UA and chest x-ray did not show any evidence for infection. He is afebrile. White count is normal. Elevated  lactic acid, likely due to dehydration.  Advanced Dementia Performs ADLs independently with direction. On Aricept. Followed outpatient by Neurology.   CKD, stage 3 Avoid Nephrotoxic medications  Hypertension Controlled.   Enlarged prostate.  Continue home Proscar  Stable. Workup has been completed. Discussed with his wife. Okay for discharge today.   PERTINENT LABS:  The results of significant diagnostics from this hospitalization (including imaging, microbiology, ancillary and laboratory) are listed below for reference.     Labs: Basic Metabolic Panel:  Recent Labs Lab 10/29/15 0814 10/30/15 0503  NA 139 138  K 4.5 4.2  CL 105 104  CO2 24 23  GLUCOSE 107* 91  BUN 16 15  CREATININE 1.57* 1.42*  CALCIUM 9.2 9.1   Liver Function Tests:  Recent Labs Lab 10/29/15 0814  AST 36  ALT 27  ALKPHOS 39  BILITOT 0.8  PROT 7.3  ALBUMIN 3.4*   CBC:  Recent Labs Lab 10/29/15 0814 10/30/15 0503  WBC 7.5 7.1  NEUTROABS 5.0  --   HGB 15.3 16.0  HCT 49.2 48.7  MCV 85.0 83.8  PLT 230 189   CBG:  Recent Labs Lab 10/29/15 0843  GLUCAP 92     IMAGING STUDIES Dg Chest 2 View  10/29/2015  CLINICAL DATA:  Altered mental status EXAM: CHEST  2 VIEW COMPARISON:  10/09/2015; 11/15/2014; 02/02/2014; 04/18/2013 FINDINGS: Grossly unchanged enlarged cardiac silhouette and mediastinal contours given persistently reduced lung volumes. Bilateral infrahilar heterogeneous opacities are unchanged and favored to represent atelectasis. No new focal airspace opacities. No pleural effusion pneumothorax. No evidence of edema. No acute osseous abnormalities. IMPRESSION: Similar findings of hypoventilation without acute cardiopulmonary disease. Electronically Signed   By: Holland Commons.D.  On: 10/29/2015 08:04   Dg Chest 2 View  10/09/2015  CLINICAL DATA:  Productive cough for few weeks EXAM: CHEST  2 VIEW COMPARISON:  11/15/2014 FINDINGS: Cardiac shadow is within normal limits. The  lungs are well aerated bilaterally. No focal infiltrate or sizable effusion is seen. No bony abnormality is noted. IMPRESSION: No acute abnormality noted. Electronically Signed   By: Alcide CleverMark  Lukens M.D.   On: 10/09/2015 15:53   Ct Head Wo Contrast  10/29/2015  CLINICAL DATA:  Altered mental status.  Dementia. EXAM: CT HEAD WITHOUT CONTRAST TECHNIQUE: Contiguous axial images were obtained from the base of the skull through the vertex without intravenous contrast. COMPARISON:  04/18/2013. FINDINGS: No evidence for acute infarction, hemorrhage, mass lesion, hydrocephalus, or extra-axial fluid. Generalized atrophy. Chronic microvascular ischemic change. Vascular calcification. No sinus or mastoid disease. Similar appearance priors. IMPRESSION: Atrophy and small vessel disease.  No acute intracranial findings. Electronically Signed   By: Elsie StainJohn T Curnes M.D.   On: 10/29/2015 09:00    DISCHARGE EXAMINATION: Filed Vitals:   10/29/15 1700 10/29/15 2218 10/30/15 0554 10/30/15 1540  BP:  136/78 137/83 136/78  Pulse:  71 71 70  Temp:  98.4 F (36.9 C) 98.6 F (37 C) 99.3 F (37.4 C)  TempSrc:  Oral Oral Oral  Resp:  16 18 17   Height: 5\' 10"  (1.778 m)     Weight: 83.462 kg (184 lb)     SpO2:  99% 100% 99%   General appearance: alert, Distracted, appears stated age and no distress Resp: clear to auscultation bilaterally Cardio: regular rate and rhythm, S1, S2 normal, no murmur, click, rub or gallop GI: soft, non-tender; bowel sounds normal; no masses,  no organomegaly Extremities: extremities normal, atraumatic, no cyanosis or edema  DISPOSITION: Home with wife  Discharge Instructions    Call MD for:  difficulty breathing, headache or visual disturbances    Complete by:  As directed      Call MD for:  extreme fatigue    Complete by:  As directed      Call MD for:  persistant dizziness or light-headedness    Complete by:  As directed      Call MD for:  persistant nausea and vomiting    Complete by:   As directed      Call MD for:  severe uncontrolled pain    Complete by:  As directed      Call MD for:  temperature >100.4    Complete by:  As directed      Discharge instructions    Complete by:  As directed   Please follow up with your PCP in 1 week. Call your neurologist if such episodes recur.  You were cared for by a hospitalist during your hospital stay. If you have any questions about your discharge medications or the care you received while you were in the hospital after you are discharged, you can call the unit and asked to speak with the hospitalist on call if the hospitalist that took care of you is not available. Once you are discharged, your primary care physician will handle any further medical issues. Please note that NO REFILLS for any discharge medications will be authorized once you are discharged, as it is imperative that you return to your primary care physician (or establish a relationship with a primary care physician if you do not have one) for your aftercare needs so that they can reassess your need for medications and monitor your lab values.  If you do not have a primary care physician, you can call [phone] for a physician referral.     Increase activity slowly    Complete by:  As directed            ALLERGIES: No Known Allergies   Current Discharge Medication List    START taking these medications   Details  UNABLE TO FIND To Whom It May Concern Mr. Casebolt was admitted to [facility] on 10/29/15 and discharged on 10/30/15. He may return to his usual activities from 10/31/15. Please call if you have any questions. Osvaldo Shipper, MD [facility] [phone] Qty: 1 each, Refills: 0      CONTINUE these medications which have NOT CHANGED   Details  amLODipine (NORVASC) 10 MG tablet Take 10 mg by mouth daily.    donepezil (ARICEPT) 23 MG TABS tablet TAKE 1 TABLET BY MOUTH AT BEDTIME Qty: 30 tablet, Refills: 3    finasteride (PROSCAR)  5 MG tablet Take 5 mg by mouth daily. Refills: 11    valsartan (DIOVAN) 320 MG tablet Take 320 mg by mouth daily.  Refills: 12       Follow-up Information    Follow up with Pola Corn, MD. Schedule an appointment as soon as possible for a visit in 1 week.   Specialty:  Cardiology   Why:  post hospitalization follow up   Contact information:   [address] [place] [address] [phone]       Go to Anson Fret, MD.   Specialty:  Neurology   Why:  if such episodes recur. Otheriwse keep regular appointment.   Contact information:   [address] [place] [address] [phone]       TOTAL DISCHARGE TIME: 35 minutes  [facility]  [facility] Pager [phone]  10/30/2015, 5:21 PM

## 2015-10-30 NOTE — Progress Notes (Signed)
Patient d/c home with wife left floor via wheelchair accompanied by staff no c/o pain or shortness of breath. Addelynn Batte, Kae HellerMiranda Lynn, RN

## 2015-10-30 NOTE — Progress Notes (Signed)
EEG completed; results pending.    

## 2015-10-30 NOTE — Care Management Obs Status (Signed)
MEDICARE OBSERVATION STATUS NOTIFICATION   Patient Details  Name: Donald [name] MRN: [id number] Date of Birth: 1941/02/03   Medicare Observation Status Notification Given:  Yes    Lawerance Sabalebbie Emorie Mcfate, RN 10/30/2015, 9:33 AM

## 2015-10-30 NOTE — Discharge Instructions (Signed)

## 2015-10-30 NOTE — Progress Notes (Signed)
PT Cancellation Note  Patient Details Name: Donald CraftFrederick D [name] MRN: [id number] DOB: 10-04-1940   Cancelled Treatment:    Reason Eval/Treat Not Completed: Patient at procedure or test/unavailable   [name] 10/30/2015, 2:56 PM [facility] [phone]

## 2015-10-30 NOTE — Progress Notes (Signed)
D/c instructions and medications reviewed/ and provided to  wife verbalized understandng.  Katrice Goel, Kae HellerMiranda Lynn, RN

## 2015-10-30 NOTE — Progress Notes (Signed)
Physical Therapy Evaluation and Discharge Patient Details Name: Donald [name] MRN: [id number] DOB: [birth year] Today's Date: 10/30/2015   History of Present Illness  75 year old man with dementia, found with unresponsive episode.. Pt with "stiffness" episode and called EMS. Pt given fluids with improvement and suspected dehydration. EEG with "diffuse cerebral dysfunction that is non-specific in etiology " PMHx- dementia    Clinical Impression  Patient evaluated by Physical Therapy with no further acute PT needs identified. Patient ambulates with no signs of imbalance. Requires supervision due to dementia. PT is signing off. Thank you for this referral.     Follow Up Recommendations No PT follow up    Equipment Recommendations  None recommended by PT    Recommendations for Other Services       Precautions / Restrictions Precautions Precautions: Other (comment) (dementia)      Mobility  Bed Mobility Overal bed mobility: Modified Independent             General bed mobility comments: slightly incr time for OOB and back into bed due to distractions  Transfers Overall transfer level: Independent Equipment used: None                Ambulation/Gait Ambulation/Gait assistance: Supervision Ambulation Distance (Feet): 200 Feet Assistive device: None Gait Pattern/deviations: WFL(Within Functional Limits)   Gait velocity interpretation: at or above normal speed for age/gender General Gait Details: supervision due to dementia;  pt with head turns, changes in direction, quick stop all without imbalance  Stairs            Wheelchair Mobility    Modified Rankin (Stroke Patients Only)       Balance Overall balance assessment: No apparent balance deficits (not formally assessed) (pt with dementia and difficulty following commands)                                           Pertinent Vitals/Pain Pain Assessment: Faces Faces Pain  Scale: No hurt    Home Living Family/patient expects to be discharged to:: Private residence Living Arrangements: Spouse/significant other               Additional Comments: family unable to provide information; no family present    Prior Function           Comments: appears he was independent with ambulation     Hand Dominance        Extremity/Trunk Assessment   Upper Extremity Assessment: Overall WFL for tasks assessed           Lower Extremity Assessment: Overall WFL for tasks assessed      Cervical / Trunk Assessment: Normal  Communication   Communication: No difficulties  Cognition Arousal/Alertness: Awake/alert Behavior During Therapy: Restless Overall Cognitive Status: No family/caregiver present to determine baseline cognitive functioning                      General Comments      Exercises        Assessment/Plan    PT Assessment Patent does not need any further PT services  PT Diagnosis Generalized weakness   PT Problem List    PT Treatment Interventions     PT Goals (Current goals can be found in the Care Plan section) Acute Rehab PT Goals Patient Stated Goal: unable PT Goal Formulation: Patient unable to participate in goal  setting    Frequency     Barriers to discharge        Co-evaluation               End of Session Equipment Utilized During Treatment: Gait belt Activity Tolerance: Patient tolerated treatment well Patient left: in bed;with call bell/phone within reach;with bed alarm set      Functional Assessment Tool Used: clinical judgement Functional Limitation: Mobility: Walking and moving around Mobility: Walking and Moving Around Current Status (Z6109): At least 1 percent but less than 20 percent impaired, limited or restricted Mobility: Walking and Moving Around Goal Status [phone]): At least 1 percent but less than 20 percent impaired, limited or restricted Mobility: Walking and Moving Around  Discharge Status [phone]): At least 1 percent but less than 20 percent impaired, limited or restricted    Time: 9147-8295 PT Time Calculation (min) (ACUTE ONLY): 11 min   Charges:   PT Evaluation $PT Eval Low Complexity: 1 Procedure     PT G Codes:   PT G-Codes **NOT FOR INPATIENT CLASS** Functional Assessment Tool Used: clinical judgement Functional Limitation: Mobility: Walking and moving around Mobility: Walking and Moving Around Current Status (A2130): At least 1 percent but less than 20 percent impaired, limited or restricted Mobility: Walking and Moving Around Goal Status [phone]): At least 1 percent but less than 20 percent impaired, limited or restricted Mobility: Walking and Moving Around Discharge Status [phone]): At least 1 percent but less than 20 percent impaired, limited or restricted    Donald [name] 10/30/2015, 4:35 PM  Pager [phone]

## 2015-10-31 ENCOUNTER — Ambulatory Visit: Payer: Medicare Other | Admitting: Neurology

## 2015-10-31 DIAGNOSIS — J189 Pneumonia, unspecified organism: Secondary | ICD-10-CM | POA: Diagnosis not present

## 2015-11-07 ENCOUNTER — Other Ambulatory Visit: Payer: Self-pay | Admitting: Adult Health

## 2015-12-14 ENCOUNTER — Ambulatory Visit (INDEPENDENT_AMBULATORY_CARE_PROVIDER_SITE_OTHER): Payer: Medicare Other | Admitting: Neurology

## 2015-12-14 VITALS — BP 158/85 | HR 63 | Ht 70.0 in | Wt 182.0 lb

## 2015-12-14 DIAGNOSIS — G40409 Other generalized epilepsy and epileptic syndromes, not intractable, without status epilepticus: Secondary | ICD-10-CM | POA: Diagnosis not present

## 2015-12-14 DIAGNOSIS — F03918 Unspecified dementia, unspecified severity, with other behavioral disturbance: Secondary | ICD-10-CM

## 2015-12-14 DIAGNOSIS — F0391 Unspecified dementia with behavioral disturbance: Secondary | ICD-10-CM | POA: Diagnosis not present

## 2015-12-14 DIAGNOSIS — G4089 Other seizures: Secondary | ICD-10-CM

## 2015-12-14 MED ORDER — DIVALPROEX SODIUM ER 500 MG PO TB24: 500.0000 mg | ORAL_TABLET | Freq: Two times a day (BID) | ORAL | Status: DC

## 2015-12-14 NOTE — Progress Notes (Signed)
[name]    Provider: Dr Donald [name] Referring Provider: Donia Guiles, MD Primary Care Physician: Donald Corn, MD  Interval history 12/14/2015: Wife found him, he was stiff. Eye closed. Arms folded over his chest, couldn;t pull them apart, fingers intertwined and folded on his chest. He was still stiff on the stretcher. Not talking. Eyes closed. No urination or defecation. Donald [name] Kitchen He was confused all day Sunday. EEG showed nonspecific slowing. The rigidity lasted 5-10 minutes. He did not urinate or defecate. He was confused the rest of the day in the ED, he was not back to baseline. She thought he was dead. His lactate was elevated without other significant changes in BMP. Even EMS had a very difficult time putting him on the stricture due to his stiffness. CT of the head showed no acute processes or events.  HISTORY OF PRESENT ILLNESS: 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 He was last seen by Dr. Hosie [name]. He returns today for follow-up. The patient is currently taking Aricept 23 mg daily as well as Namenda XR 28 mg. The patient reports that his memory has gotten better. His wife reports that his memory has gotten worse. The patient continues to complete ADLs independently. His wife states that occasionally he will not put on clean clothes. She states that he tends to lay his clothes out for him. The patient no longer operates a motor vehicle. The wife states the patient had to give up his job due to his memory. Wife now completes all the finances. Patient was having some hallucinations. His wife states that occasionally during the night he will talk in his sleep but denies the patient may imbalance. She states occasionally if he gets up to go the bathroom during the night he may get confused. She is normally able to tell him to go back to sleep and the patient calms down. She reports that the patient continues to sleep a  lot during the day. He is also seen by a urologist for BPH. She states currently they are checking the nerves in his bladder every week for 12 weeks. Other than this no new medical issues since last seen.  Interval history 10/16/2015: Dr. Lucia [name]. I am seeing patient today for the first time. Things are going well per patient. But not per wife. He is still on Aricept. Memory continues to decline. Lots of confusion expressibe himself. Started around 2010 and has been progressively slowly worsening. 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 Wife had noticed changes at home even before that. Trouble with short term memory, trouble remembering conversations. He is not driving. Wife provides all information. He goes to a daycare center 5 days a week. Wife works 4 hours a day. They keep him very active and he is very happy there. He is wandering at night. She is able to redirect him into bed. During the day he will get defiant and not want to put on the depends. Right now she feels like she is ok. No delusions or hallucinations. [facility] [address].   HISTORY 01/26/14 (SUMNER): Donald [name] is a 75 y.o. male here as a follow up from Dr. Shana [name] for cognitive decline with prior visit on 10/2013. At that visit his Aricept was increased to . Minimal to no improvement noted with this. Continues to have visual and auditory hallucinations but wife notes this  has decreased slightly since last visit. Wife notes that he can be easily redirected when he is having hallucinations. She notes that he sleeps a lot during the day, will watch a lot of TV. Notes he is not as active as he used to be. He is not currently driving. No difficulty eating, no choking. Wife notes he has been losing some weight, appetite is decreased. Has strong family history of memory loss.   Initial visit 05/2013: Started noticing around 4 years ago, has been  getting progressively worse, increased decline in the past yar. Trouble with short term memory, trouble remembering conversations. Wife manages the finances, this changed 3 years ago. Continues driving, short distances. Wife has some concerns about his driving.No episodes of getting lost. Has good remote memory. No history of strokes or TIAs. Has sister with dementia. No tobacco or EtOH. 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 Has some nighttime agitation. No hallucinations.   Aricept and Namenda started around 3 to 4 years ago. Wife has not noticed much benefit.   Has had multiple syncopal episodes, following with Dr Donald [name], adjusting his blood pressure medication. Gets very diaphoretic during these episodes. No extremity shaking.    Review of Systems: Patient complains of symptoms per HPI as well as the following symptoms: Frequency of urination, speech difficulty, passing out, confusion, frequent waking, snoring, acting out dreams. Pertinent negatives per HPI. All others negative.   Social History   Social History  . Marital Status: Married    Spouse Name: Donald [name]  . Number of Children: 2  . Years of Education: college   Occupational History  . retired    Social History Main Topics  . Smoking status: Never Smoker   . Smokeless tobacco: Never Used  . Alcohol Use: No  . Drug Use: No  . Sexual Activity: Not on file   Other Topics Concern  . Not on file   Social History Narrative   Patient is married Donald [name]), has 2 children   Patient is right handed   Education level is college   Caffeine consumption is 0    Family History  Problem Relation Age of Onset  . Hypertension Mother   . Dementia Sister     Past Medical History  Diagnosis Date  . Dementia   . Hypertension     Past Surgical History  Procedure Laterality Date  . No past surgeries      Current Outpatient Prescriptions  Medication Sig Dispense Refill  .  amLODipine (NORVASC) 10 MG tablet Take 10 mg by mouth daily.    Donald [name] Kitchen donepezil (ARICEPT) 23 MG TABS tablet TAKE 1 TABLET BY MOUTH AT BEDTIME 30 tablet 11  . finasteride (PROSCAR) 5 MG tablet Take 5 mg by mouth daily.  11  . UNABLE TO FIND To Whom It May Concern Donald [name] was admitted to [facility] on 10/29/15 and discharged on 10/30/15. He may return to his usual activities from 10/31/15. Please call if you have any questions. Osvaldo Shipper, MD [facility] [phone] 1 each 0  . valsartan (DIOVAN) 320 MG tablet Take 320 mg by mouth daily.   12  . divalproex (DEPAKOTE ER) 500 MG 24 hr tablet Take 1 tablet (500 mg total) by mouth 2 (two) times daily. 60 tablet 12   No current facility-administered medications for this visit.    Allergies as of 12/14/2015  . (No Known Allergies)    Vitals: BP 158/85  mmHg  Pulse 63  Ht 5\' 10"  (1.778 m)  Wt 182 lb (82.555 kg)  BMI 26.11 kg/m2 Last Weight:  Wt Readings from Last 1 Encounters:  12/14/15 182 lb (82.555 kg)   Last Height:   Ht Readings from Last 1 Encounters:  12/14/15 5\' 10"  (1.778 m)     Cranial nerve II-XII: Pupils were equal round reactive to light. Extraocular movements were full, visual field were full on confrontational test. Facial sensation and strength were normal. Uvula tongue midline. Head turning and shoulder shrug were normal and symmetric. Motor: The motor testing reveals 5 over 5 strength of all 4 extremities. Good symmetric motor tone is noted throughout.  Sensory: Sensory testing is intact to soft touch on all 4 extremities. No evidence of extinction is noted.  Coordination: Cerebellar testing reveals good finger-nose-finger and heel-to-shin bilaterally.  Gait and station: Gait is normal. Tandem gait is normal. Romberg is negative. No drift is seen.  Reflexes: Deep tendon reflexes are symmetric and normal bilaterally.    Assessment/Plan: 75 y.o. year old male has a past medical history  of Dementia and Hypertension. Neurologic exam is nonfocal. Here with his wife who is his caretaker. He was admitted to the hospital recently for an episode of severe stiffness, followed by many hours of confusion, elevated lactate in the emergency without significant BMP changes to indicate dehydration or other cause, no UTI: EEG showed nonspecific slowing possibly postictal changes are due to his dementia. Cannot rule out the possibility of seizures. We'll start Depakote outpatient, this may also help with his behavioral problems due to his dementia as well.  1. 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 Continue Aricept. They decline Namenda. 3. Start depakote for possible seizure event which may help with mood and behavior as well  Discussed Alzheimer's dementia and the natural progression of this disorder. Aricept and Namenda helps preserve memory but does not improve it and memory. This is a neurodegenerative disease and will continue to progress. The wife appears to be very knowledgeable.  Will start Depakote. Discussed the most common side effects of Depakote including serious reactions which can include hepatotoxicity, pancreatitis, hyponatremia, pancytopenia, thrombocytopenia and patient's wife denies any history of liver disease or electrolyte imbalances. She is to stop for any concerning symptoms especially rash, suicidality, psychosis and hallucinations., And reactions include headache, nausea vomiting, somnolence, thrombocytopenia, dyspepsia, dizziness, diarrhea, abdominal pain, tremor, alopecia, weight changes, appetite changes, constipation and other side effects. Please can stop for anything concerning.  Donald DeanAntonia Selestino Nila, MD  [facility] [address] [place] [id number]  Phone [phone] Fax [phone]  A total of 30 minutes was spent face-to-face with this patient. Over half this time was spent on  counseling patient on the dementia and seizure diagnosis and different diagnostic and therapeutic options available.

## 2015-12-14 NOTE — Patient Instructions (Signed)
Remember to drink plenty of fluid, eat healthy meals and do not skip any meals. Try to eat protein with a every meal and eat a healthy snack such as fruit or nuts in between meals. Try to keep a regular sleep-wake schedule and try to exercise daily, particularly in the form of walking, 20-30 minutes a day, if you can.   As far as your medications are concerned, I would like to suggest: Start 1 pill of Depakote at night. In 2 weeks add the second pill.   As far as diagnostic testing: repeat EEG  I would like to see you back in 3 months, sooner if we need to. Please call us with any interim questions, concerns, problems, updates or refill requests.   Please also call us for any test results so we can go over those with you on the phone.  My clinical assistant and will answer any of your questions and relay your messages to me and also relay most of my messages to you.   Our phone number is [phone]. We also have an after hours call service for urgent matters and there is a physician on-call for urgent questions. For any emergencies you know to call 911 or go to the nearest emergency room

## 2015-12-15 DIAGNOSIS — F0391 Unspecified dementia with behavioral disturbance: Secondary | ICD-10-CM | POA: Insufficient documentation

## 2015-12-15 DIAGNOSIS — G4089 Other seizures: Secondary | ICD-10-CM | POA: Insufficient documentation

## 2015-12-15 DIAGNOSIS — F03918 Unspecified dementia, unspecified severity, with other behavioral disturbance: Secondary | ICD-10-CM | POA: Insufficient documentation

## 2016-01-09 ENCOUNTER — Other Ambulatory Visit: Payer: Medicare Other

## 2016-01-10 ENCOUNTER — Encounter: Payer: Self-pay | Admitting: Neurology

## 2016-01-26 ENCOUNTER — Telehealth: Payer: Self-pay | Admitting: Neurology

## 2016-01-26 NOTE — Telephone Encounter (Signed)
Pts wife called asking for medication to help calm pt down. States he goes to an adult daycare center and is acting out there. Please call and advise

## 2016-01-29 ENCOUNTER — Other Ambulatory Visit: Payer: Self-pay | Admitting: Neurology

## 2016-01-29 MED ORDER — MIRTAZAPINE 15 MG PO TABS: 15.0000 mg | ORAL_TABLET | Freq: Every day | ORAL | Status: DC

## 2016-01-29 NOTE — Telephone Encounter (Signed)
Kara Meadmma, can you call wife and get details please, how is he acting out? thanks

## 2016-01-29 NOTE — Telephone Encounter (Signed)
Spoke to wife, he is not sleeping well, he is pacing, he says he is going to leave his wife. At the daycare center he has been irritable. Not sleeping well at night. Days and nights are confused. Will try remeron, may help with sleep as well as mood. Watch for side effects including somnolence, dry mouth, dizziness, constipation and serious side effects like incr risk of falls, low white blood cell, hypotention, suicidality, mania, seizures, low sodium. This is not a complete list. Stop for anything concerning. Wife understands.

## 2016-01-29 NOTE — Telephone Encounter (Signed)
Dr Lucia GaskinsAhern- please advise [facility]. She states he has been acting irritable the last 3-4 days at adult day care, even at home. One of the secretary personal at the adult day care center was telling her that he does not want to come back inside and gets upset. She states he is saying "I am leaving". He is also up at night [facility]. She thinks he gets about 4 hours of sleep. She is worried he is going to set the alarm off, so she is up with him trying to keep him calm. Advised Dr Lucia GaskinsAhern seeing pt right now and I will speak to her and call back to advise. She verbalized understanding.

## 2016-02-01 ENCOUNTER — Telehealth: Payer: Self-pay

## 2016-02-01 NOTE — Telephone Encounter (Signed)
I spoke to pt's wife, [name], per DPR. I advised her that pt's appt on 04/17/16 with Aundra MilletMegan, NP needs to be rescheduled. Pt's wife is agreeable to coming in on 04/16/16 at 3:30pm. Pt's wife verbalized understanding.

## 2016-02-27 ENCOUNTER — Telehealth: Payer: Self-pay | Admitting: Neurology

## 2016-02-27 ENCOUNTER — Other Ambulatory Visit: Payer: Medicare Other

## 2016-02-27 NOTE — Telephone Encounter (Signed)
Wife called to cancel  2:30pm EEG today, states she just got out of hospital Sunday, she is husband's only transportation and she is not feeling well enough to drive. States she has to get husband from [facility] [address] to bring and she is not feeling well enough to do this today. Wife advised of 3 no show policy and this was a reschedule from previous no show. Wife was going to go ahead and try to come to appointment, due to safety of patient and wife, appointment cancelled, appointment noted.

## 2016-02-27 NOTE — Telephone Encounter (Signed)
Dr Ahern- FYI 

## 2016-02-28 ENCOUNTER — Encounter: Payer: Self-pay | Admitting: Neurology

## 2016-03-18 ENCOUNTER — Encounter: Payer: Self-pay | Admitting: Neurology

## 2016-03-18 ENCOUNTER — Ambulatory Visit (INDEPENDENT_AMBULATORY_CARE_PROVIDER_SITE_OTHER): Payer: Medicare Other | Admitting: Neurology

## 2016-03-18 VITALS — BP 165/90 | HR 67 | Ht 70.0 in | Wt 181.4 lb

## 2016-03-18 DIAGNOSIS — F0391 Unspecified dementia with behavioral disturbance: Secondary | ICD-10-CM

## 2016-03-18 DIAGNOSIS — F03918 Unspecified dementia, unspecified severity, with other behavioral disturbance: Secondary | ICD-10-CM

## 2016-03-18 NOTE — Patient Instructions (Signed)
Recommend a book: The 36-hour day: Caring for people with dementia  I would like to see you back in 6 months, sooner if we need to. Please call us with any interim questions, concerns, problems, updates or refill requests.   Our phone number is [phone]. We also have an after hours call service for urgent matters and there is a physician on-call for urgent questions. For any emergencies you know to call 911 or go to the nearest emergency room

## 2016-03-18 NOTE — Progress Notes (Signed)
[facility]    Provider:  Dr Donald Donald [name] Referring Provider: Donia Donald [name], [name], MD Primary Care Physician:  Donald CornSPRUILL,Donald O, MD  Interval history 03/18/2016: More agitated, sleep walking. Donald Donald [name] naps during Donald day then Donald Donald [name] is very sleepy during Donald day, Donald Donald [name] will swing at her if she tries to stop Donald Donald [name] from sleeping. Donald Donald [name] is going to an adult daycare. Not aside effect of Donald medication. Donald Donald [name] is acting out at Donald center as well. Lots of agitation. Depakote has been helping with Donald agitation. We can increase Donald medication if needed. Donald Donald [name] tries to get up, Donald Donald [name] gets up every night, Donald Donald [name] redirects Donald Donald [name]. Some nights Donald Donald [name] sleeps well. Other nights Donald Donald [name] does not. Donald Donald [name] goes to be at 6-630 and I suggest Donald Donald [name] goes to sleep later instead. No significant snoring at night, not stopping breathing. Memory worsening. Better when Donald Donald [name] is at Donald adult center in Donald day.   Interval history 12/14/2015: Donald Donald [name] found Donald Donald [name], Donald Donald [name] was stiff. Eye closed. Arms folded over Donald Donald [name] chest, couldn;t pull them apart, fingers intertwined and folded on Donald Donald [name] chest. Donald Donald [name] was still stiff on Donald stretcher. Not talking. Eyes closed. No urination or defecation. Marland Kitchen. Donald Donald [name] was confused all day Sunday. EEG showed nonspecific slowing. Donald rigidity lasted 5-10 minutes. Donald Donald [name] did not urinate or defecate. Donald Donald [name] was confused Donald rest of Donald day in Donald ED, Donald Donald [name] was not back to baseline. She thought Donald Donald [name] was dead. Donald Donald [name] lactate was elevated without other significant changes in BMP. Even EMS had a very difficult time putting Donald Donald [name] on Donald stricture due to Donald Donald [name] stiffness. CT of Donald head showed no acute processes or events.  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 Donald Donald [name] was last seen by Dr. Hosie Donald [name]. Donald Donald [name] returns today for follow-up. Donald Donald [name] is currently taking Aricept 23 mg daily as well as Namenda XR 28 mg. Donald Donald [name] reports that Donald Donald [name] memory has gotten better. Donald Donald [name] Donald Donald [name] reports that Donald Donald [name] memory has gotten worse.  Donald Donald [name] continues to complete ADLs independently. Donald Donald [name] Donald Donald [name] states that occasionally Donald Donald [name] will not put on clean clothes. She states that Donald Donald [name] tends to lay Donald Donald [name] clothes out for Donald Donald [name]. Donald Donald [name] no longer operates a motor vehicle. Donald Donald Donald [name] states Donald Donald [name] had to give up Donald Donald [name] job due to Donald Donald [name] memory. Donald Donald [name] now completes all Donald finances. Donald [name] was having some hallucinations. Donald Donald [name] Donald Donald [name] states that occasionally during Donald night Donald Donald [name] will talk in Donald Donald [name] sleep but denies Donald Donald [name] may imbalance. She states occasionally if Donald Donald [name] gets up to go Donald bathroom during Donald night Donald Donald [name] may get confused. She is normally able to tell Donald Donald [name] to go back to sleep and Donald Donald [name] calms down. She reports that Donald Donald [name] continues to sleep a lot during Donald day. Donald Donald [name] is also seen by a urologist for BPH. She states currently they are checking Donald nerves in Donald Donald [name] bladder every week for 12 weeks. Other than this no new medical issues since last seen.  Interval history 10/16/2015: Dr. Lucia Donald [name]. I am seeing Donald [name] today for Donald first time. Things are going well per Donald [name]. But not per Donald Donald [name]. Donald Donald [name] is still on Aricept. Memory continues to decline. Lots of confusion expressibe himself. Started around 2010 and has been progressively slowly worsening. 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 Donald Donald [name] had noticed changes at home even before that. Trouble with short term memory, trouble remembering conversations. Donald Donald [name] is not driving. Donald Donald [name] provides  all information. Donald Donald [name] goes to a daycare center 5 days a week. Donald Donald [name] works 4 hours a day. They keep Donald Donald [name] very active and Donald Donald [name] is very happy there. Donald Donald [name] is wandering at night. She is able to redirect Donald Donald [name] into bed. During Donald day Donald Donald [name] will get defiant and not want to put on Donald depends. Right now she feels like she is ok. No delusions or hallucinations. Donald Donald [name] [address].   HISTORY 01/26/14 (SUMNER): Donald Donald [name] is a 75 y.o. male here as a follow  up from Dr. Shana [name] for cognitive decline with prior visit on 10/2013. At that visit Donald Donald [name] Aricept was increased to 23mg . Minimal to no improvement noted with this. Continues to have visual and auditory hallucinations but Donald Donald [name] notes this has decreased slightly since last visit. Donald Donald [name] notes that Donald Donald [name] can be easily redirected when Donald Donald [name] is having hallucinations. She notes that Donald Donald [name] sleeps a lot during Donald day, will watch a lot of TV. Notes Donald Donald [name] is not as active as Donald Donald [name] used to be. Donald Donald [name] is not currently driving. No difficulty eating, no choking. Donald Donald [name] notes Donald Donald [name] has been losing some weight, appetite is decreased. Has strong family history of memory loss.   Initial visit 05/2013: Started noticing around 4 years ago, has been getting progressively worse, increased decline in Donald past yar. Trouble with short term memory, trouble remembering conversations. Donald Donald [name] manages Donald finances, this changed 3 years ago. Continues driving, short distances. Donald Donald [name] has some concerns about Donald Donald [name] driving.No episodes of getting lost. Has good remote memory. No history of strokes or TIAs. Has sister with dementia. No tobacco or EtOH. 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 Has some nighttime agitation. No hallucinations.   Aricept and Namenda started around 3 to 4 years ago. Donald Donald [name] has not noticed much benefit.   Has had multiple syncopal episodes, following with Dr Donald Donald [name], adjusting Donald Donald [name] blood pressure medication. Gets very diaphoretic during these episodes. No extremity shaking.    Review of Systems: Donald [name] complains of symptoms per HPI as well as Donald following symptoms: Frequency of urination, speech difficulty, passing out, confusion, frequent waking, snoring, acting out dreams. Pertinent negatives per HPI. All others negative.   Social History   Social History  . Marital status: Married    Spouse name: Donald Donald [name]  . Number of children: 2  . Years of education: college   Occupational History    . retired    Social History Main Topics  . Smoking status: Never Smoker  . Smokeless tobacco: Never Used  . Alcohol use No  . Drug use: No  . Sexual activity: Not on file   Other Topics Concern  . Not on file   Social History Narrative   Donald [name] is married Donald Donald [name]), has 2 children   Donald [name] is right handed   Education level is college   Caffeine consumption is 0    Family History  Problem Relation Age of Onset  . Hypertension Mother   . Dementia Sister     Past Medical History:  Diagnosis Date  . Dementia   . Hypertension     Past Surgical History:  Procedure Laterality Date  . NO PAST SURGERIES      Current Outpatient Prescriptions  Medication Sig Dispense Refill  . amLODipine (NORVASC) 10 MG tablet Take 10 mg by mouth daily.    . divalproex (DEPAKOTE ER) 500 MG 24 hr tablet Take 1 tablet (500 mg  total) by mouth 2 (two) times daily. 60 tablet 12  . donepezil (ARICEPT) 23 MG TABS tablet TAKE 1 TABLET BY MOUTH AT BEDTIME 30 tablet 11  . finasteride (PROSCAR) 5 MG tablet Take 5 mg by mouth daily.  11  . mirtazapine (REMERON) 15 MG tablet Take 1 tablet (15 mg total) by mouth at bedtime. 30 tablet 12  . UNABLE TO FIND To Whom It May Concern Mr. Thivierge was admitted to [facility] on 10/29/15 and discharged on 10/30/15. Donald Donald [name] may return to Donald Donald [name] usual activities from 10/31/15. Please call if you have any questions. Osvaldo Shipper, MD [facility] [phone] 1 each 0  . valsartan (DIOVAN) 320 MG tablet Take 320 mg by mouth daily.   12   No current facility-administered medications for this visit.     Allergies as of 03/18/2016  . (No Known Allergies)    Vitals: BP (!) 165/90 (BP Location: Right Arm, Donald [name] Position: Sitting, Cuff Size: Normal)   Pulse 67   Ht 5\' 10"  (1.778 m)   Wt 181 lb 6.4 oz (82.3 kg)   SpO2 95%   BMI 26.03 kg/m  Last Weight:  Wt Readings from Last 1 Encounters:  03/18/16 181 lb 6.4 oz (82.3 kg)   Last Height:   Ht  Readings from Last 1 Encounters:  03/18/16 5\' 10"  (1.778 m)    MMSE - Mini Mental State Exam 04/18/2015  Orientation to time 0  Orientation to Place 3  Registration 3  Attention/ Calculation 0  Recall 0  Language- name 2 objects 2  Language- repeat 1  Language- follow 3 step command 3  Language- read & follow direction 1  Write a sentence 0  Copy design 1  Total score 14     Cranial nerve II-XII: Pupils were equal round reactive to light. Extraocular movements were full, visual field were full on confrontational test. Facial sensation and strength were normal. Uvula tongue midline. Head turning and shoulder shrug were normal and symmetric. Motor: Donald motor testing reveals 5 over 5 strength of all 4 extremities. Good symmetric motor tone is noted throughout.  Sensory: Sensory testing is intact to soft touch on all 4 extremities. No evidence of extinction is noted.  Coordination: Cerebellar testing reveals good finger-nose-finger and heel-to-shin bilaterally.  Gait and station: Gait is normal. Tandem gait is normal. Romberg is negative. No drift is seen.  Reflexes: Deep tendon reflexes are symmetric and normal bilaterally.    Assessment/Plan: 75 y.o. year old male has a past medical history of Dementia and Hypertension. Neurologic exam is nonfocal. Here with Donald Donald [name] Donald Donald [name] who is Donald Donald [name] caretaker. Donald Donald [name] was admitted to Donald hospital recently for an episode of severe stiffness, followed by many hours of confusion, elevated lactate in Donald emergency without significant BMP changes to indicate dehydration or other cause, no UTI: EEG showed nonspecific slowing possibly postictal changes are due to Donald Donald [name] dementia. Cannot rule out Donald possibility of seizures. We'll  continue Depakote outpatient, this may also help with Donald Donald [name] behavioral problems due to Donald Donald [name] dementia as well.  1. Dementia- likely Alzheimer's disease with behavioral changes and aggression. Continue Depakote.Donald Donald [name] declines increasing at this  time. 2. Continue Aricept. They decline Namenda. 3. Continue depakote for possible seizure event which may help with mood and behavior as well. No more seizure like events. Donald Donald [name] declines increasing at this time.  Discussed Alzheimer's dementia and Donald natural progression of this disorder. Aricept and Namenda helps preserve memory but does not improve it and memory.  This is a neurodegenerative disease and will continue to progress. Donald Donald Donald [name] appears to be very knowledgeable. Donald [name] is becoming more agitated, waking up at night and wandering. She is able to keep awake during Donald day. I did advise that if Donald Donald [name] naps during Donald day that  will increase odds Donald Donald [name] will get up at night. Donald Donald [name] also goes to sleep very early at 6:30 and I recommended trying to push Donald Donald [name] bedtime back to 9 or later if possible.  Will continue Depakote. Discussed Donald most common side effects of Depakote including serious reactions which can include hepatotoxicity, pancreatitis, hyponatremia, pancytopenia, thrombocytopenia and Donald [name]'s Donald Donald [name] denies any history of liver disease or electrolyte imbalances. She is to stop for any concerning symptoms especially rash, suicidality, psychosis and hallucinations., And reactions include headache, nausea vomiting, somnolence, thrombocytopenia, dyspepsia, dizziness, diarrhea, abdominal pain, tremor, alopecia, weight changes, appetite changes, constipation and other side effects. Please can stop for anything concerning.  Discussed that behavioral changes, wandering at night, very common in dementia. Recommended Donald book "Donald 36 hour day"  Naomie DeanAntonia Ahern, MD  [facility] [address] [place], [place] [id number]  Phone [phone] Fax [phone]  A total of 30 minutes was spent face-to-face with this Donald [name]. Over half this time was spent on counseling Donald [name] on Donald dementia diagnosis and different diagnostic and therapeutic options available.

## 2016-03-26 DIAGNOSIS — J189 Pneumonia, unspecified organism: Secondary | ICD-10-CM | POA: Diagnosis not present

## 2016-04-16 ENCOUNTER — Ambulatory Visit: Payer: Self-pay | Admitting: Adult Health

## 2016-04-17 ENCOUNTER — Ambulatory Visit: Payer: Medicare Other | Admitting: Adult Health

## 2016-05-23 DIAGNOSIS — R972 Elevated prostate specific antigen [PSA]: Secondary | ICD-10-CM | POA: Diagnosis not present

## 2016-05-30 DIAGNOSIS — R3915 Urgency of urination: Secondary | ICD-10-CM | POA: Diagnosis not present

## 2016-05-30 DIAGNOSIS — R972 Elevated prostate specific antigen [PSA]: Secondary | ICD-10-CM | POA: Diagnosis not present

## 2016-05-30 DIAGNOSIS — N401 Enlarged prostate with lower urinary tract symptoms: Secondary | ICD-10-CM | POA: Diagnosis not present

## 2016-06-05 DIAGNOSIS — J189 Pneumonia, unspecified organism: Secondary | ICD-10-CM | POA: Diagnosis not present

## 2016-06-10 ENCOUNTER — Telehealth: Payer: Self-pay | Admitting: Neurology

## 2016-06-10 ENCOUNTER — Other Ambulatory Visit: Payer: Self-pay | Admitting: Neurology

## 2016-06-10 DIAGNOSIS — Z79899 Other long term (current) drug therapy: Secondary | ICD-10-CM

## 2016-06-10 MED ORDER — DIVALPROEX SODIUM ER 500 MG PO TB24: ORAL_TABLET | ORAL | 12 refills | Status: DC

## 2016-06-10 NOTE — Telephone Encounter (Signed)
Called and spoke to friend. Wife, Donald [name] not there right now to speak with. [name] Advised our office closes at 5pm, we open tomorrow at 8am. She verbalized understanding.

## 2016-06-10 NOTE — Telephone Encounter (Signed)
Pt's wife called in stating she was given medication for the pt to help calm him down and if he needed and increase to call. She does not know the name of the medication and is at work unable to get rx bottle. She would like an increase in the dosage stating he is very combative and refuses to change dirty clothes or take showers. Please call and advise [phone] till 11:45 amor home [phone]

## 2016-06-10 NOTE — Telephone Encounter (Signed)
Dr Lucia GaskinsAhern- you last saw patient on 03/18/16. Per last office visit note "Continue depakote for possible seizure event which may help with mood and behavior as well. No more seizure like events. Wife declines increasing at this time."  Are you okay with increasing or would you like him to come in to see NP?

## 2016-06-10 NOTE — Telephone Encounter (Signed)
Donald [name], I increased his Depakote. Right now he is taking one pill in the morning anf one pill at night. I want him to take one pill in the morning and 2 pills at night. Lets try this. If this does not work I can give her a different medication thanks

## 2016-06-11 NOTE — Telephone Encounter (Signed)
Donald [name] called office back. Relayed per AA, MD she should increase his Depakote to one pill in the morning and 2 pills at night. If this does not work, per AA,MD we can give him a different medication to try. She verbalized understanding.

## 2016-06-16 DIAGNOSIS — I10 Essential (primary) hypertension: Secondary | ICD-10-CM | POA: Diagnosis not present

## 2016-06-16 DIAGNOSIS — F0280 Dementia in other diseases classified elsewhere without behavioral disturbance: Secondary | ICD-10-CM | POA: Diagnosis not present

## 2016-06-16 DIAGNOSIS — G309 Alzheimer's disease, unspecified: Secondary | ICD-10-CM | POA: Diagnosis not present

## 2016-06-16 DIAGNOSIS — N400 Enlarged prostate without lower urinary tract symptoms: Secondary | ICD-10-CM | POA: Diagnosis not present

## 2016-06-22 DIAGNOSIS — I10 Essential (primary) hypertension: Secondary | ICD-10-CM | POA: Diagnosis not present

## 2016-06-22 DIAGNOSIS — F0280 Dementia in other diseases classified elsewhere without behavioral disturbance: Secondary | ICD-10-CM | POA: Diagnosis not present

## 2016-06-22 DIAGNOSIS — N400 Enlarged prostate without lower urinary tract symptoms: Secondary | ICD-10-CM | POA: Diagnosis not present

## 2016-06-22 DIAGNOSIS — G309 Alzheimer's disease, unspecified: Secondary | ICD-10-CM | POA: Diagnosis not present

## 2016-06-28 DIAGNOSIS — I10 Essential (primary) hypertension: Secondary | ICD-10-CM | POA: Diagnosis not present

## 2016-06-28 DIAGNOSIS — N400 Enlarged prostate without lower urinary tract symptoms: Secondary | ICD-10-CM | POA: Diagnosis not present

## 2016-06-28 DIAGNOSIS — G309 Alzheimer's disease, unspecified: Secondary | ICD-10-CM | POA: Diagnosis not present

## 2016-06-28 DIAGNOSIS — F0280 Dementia in other diseases classified elsewhere without behavioral disturbance: Secondary | ICD-10-CM | POA: Diagnosis not present

## 2016-06-29 DIAGNOSIS — F0280 Dementia in other diseases classified elsewhere without behavioral disturbance: Secondary | ICD-10-CM | POA: Diagnosis not present

## 2016-06-29 DIAGNOSIS — G309 Alzheimer's disease, unspecified: Secondary | ICD-10-CM | POA: Diagnosis not present

## 2016-06-29 DIAGNOSIS — I10 Essential (primary) hypertension: Secondary | ICD-10-CM | POA: Diagnosis not present

## 2016-06-29 DIAGNOSIS — N400 Enlarged prostate without lower urinary tract symptoms: Secondary | ICD-10-CM | POA: Diagnosis not present

## 2016-07-06 DIAGNOSIS — N400 Enlarged prostate without lower urinary tract symptoms: Secondary | ICD-10-CM | POA: Diagnosis not present

## 2016-07-06 DIAGNOSIS — G309 Alzheimer's disease, unspecified: Secondary | ICD-10-CM | POA: Diagnosis not present

## 2016-07-06 DIAGNOSIS — F0280 Dementia in other diseases classified elsewhere without behavioral disturbance: Secondary | ICD-10-CM | POA: Diagnosis not present

## 2016-07-06 DIAGNOSIS — I10 Essential (primary) hypertension: Secondary | ICD-10-CM | POA: Diagnosis not present

## 2016-09-18 ENCOUNTER — Encounter: Payer: Self-pay | Admitting: Adult Health

## 2016-09-18 ENCOUNTER — Ambulatory Visit (INDEPENDENT_AMBULATORY_CARE_PROVIDER_SITE_OTHER): Payer: Medicare Other | Admitting: Adult Health

## 2016-09-18 VITALS — Ht 70.0 in

## 2016-09-18 DIAGNOSIS — Z5181 Encounter for therapeutic drug level monitoring: Secondary | ICD-10-CM

## 2016-09-18 DIAGNOSIS — G308 Other Alzheimer's disease: Secondary | ICD-10-CM | POA: Diagnosis not present

## 2016-09-18 DIAGNOSIS — F0281 Dementia in other diseases classified elsewhere with behavioral disturbance: Secondary | ICD-10-CM | POA: Diagnosis not present

## 2016-09-18 DIAGNOSIS — R451 Restlessness and agitation: Secondary | ICD-10-CM

## 2016-09-18 DIAGNOSIS — G309 Alzheimer's disease, unspecified: Principal | ICD-10-CM

## 2016-09-18 NOTE — Progress Notes (Signed)
PATIENT: Donald [name] DOB: 1940-12-10  REASON FOR VISIT: follow up- memory HISTORY FROM: patient  HISTORY OF PRESENT ILLNESS:  Today 09/18/2016: Donald [name] is a 76 year old male with a history of dementia. He returns today for an evaluation. The wife states that his memory has continued to decline. He currently resides at home with her however he does go to the [facility] during the day. She reports that he continues on Depakote 1 tablet daily for his mood and behavior. She reports that it has been beneficial. She states that he was unable to tolerate 2 tablets- as a caused him to be "like a zombie." She reports that he can still be combative at times. Reports that he mainly sleeps throughout the day and night. Becoming nonverbal. Wife states that he does not engage in conversations. His appetite has also decreased. She states that she tries to get him to eat but most the time throughout the day he only wants to sleep. He remains on Aricept. He does require assistance with all ADLs. Of course he does not operate a motor vehicle. He returns today for an evaluation.  Interval history 03/18/2016: More agitated, sleep walking. He naps during the day then He is very sleepy during the day, he will swing at her if she tries to stop him from sleeping. He is going to an adult daycare. Not aside effect of the medication. He is acting out at the center as well. Lots of agitation. Depakote has been helping with the agitation. We can increase the medication if needed. He tries to get up, he gets up every night, wife redirects him. Some nights he sleeps well. Other nights he does not. He goes to be at 6-630 and I suggest he goes to sleep later instead. No significant snoring at night, not stopping breathing. Memory worsening. Better when he is at the adult center in the day.   Interval history 12/14/2015: Wife found him, he was stiff. Eye closed. Arms folded over his chest, couldn;t  pull them apart, fingers intertwined and folded on his chest. He was still stiff on the stretcher. Not talking. Eyes closed. No urination or defecation. Marland Kitchen. He was confused all day Sunday. EEG showed nonspecific slowing. The rigidity lasted 5-10 minutes. He did not urinate or defecate. He was confused the rest of the day in the ED, he was not back to baseline. She thought he was dead. His lactate was elevated without other significant changes in BMP. Even EMS had a very difficult time putting him on the stricture due to his stiffness. CT of the head showed no acute processes or events.  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 He was last seen by Donald [name]. He returns today for follow-up. The patient is currently taking Aricept 23 mg daily as well as Namenda XR 28 mg. The patient reports that his memory has gotten better. His wife reports that his memory has gotten worse. The patient continues to complete ADLs independently. His wife states that occasionally he will not put on clean clothes. She states that he tends to lay his clothes out for him. The patient no longer operates a motor vehicle. The wife states the patient had to give up his job due to his memory. Wife now completes all the finances. Patient was having some hallucinations. His wife states that occasionally during the night he will talk in his sleep but denies  the patient may imbalance. She states occasionally if he gets up to go the bathroom during the night he may get confused. She is normally able to tell him to go back to sleep and the patient calms down. She reports that the patient continues to sleep a lot during the day. He is also seen by a urologist for BPH. She states currently they are checking the nerves in his bladder every week for 12 weeks. Other than this no new medical issues since last seen.  Interval history 10/16/2015: Donald [name]. I am  seeing patient today for the first time. Things are going well per patient. But not per wife. He is still on Aricept. Memory continues to decline. Lots of confusion expressibe himself. Started around 2010 and has been progressively slowly worsening. 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 Wife had noticed changes at home even before that. Trouble with short term memory, trouble remembering conversations. He is not driving. Wife provides all information. He goes to a daycare center 5 days a week. Wife works 4 hours a day. They keep him very active and he is very happy there. He is wandering at night. She is able to redirect him into bed. During the day he will get defiant and not want to put on the depends. Right now she feels like she is ok. No delusions or hallucinations. [facility] [address].   HISTORY 01/26/14 (SUMNER): Donald [name] is a 76 y.o. male here as a follow up from Donald [name] for cognitive decline with prior visit on 10/2013. At that visit his Aricept was increased to 23mg . Minimal to no improvement noted with this. Continues to have visual and auditory hallucinations but wife notes this has decreased slightly since last visit. Wife notes that he can be easily redirected when he is having hallucinations. She notes that he sleeps a lot during the day, will watch a lot of TV. Notes he is not as active as he used to be. He is not currently driving. No difficulty eating, no choking. Wife notes he has been losing some weight, appetite is decreased. Has strong family history of memory loss.   Initial visit 05/2013: Started noticing around 4 years ago, has been getting progressively worse, increased decline in the past yar. Trouble with short term memory, trouble remembering conversations. Wife manages the finances, this changed 3 years ago. Continues driving, short distances. Wife has some concerns about his  driving.No episodes of getting lost. Has good remote memory. No history of strokes or TIAs. Has sister with dementia. No tobacco or EtOH. 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 Has some nighttime agitation. No hallucinations.   Aricept and Namenda started around 3 to 4 years ago. Wife has not noticed much benefit.   Has had multiple syncopal episodes, following with Dr Shana [name], adjusting his blood pressure medication. Gets very diaphoretic during these episodes. No extremity shaking.     REVIEW OF SYSTEMS: Out of a complete 14 system review of symptoms, the patient complains only of the following symptoms, and all other reviewed systems are negative.  Snoring, walking difficulty, moles, behavior problem, confusion, speech difficulty, weakness, tremors, memory loss, frequency of urination  ALLERGIES: No Known Allergies  HOME MEDICATIONS: Outpatient Medications Prior to Visit  Medication Sig Dispense Refill  . amLODipine (NORVASC) 10 MG tablet Take 10 mg by mouth  daily.    . divalproex (DEPAKOTE ER) 500 MG 24 hr tablet Take one pill in the morning and 2 pills at night. (Patient taking differently: Take one pill in the morning and 1 pills at night.) 90 tablet 12  . donepezil (ARICEPT) 23 MG TABS tablet TAKE 1 TABLET BY MOUTH AT BEDTIME 30 tablet 11  . finasteride (PROSCAR) 5 MG tablet Take 5 mg by mouth daily.  11  . mirtazapine (REMERON) 15 MG tablet Take 1 tablet (15 mg total) by mouth at bedtime. 30 tablet 12  . UNABLE TO FIND To Whom It May Concern Mr. Bommarito was admitted to [facility] on 10/29/15 and discharged on 10/30/15. He may return to his usual activities from 10/31/15. Please call if you have any questions. Osvaldo Shipper, MD [facility] [phone] 1 each 0  . valsartan (DIOVAN) 320 MG tablet Take 320 mg by mouth daily.   12   No facility-administered medications prior to visit.     PAST MEDICAL  HISTORY: Past Medical History:  Diagnosis Date  . Dementia   . Hypertension     PAST SURGICAL HISTORY: Past Surgical History:  Procedure Laterality Date  . NO PAST SURGERIES      FAMILY HISTORY: Family History  Problem Relation Age of Onset  . Hypertension Mother   . Dementia Sister     SOCIAL HISTORY: Social History   Social History  . Marital status: Married    Spouse name: mary  . Number of children: 2  . Years of education: college   Occupational History  . retired    Social History Main Topics  . Smoking status: Never Smoker  . Smokeless tobacco: Never Used  . Alcohol use No  . Drug use: No  . Sexual activity: Not on file   Other Topics Concern  . Not on file   Social History Narrative   Patient is married [name]), has 2 children   Patient is right handed   Education level is college   Caffeine consumption is 0      PHYSICAL EXAM  Vitals:   09/18/16 1332  Height: 5\' 10"  (1.778 m)   There is no height or weight on file to calculate BMI.  Generalized: Well developed, in no acute distress   Neurological examination  Mentation: Alert. Follows all commands. Very limited speaking. Cranial nerve II-XII: Pupils were equal round reactive to light. Extraocular movements were full, visual field were full on confrontational test. Facial sensation and strength were normal. Uvula tongue midline. Head turning and shoulder shrug  were normal and symmetric. Motor: The motor testing reveals 5 over 5 strength of all 4 extremities. Good symmetric motor tone is noted throughout.  Sensory: Unable to test Coordination: Cerebellar testing reveals good finger-nose-finger and heel-to-shin bilaterally.  Gait and station: Gait is normal. Reflexes: Deep tendon reflexes are symmetric and normal bilaterally.   DIAGNOSTIC DATA (LABS, IMAGING, TESTING) - I reviewed patient records, labs, notes, testing and imaging myself where available.  Lab Results  Component Value Date    WBC 7.1 10/30/2015   HGB 16.0 10/30/2015   HCT 48.7 10/30/2015   MCV 83.8 10/30/2015   PLT 189 10/30/2015      Component Value Date/Time   NA 138 10/30/2015 0503   K 4.2 10/30/2015 0503   CL 104 10/30/2015 0503   CO2 23 10/30/2015 0503   GLUCOSE 91 10/30/2015 0503   BUN 15 10/30/2015 0503   CREATININE 1.42 (H) 10/30/2015 0503  CALCIUM 9.1 10/30/2015 0503   PROT 7.3 10/29/2015 0814   ALBUMIN 3.4 (L) 10/29/2015 0814   AST 36 10/29/2015 0814   ALT 27 10/29/2015 0814   ALKPHOS 39 10/29/2015 0814   BILITOT 0.8 10/29/2015 0814   GFRNONAA 46 (L) 10/30/2015 0503   GFRAA 54 (L) 10/30/2015 0503    Lab Results  Component Value Date   VITAMINB12 836 05/17/2013   Lab Results  Component Value Date   TSH 3.020 05/17/2013      ASSESSMENT AND PLAN 75 y.o. year old male  has a past medical history of Dementia and Hypertension. here with:  1. Dementia 2. Agitation  The patient's memory score continues to decline. For now he will remain on Aricept. He will continue taking Depakote for mood and behavior. I will check blood work today. Patient and his wife advised that if his symptoms worsen or he develops new symptoms they should let us know. He will follow-up in 6 months or sooner if needed.     Butch Penny, MSN, NP-C 09/18/2016, 2:03 PM [facility] [address] [place], [place] [address] [phone]

## 2016-09-18 NOTE — Patient Instructions (Signed)
Memory score declined Continue Aricept Continue Depakote Blood work today If your symptoms worsen or you develop new symptoms please let us know.

## 2016-09-19 ENCOUNTER — Telehealth: Payer: Self-pay | Admitting: *Deleted

## 2016-09-19 LAB — COMPREHENSIVE METABOLIC PANEL
ALBUMIN: 3.9 g/dL (ref 3.5–4.8)
ALT: 11 IU/L (ref 0–44)
AST: 17 IU/L (ref 0–40)
Albumin/Globulin Ratio: 1 — ABNORMAL LOW (ref 1.2–2.2)
Alkaline Phosphatase: 54 IU/L (ref 39–117)
BUN / CREAT RATIO: 11 (ref 10–24)
BUN: 15 mg/dL (ref 8–27)
Bilirubin Total: 0.2 mg/dL (ref 0.0–1.2)
CALCIUM: 9.1 mg/dL (ref 8.6–10.2)
CO2: 25 mmol/L (ref 18–29)
Chloride: 102 mmol/L (ref 96–106)
Creatinine, Ser: 1.32 mg/dL — ABNORMAL HIGH (ref 0.76–1.27)
GFR, EST AFRICAN AMERICAN: 60 mL/min/{1.73_m2} (ref >59)
GFR, EST NON AFRICAN AMERICAN: 52 mL/min/{1.73_m2} — AB (ref >59)
GLOBULIN, TOTAL: 3.9 g/dL (ref 1.5–4.5)
Glucose: 104 mg/dL — ABNORMAL HIGH (ref 65–99)
Potassium: 5 mmol/L (ref 3.5–5.2)
SODIUM: 145 mmol/L — AB (ref 134–144)
TOTAL PROTEIN: 7.8 g/dL (ref 6.0–8.5)

## 2016-09-19 LAB — CBC WITH DIFFERENTIAL/PLATELET
BASOS: 1 %
Basophils Absolute: 0.1 10*3/uL (ref 0.0–0.2)
EOS (ABSOLUTE): 0.2 10*3/uL (ref 0.0–0.4)
EOS: 2 %
HEMATOCRIT: 44.6 % (ref 37.5–51.0)
Hemoglobin: 14.8 g/dL (ref 13.0–17.7)
Immature Grans (Abs): 0 10*3/uL (ref 0.0–0.1)
Immature Granulocytes: 0 %
Lymphocytes Absolute: 2 10*3/uL (ref 0.7–3.1)
Lymphs: 26 %
MCH: 29.1 pg (ref 26.6–33.0)
MCHC: 33.2 g/dL (ref 31.5–35.7)
MCV: 88 fL (ref 79–97)
MONOS ABS: 0.8 10*3/uL (ref 0.1–0.9)
Monocytes: 10 %
NEUTROS ABS: 4.8 10*3/uL (ref 1.4–7.0)
Neutrophils: 61 %
PLATELETS: 288 10*3/uL (ref 150–379)
RBC: 5.08 x10E6/uL (ref 4.14–5.80)
RDW: 14.7 % (ref 12.3–15.4)
WBC: 7.9 10*3/uL (ref 3.4–10.8)

## 2016-09-19 NOTE — Telephone Encounter (Signed)
I called and LMVM (home) ok per DPR that lab results consistent with previous labwork, will continue to monitor and will forward to pcp, Dr. Shana ChuteSpruill.  Wife to call back if questions.

## 2016-09-19 NOTE — Telephone Encounter (Signed)
-----   Message from Butch PennyMegan Millikan, NP sent at 09/19/2016  7:40 AM EST ----- Lab work consistent with previous labwork. Will continue to monitor. Please send to PCP. Call patient with results.

## 2016-10-13 NOTE — Progress Notes (Signed)
Personally  participated in, made any corrections needed, and agree with history, physical, neuro exam,assessment and plan as stated above.    Antonia Ahern, MD [name] 

## 2016-11-18 ENCOUNTER — Other Ambulatory Visit: Payer: Self-pay | Admitting: Adult Health

## 2016-11-19 DIAGNOSIS — I10 Essential (primary) hypertension: Secondary | ICD-10-CM | POA: Diagnosis not present

## 2016-11-19 DIAGNOSIS — F0390 Unspecified dementia without behavioral disturbance: Secondary | ICD-10-CM | POA: Diagnosis not present

## 2016-11-19 DIAGNOSIS — Z0000 Encounter for general adult medical examination without abnormal findings: Secondary | ICD-10-CM | POA: Diagnosis not present

## 2016-11-19 DIAGNOSIS — N400 Enlarged prostate without lower urinary tract symptoms: Secondary | ICD-10-CM | POA: Diagnosis not present

## 2016-12-23 ENCOUNTER — Telehealth: Payer: Self-pay | Admitting: Neurology

## 2016-12-23 NOTE — Telephone Encounter (Signed)
The patients wife called and stated that the patient has been experiencing some weakness and she would like to know if he should be seen this week and would like to discuss this with the nurse. Please call and advise.

## 2016-12-23 NOTE — Telephone Encounter (Signed)
Pt was last seen in Feb by Aundra MilletMegan for dementia and has a f/u scheduled again w/ Aundra MilletMegan NP in August. Returned call to pt's wife. Cell # was busy. Left VM (per DPR) at home #. Advised to contact PCP to r/o other causes for symptoms, such as infection. May call back, as well, to schedule a sooner appt @ [facility] if needed.

## 2016-12-26 ENCOUNTER — Encounter: Payer: Self-pay | Admitting: Adult Health

## 2016-12-26 ENCOUNTER — Encounter (INDEPENDENT_AMBULATORY_CARE_PROVIDER_SITE_OTHER): Payer: Self-pay

## 2016-12-26 ENCOUNTER — Ambulatory Visit (INDEPENDENT_AMBULATORY_CARE_PROVIDER_SITE_OTHER): Payer: Medicare Other | Admitting: Adult Health

## 2016-12-26 VITALS — BP 106/70 | HR 80 | Ht 70.0 in | Wt 155.4 lb

## 2016-12-26 DIAGNOSIS — Z5181 Encounter for therapeutic drug level monitoring: Secondary | ICD-10-CM

## 2016-12-26 DIAGNOSIS — G309 Alzheimer's disease, unspecified: Secondary | ICD-10-CM

## 2016-12-26 DIAGNOSIS — F0281 Dementia in other diseases classified elsewhere with behavioral disturbance: Secondary | ICD-10-CM | POA: Diagnosis not present

## 2016-12-26 MED ORDER — MIRTAZAPINE 30 MG PO TABS: 30.0000 mg | ORAL_TABLET | Freq: Every day | ORAL | 5 refills | Status: DC

## 2016-12-26 NOTE — Patient Instructions (Addendum)
Decrease Depakote to 1 tablet at bedtime Increase Remeron to 30 mg at bedtime- new prescription sent  Blood work today If your symptoms worsen or you develop new symptoms please let us know.   Mirtazapine tablets What is this medicine? MIRTAZAPINE (mir TAZ a peen) is used to treat depression. This medicine may be used for other purposes; ask your health care provider or pharmacist if you have questions. COMMON BRAND NAME(S): Remeron What should I tell my health care provider before I take this medicine? They need to know if you have any of these conditions: -bipolar disorder -glaucoma -kidney disease -liver disease -suicidal thoughts -an unusual or allergic reaction to mirtazapine, other medicines, foods, dyes, or preservatives -pregnant or trying to get pregnant -breast-feeding How should I use this medicine? Take this medicine by mouth with a glass of water. Follow the directions on the prescription label. Take your medicine at regular intervals. Do not take your medicine more often than directed. Do not stop taking this medicine suddenly except upon the advice of your doctor. Stopping this medicine too quickly may cause serious side effects or your condition may worsen. A special MedGuide will be given to you by the pharmacist with each prescription and refill. Be sure to read this information carefully each time. Talk to your pediatrician regarding the use of this medicine in children. Special care may be needed. Overdosage: If you think you have taken too much of this medicine contact a poison control center or emergency room at once. NOTE: This medicine is only for you. Do not share this medicine with others. What if I miss a dose? If you miss a dose, take it as soon as you can. If it is almost time for your next dose, take only that dose. Do not take double or extra doses. What may interact with this medicine? Do not take this medicine with any of the following  medications: -linezolid -MAOIs like Carbex, Eldepryl, Marplan, Nardil, and Parnate -methylene blue (injected into a vein) This medicine may also interact with the following medications: -alcohol -antiviral medicines for HIV or AIDS -certain medicines that treat or prevent blood clots like warfarin -certain medicines for depression, anxiety, or psychotic disturbances -certain medicines for fungal infections like ketoconazole and itraconazole -certain medicines for migraine headache like almotriptan, eletriptan, frovatriptan, naratriptan, rizatriptan, sumatriptan, zolmitriptan -certain medicines for seizures like carbamazepine or phenytoin -certain medicines for sleep -cimetidine -erythromycin -fentanyl -lithium -medicines for blood pressure -nefazodone -rasagiline -rifampin -supplements like St. John's wort, kava kava, valerian -tramadol -tryptophan This list may not describe all possible interactions. Give your health care provider a list of all the medicines, herbs, non-prescription drugs, or dietary supplements you use. Also tell them if you smoke, drink alcohol, or use illegal drugs. Some items may interact with your medicine. What should I watch for while using this medicine? Tell your doctor if your symptoms do not get better or if they get worse. Visit your doctor or health care professional for regular checks on your progress. Because it may take several weeks to see the full effects of this medicine, it is important to continue your treatment as prescribed by your doctor. Patients and their families should watch out for new or worsening thoughts of suicide or depression. Also watch out for sudden changes in feelings such as feeling anxious, agitated, panicky, irritable, hostile, aggressive, impulsive, severely restless, overly excited and hyperactive, or not being able to sleep. If this happens, especially at the beginning of treatment or after a  change in dose, call your health  care professional. Bonita QuinYou may get drowsy or dizzy. Do not drive, use machinery, or do anything that needs mental alertness until you know how this medicine affects you. Do not stand or sit up quickly, especially if you are an older patient. This reduces the risk of dizzy or fainting spells. Alcohol may interfere with the effect of this medicine. Avoid alcoholic drinks. This medicine may cause dry eyes and blurred vision. If you wear contact lenses you may feel some discomfort. Lubricating drops may help. See your eye doctor if the problem does not go away or is severe. Your mouth may get dry. Chewing sugarless gum or sucking hard candy, and drinking plenty of water may help. Contact your doctor if the problem does not go away or is severe. What side effects may I notice from receiving this medicine? Side effects that you should report to your doctor or health care professional as soon as possible: -allergic reactions like skin rash, itching or hives, swelling of the face, lips, or tongue -anxious -changes in vision -chest pain -confusion -elevated mood, decreased need for sleep, racing thoughts, impulsive behavior -eye pain -fast, irregular heartbeat -feeling faint or lightheaded, falls -feeling agitated, angry, or irritable -fever or chills, sore throat -hallucination, loss of contact with reality -loss of balance or coordination -mouth sores -redness, blistering, peeling or loosening of the skin, including inside the mouth -restlessness, pacing, inability to keep still -seizures -stiff muscles -suicidal thoughts or other mood changes -trouble passing urine or change in the amount of urine -trouble sleeping -unusual bleeding or bruising -unusually weak or tired -vomiting Side effects that usually do not require medical attention (report to your doctor or health care professional if they continue or are bothersome): -change in appetite -constipation -dizziness -dry mouth -muscle aches  or pains -nausea -tired -weight gain This list may not describe all possible side effects. Call your doctor for medical advice about side effects. You may report side effects to FDA at 1-800-FDA-1088. Where should I keep my medicine? Keep out of the reach of children. Store at room temperature between 15 and 30 degrees C (59 and 86 degrees F) Protect from light and moisture. Throw away any unused medicine after the expiration date. NOTE: This sheet is a summary. It may not cover all possible information. If you have questions about this medicine, talk to your doctor, pharmacist, or health care provider.  2018 Elsevier/Gold Standard (2015-12-21 17:30:45)
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
## 2016-12-27 ENCOUNTER — Telehealth: Payer: Self-pay | Admitting: Adult Health

## 2016-12-27 NOTE — Telephone Encounter (Signed)
Spoke to [name] and let her know to contact pcp, Dr. Pearson GrippeJames [name].  She appreciated call back.

## 2016-12-27 NOTE — Telephone Encounter (Signed)
Spoke to [place], she stated that attending of record would be one that signs orders for pt, MD hospice would take care of orders on WE and after hours.  Can you do this at NP?  I gave her pcp Dr. Pearson GrippeJames [name] name as well.

## 2016-12-27 NOTE — Telephone Encounter (Signed)
[name] office requesting orders to start services with them.  Donald [name] received a call from Donald [name](Donald [name]) patient has lost 30lbs, not eating/drinking, non verbal, combative.  Donald [name] would like to know if Donald [name] will be the attending of record for Donald [name].  Please call Donald [name] @ [phone] would like a call back today if possible please.

## 2016-12-27 NOTE — Telephone Encounter (Signed)
I would rather PCP take care of this.

## 2016-12-31 NOTE — Progress Notes (Signed)
Personally  participated in, made any corrections needed, and agree with history, physical, neuro exam,assessment and plan as stated above.    Blandon Offerdahl, MD [name] 

## 2017-01-02 ENCOUNTER — Telehealth: Payer: Self-pay | Admitting: *Deleted

## 2017-01-02 DIAGNOSIS — R1310 Dysphagia, unspecified: Secondary | ICD-10-CM | POA: Diagnosis not present

## 2017-01-02 DIAGNOSIS — R634 Abnormal weight loss: Secondary | ICD-10-CM | POA: Diagnosis not present

## 2017-01-02 DIAGNOSIS — E46 Unspecified protein-calorie malnutrition: Secondary | ICD-10-CM | POA: Diagnosis not present

## 2017-01-02 DIAGNOSIS — N401 Enlarged prostate with lower urinary tract symptoms: Secondary | ICD-10-CM | POA: Diagnosis not present

## 2017-01-02 DIAGNOSIS — G309 Alzheimer's disease, unspecified: Secondary | ICD-10-CM | POA: Diagnosis not present

## 2017-01-02 DIAGNOSIS — G9340 Encephalopathy, unspecified: Secondary | ICD-10-CM | POA: Diagnosis not present

## 2017-01-02 DIAGNOSIS — I10 Essential (primary) hypertension: Secondary | ICD-10-CM | POA: Diagnosis not present

## 2017-01-02 NOTE — Telephone Encounter (Signed)
Received incoming fax relating to appt set for pt on 01/01/17 for [facility].  If there is something different that is needed from us to return call.  I LMVM for Morrie Sheldonshley.  Fax [phone].

## 2017-01-03 DIAGNOSIS — N401 Enlarged prostate with lower urinary tract symptoms: Secondary | ICD-10-CM | POA: Diagnosis not present

## 2017-01-03 DIAGNOSIS — I10 Essential (primary) hypertension: Secondary | ICD-10-CM | POA: Diagnosis not present

## 2017-01-03 DIAGNOSIS — R1310 Dysphagia, unspecified: Secondary | ICD-10-CM | POA: Diagnosis not present

## 2017-01-03 DIAGNOSIS — G309 Alzheimer's disease, unspecified: Secondary | ICD-10-CM | POA: Diagnosis not present

## 2017-01-03 DIAGNOSIS — R634 Abnormal weight loss: Secondary | ICD-10-CM | POA: Diagnosis not present

## 2017-01-03 DIAGNOSIS — E46 Unspecified protein-calorie malnutrition: Secondary | ICD-10-CM | POA: Diagnosis not present

## 2017-01-03 DIAGNOSIS — G9340 Encephalopathy, unspecified: Secondary | ICD-10-CM | POA: Diagnosis not present

## 2017-01-06 DIAGNOSIS — R634 Abnormal weight loss: Secondary | ICD-10-CM | POA: Diagnosis not present

## 2017-01-06 DIAGNOSIS — G309 Alzheimer's disease, unspecified: Secondary | ICD-10-CM | POA: Diagnosis not present

## 2017-01-06 DIAGNOSIS — R1310 Dysphagia, unspecified: Secondary | ICD-10-CM | POA: Diagnosis not present

## 2017-01-06 DIAGNOSIS — I10 Essential (primary) hypertension: Secondary | ICD-10-CM | POA: Diagnosis not present

## 2017-01-06 DIAGNOSIS — G9340 Encephalopathy, unspecified: Secondary | ICD-10-CM | POA: Diagnosis not present

## 2017-01-06 DIAGNOSIS — E46 Unspecified protein-calorie malnutrition: Secondary | ICD-10-CM | POA: Diagnosis not present

## 2017-01-08 DIAGNOSIS — I10 Essential (primary) hypertension: Secondary | ICD-10-CM | POA: Diagnosis not present

## 2017-01-08 DIAGNOSIS — E46 Unspecified protein-calorie malnutrition: Secondary | ICD-10-CM | POA: Diagnosis not present

## 2017-01-08 DIAGNOSIS — G9340 Encephalopathy, unspecified: Secondary | ICD-10-CM | POA: Diagnosis not present

## 2017-01-08 DIAGNOSIS — R634 Abnormal weight loss: Secondary | ICD-10-CM | POA: Diagnosis not present

## 2017-01-08 DIAGNOSIS — G309 Alzheimer's disease, unspecified: Secondary | ICD-10-CM | POA: Diagnosis not present

## 2017-01-08 DIAGNOSIS — R1310 Dysphagia, unspecified: Secondary | ICD-10-CM | POA: Diagnosis not present

## 2017-01-13 DIAGNOSIS — R634 Abnormal weight loss: Secondary | ICD-10-CM | POA: Diagnosis not present

## 2017-01-13 DIAGNOSIS — G309 Alzheimer's disease, unspecified: Secondary | ICD-10-CM | POA: Diagnosis not present

## 2017-01-13 DIAGNOSIS — E46 Unspecified protein-calorie malnutrition: Secondary | ICD-10-CM | POA: Diagnosis not present

## 2017-01-13 DIAGNOSIS — I10 Essential (primary) hypertension: Secondary | ICD-10-CM | POA: Diagnosis not present

## 2017-01-13 DIAGNOSIS — G9340 Encephalopathy, unspecified: Secondary | ICD-10-CM | POA: Diagnosis not present

## 2017-01-13 DIAGNOSIS — R1310 Dysphagia, unspecified: Secondary | ICD-10-CM | POA: Diagnosis not present

## 2017-01-16 ENCOUNTER — Telehealth: Payer: Self-pay | Admitting: Adult Health

## 2017-01-16 DIAGNOSIS — R1310 Dysphagia, unspecified: Secondary | ICD-10-CM | POA: Diagnosis not present

## 2017-01-16 DIAGNOSIS — G9340 Encephalopathy, unspecified: Secondary | ICD-10-CM | POA: Diagnosis not present

## 2017-01-16 DIAGNOSIS — R634 Abnormal weight loss: Secondary | ICD-10-CM | POA: Diagnosis not present

## 2017-01-16 DIAGNOSIS — G309 Alzheimer's disease, unspecified: Secondary | ICD-10-CM | POA: Diagnosis not present

## 2017-01-16 DIAGNOSIS — E46 Unspecified protein-calorie malnutrition: Secondary | ICD-10-CM | POA: Diagnosis not present

## 2017-01-16 DIAGNOSIS — I10 Essential (primary) hypertension: Secondary | ICD-10-CM | POA: Diagnosis not present

## 2017-01-16 NOTE — Telephone Encounter (Signed)
Increase Depakote back to 500 mg in the morning and 1000 mg at bedtime

## 2017-01-16 NOTE — Telephone Encounter (Signed)
LMVM for Toniann FailWendy, Charity fundraiserN.    Increase his depakote to 500mg  po AM and 1000mg  po PM.  (originally on this dose but decreased to daytime sleepiness).  If questions to call us back.

## 2017-01-16 NOTE — Telephone Encounter (Signed)
[facility] office in reference to patient having a lot of combative with getting ready for the day or getting bathed.  Patients wife would like to know if there is any medication patient can be prescribed to help with this.  Pharmacy- [facility]

## 2017-01-16 NOTE — Telephone Encounter (Signed)
Would have have any recommendations?  Depakote was reduced to 500mg  po BID from 500mg  AM 1000mg  PM due to increased daytime sleepiness.

## 2017-01-20 DIAGNOSIS — R1310 Dysphagia, unspecified: Secondary | ICD-10-CM | POA: Diagnosis not present

## 2017-01-20 DIAGNOSIS — R634 Abnormal weight loss: Secondary | ICD-10-CM | POA: Diagnosis not present

## 2017-01-20 DIAGNOSIS — G309 Alzheimer's disease, unspecified: Secondary | ICD-10-CM | POA: Diagnosis not present

## 2017-01-20 DIAGNOSIS — E46 Unspecified protein-calorie malnutrition: Secondary | ICD-10-CM | POA: Diagnosis not present

## 2017-01-20 DIAGNOSIS — G9340 Encephalopathy, unspecified: Secondary | ICD-10-CM | POA: Diagnosis not present

## 2017-01-20 DIAGNOSIS — I10 Essential (primary) hypertension: Secondary | ICD-10-CM | POA: Diagnosis not present

## 2017-01-21 DIAGNOSIS — R634 Abnormal weight loss: Secondary | ICD-10-CM | POA: Diagnosis not present

## 2017-01-21 DIAGNOSIS — I10 Essential (primary) hypertension: Secondary | ICD-10-CM | POA: Diagnosis not present

## 2017-01-21 DIAGNOSIS — E46 Unspecified protein-calorie malnutrition: Secondary | ICD-10-CM | POA: Diagnosis not present

## 2017-01-21 DIAGNOSIS — G309 Alzheimer's disease, unspecified: Secondary | ICD-10-CM | POA: Diagnosis not present

## 2017-01-21 DIAGNOSIS — R1310 Dysphagia, unspecified: Secondary | ICD-10-CM | POA: Diagnosis not present

## 2017-01-21 DIAGNOSIS — G9340 Encephalopathy, unspecified: Secondary | ICD-10-CM | POA: Diagnosis not present

## 2017-01-27 DIAGNOSIS — I10 Essential (primary) hypertension: Secondary | ICD-10-CM | POA: Diagnosis not present

## 2017-01-27 DIAGNOSIS — G309 Alzheimer's disease, unspecified: Secondary | ICD-10-CM | POA: Diagnosis not present

## 2017-01-27 DIAGNOSIS — R634 Abnormal weight loss: Secondary | ICD-10-CM | POA: Diagnosis not present

## 2017-01-27 DIAGNOSIS — E46 Unspecified protein-calorie malnutrition: Secondary | ICD-10-CM | POA: Diagnosis not present

## 2017-01-27 DIAGNOSIS — G9340 Encephalopathy, unspecified: Secondary | ICD-10-CM | POA: Diagnosis not present

## 2017-01-27 DIAGNOSIS — R1310 Dysphagia, unspecified: Secondary | ICD-10-CM | POA: Diagnosis not present

## 2017-02-02 DIAGNOSIS — G9340 Encephalopathy, unspecified: Secondary | ICD-10-CM | POA: Diagnosis not present

## 2017-02-02 DIAGNOSIS — E46 Unspecified protein-calorie malnutrition: Secondary | ICD-10-CM | POA: Diagnosis not present

## 2017-02-02 DIAGNOSIS — R1310 Dysphagia, unspecified: Secondary | ICD-10-CM | POA: Diagnosis not present

## 2017-02-02 DIAGNOSIS — R634 Abnormal weight loss: Secondary | ICD-10-CM | POA: Diagnosis not present

## 2017-02-02 DIAGNOSIS — N401 Enlarged prostate with lower urinary tract symptoms: Secondary | ICD-10-CM | POA: Diagnosis not present

## 2017-02-02 DIAGNOSIS — G309 Alzheimer's disease, unspecified: Secondary | ICD-10-CM | POA: Diagnosis not present

## 2017-02-02 DIAGNOSIS — I10 Essential (primary) hypertension: Secondary | ICD-10-CM | POA: Diagnosis not present

## 2017-02-07 DIAGNOSIS — G309 Alzheimer's disease, unspecified: Secondary | ICD-10-CM | POA: Diagnosis not present

## 2017-02-07 DIAGNOSIS — E46 Unspecified protein-calorie malnutrition: Secondary | ICD-10-CM | POA: Diagnosis not present

## 2017-02-07 DIAGNOSIS — R634 Abnormal weight loss: Secondary | ICD-10-CM | POA: Diagnosis not present

## 2017-02-07 DIAGNOSIS — R1310 Dysphagia, unspecified: Secondary | ICD-10-CM | POA: Diagnosis not present

## 2017-02-07 DIAGNOSIS — G9340 Encephalopathy, unspecified: Secondary | ICD-10-CM | POA: Diagnosis not present

## 2017-02-07 DIAGNOSIS — I10 Essential (primary) hypertension: Secondary | ICD-10-CM | POA: Diagnosis not present

## 2017-02-10 DIAGNOSIS — R634 Abnormal weight loss: Secondary | ICD-10-CM | POA: Diagnosis not present

## 2017-02-10 DIAGNOSIS — G309 Alzheimer's disease, unspecified: Secondary | ICD-10-CM | POA: Diagnosis not present

## 2017-02-10 DIAGNOSIS — E46 Unspecified protein-calorie malnutrition: Secondary | ICD-10-CM | POA: Diagnosis not present

## 2017-02-10 DIAGNOSIS — R1310 Dysphagia, unspecified: Secondary | ICD-10-CM | POA: Diagnosis not present

## 2017-02-10 DIAGNOSIS — I10 Essential (primary) hypertension: Secondary | ICD-10-CM | POA: Diagnosis not present

## 2017-02-10 DIAGNOSIS — G9340 Encephalopathy, unspecified: Secondary | ICD-10-CM | POA: Diagnosis not present

## 2017-02-15 DIAGNOSIS — R634 Abnormal weight loss: Secondary | ICD-10-CM | POA: Diagnosis not present

## 2017-02-15 DIAGNOSIS — R1310 Dysphagia, unspecified: Secondary | ICD-10-CM | POA: Diagnosis not present

## 2017-02-15 DIAGNOSIS — E46 Unspecified protein-calorie malnutrition: Secondary | ICD-10-CM | POA: Diagnosis not present

## 2017-02-15 DIAGNOSIS — I10 Essential (primary) hypertension: Secondary | ICD-10-CM | POA: Diagnosis not present

## 2017-02-15 DIAGNOSIS — G9340 Encephalopathy, unspecified: Secondary | ICD-10-CM | POA: Diagnosis not present

## 2017-02-15 DIAGNOSIS — G309 Alzheimer's disease, unspecified: Secondary | ICD-10-CM | POA: Diagnosis not present

## 2017-02-17 DIAGNOSIS — R1310 Dysphagia, unspecified: Secondary | ICD-10-CM | POA: Diagnosis not present

## 2017-02-17 DIAGNOSIS — I10 Essential (primary) hypertension: Secondary | ICD-10-CM | POA: Diagnosis not present

## 2017-02-17 DIAGNOSIS — R634 Abnormal weight loss: Secondary | ICD-10-CM | POA: Diagnosis not present

## 2017-02-17 DIAGNOSIS — G309 Alzheimer's disease, unspecified: Secondary | ICD-10-CM | POA: Diagnosis not present

## 2017-02-17 DIAGNOSIS — G9340 Encephalopathy, unspecified: Secondary | ICD-10-CM | POA: Diagnosis not present

## 2017-02-17 DIAGNOSIS — E46 Unspecified protein-calorie malnutrition: Secondary | ICD-10-CM | POA: Diagnosis not present

## 2017-02-26 DIAGNOSIS — R1310 Dysphagia, unspecified: Secondary | ICD-10-CM | POA: Diagnosis not present

## 2017-02-26 DIAGNOSIS — G309 Alzheimer's disease, unspecified: Secondary | ICD-10-CM | POA: Diagnosis not present

## 2017-02-26 DIAGNOSIS — R634 Abnormal weight loss: Secondary | ICD-10-CM | POA: Diagnosis not present

## 2017-02-26 DIAGNOSIS — I10 Essential (primary) hypertension: Secondary | ICD-10-CM | POA: Diagnosis not present

## 2017-02-26 DIAGNOSIS — G9340 Encephalopathy, unspecified: Secondary | ICD-10-CM | POA: Diagnosis not present

## 2017-02-26 DIAGNOSIS — E46 Unspecified protein-calorie malnutrition: Secondary | ICD-10-CM | POA: Diagnosis not present

## 2017-02-26 NOTE — Telephone Encounter (Signed)
Lengthy, pleasant conversation with wife [name], who is pt's primary caregiver.  In addition, she works full time during week.  Pt. goes to an adult daycare while wife is at work. She sts. pt. is having more difficulty sleeping at night--only sleeping about 2 hours at a time, and then is up, requires supervision.  She sts. he sleeps a lot during the day--usually goes to sleep once he gets to daycare.  She is reluctant to encourage daycare staff to keep him up during the day, b/c she is afraid pt. will become aggressive with them and not be allowed back at daycare.  She doesn't have anywhere else for him to go.  On the weekends, pt. gets aggressive with her when she tries to keep him from sleeping during the day, so she lets him sleep to keep the peace.  She is aware he is likely sleeping less during the night b/c he is sleeping so much during the day, and that there isn't necessarily a medicine to fix this. She is agreeable that he needs to be in a SNF but is not able to find a unit for him--sts. he doesn't qualify due to household income.  He is currently receiving some services thru Hospice, so I have encouraged her ask to speak with a hospice social worker that may be able to give her more guidance. She is agreeable and will call our office back if she needs anything further./fim

## 2017-02-26 NOTE — Telephone Encounter (Signed)
Patients wife called office in reference to divalproex (DEPAKOTE ER) 500 MG 24 hr tablet.  Wife states he medication will help patient sleep for 2 hours and then he is up the rest of the night.  When morning time comes to wake up patient is extremely sleepy, symptoms started 2 weeks ago.  Wife would like to know if there is a medication to help patient sleep.  [facility].  Please call

## 2017-02-27 NOTE — Telephone Encounter (Signed)
Noted  

## 2017-03-03 DIAGNOSIS — R1310 Dysphagia, unspecified: Secondary | ICD-10-CM | POA: Diagnosis not present

## 2017-03-03 DIAGNOSIS — G9340 Encephalopathy, unspecified: Secondary | ICD-10-CM | POA: Diagnosis not present

## 2017-03-03 DIAGNOSIS — E46 Unspecified protein-calorie malnutrition: Secondary | ICD-10-CM | POA: Diagnosis not present

## 2017-03-03 DIAGNOSIS — G309 Alzheimer's disease, unspecified: Secondary | ICD-10-CM | POA: Diagnosis not present

## 2017-03-03 DIAGNOSIS — I10 Essential (primary) hypertension: Secondary | ICD-10-CM | POA: Diagnosis not present

## 2017-03-03 DIAGNOSIS — R634 Abnormal weight loss: Secondary | ICD-10-CM | POA: Diagnosis not present

## 2017-03-04 DIAGNOSIS — G9340 Encephalopathy, unspecified: Secondary | ICD-10-CM | POA: Diagnosis not present

## 2017-03-04 DIAGNOSIS — R634 Abnormal weight loss: Secondary | ICD-10-CM | POA: Diagnosis not present

## 2017-03-04 DIAGNOSIS — E46 Unspecified protein-calorie malnutrition: Secondary | ICD-10-CM | POA: Diagnosis not present

## 2017-03-04 DIAGNOSIS — I10 Essential (primary) hypertension: Secondary | ICD-10-CM | POA: Diagnosis not present

## 2017-03-04 DIAGNOSIS — R1310 Dysphagia, unspecified: Secondary | ICD-10-CM | POA: Diagnosis not present

## 2017-03-04 DIAGNOSIS — G309 Alzheimer's disease, unspecified: Secondary | ICD-10-CM | POA: Diagnosis not present

## 2017-03-05 DIAGNOSIS — E46 Unspecified protein-calorie malnutrition: Secondary | ICD-10-CM | POA: Diagnosis not present

## 2017-03-05 DIAGNOSIS — G309 Alzheimer's disease, unspecified: Secondary | ICD-10-CM | POA: Diagnosis not present

## 2017-03-05 DIAGNOSIS — R634 Abnormal weight loss: Secondary | ICD-10-CM | POA: Diagnosis not present

## 2017-03-05 DIAGNOSIS — N401 Enlarged prostate with lower urinary tract symptoms: Secondary | ICD-10-CM | POA: Diagnosis not present

## 2017-03-05 DIAGNOSIS — I10 Essential (primary) hypertension: Secondary | ICD-10-CM | POA: Diagnosis not present

## 2017-03-05 DIAGNOSIS — G9340 Encephalopathy, unspecified: Secondary | ICD-10-CM | POA: Diagnosis not present

## 2017-03-05 DIAGNOSIS — R1310 Dysphagia, unspecified: Secondary | ICD-10-CM | POA: Diagnosis not present

## 2017-03-08 DIAGNOSIS — R1310 Dysphagia, unspecified: Secondary | ICD-10-CM | POA: Diagnosis not present

## 2017-03-08 DIAGNOSIS — G309 Alzheimer's disease, unspecified: Secondary | ICD-10-CM | POA: Diagnosis not present

## 2017-03-08 DIAGNOSIS — I10 Essential (primary) hypertension: Secondary | ICD-10-CM | POA: Diagnosis not present

## 2017-03-08 DIAGNOSIS — G9340 Encephalopathy, unspecified: Secondary | ICD-10-CM | POA: Diagnosis not present

## 2017-03-08 DIAGNOSIS — E46 Unspecified protein-calorie malnutrition: Secondary | ICD-10-CM | POA: Diagnosis not present

## 2017-03-08 DIAGNOSIS — R634 Abnormal weight loss: Secondary | ICD-10-CM | POA: Diagnosis not present

## 2017-03-10 DIAGNOSIS — G309 Alzheimer's disease, unspecified: Secondary | ICD-10-CM | POA: Diagnosis not present

## 2017-03-10 DIAGNOSIS — E46 Unspecified protein-calorie malnutrition: Secondary | ICD-10-CM | POA: Diagnosis not present

## 2017-03-10 DIAGNOSIS — R1310 Dysphagia, unspecified: Secondary | ICD-10-CM | POA: Diagnosis not present

## 2017-03-10 DIAGNOSIS — I10 Essential (primary) hypertension: Secondary | ICD-10-CM | POA: Diagnosis not present

## 2017-03-10 DIAGNOSIS — G9340 Encephalopathy, unspecified: Secondary | ICD-10-CM | POA: Diagnosis not present

## 2017-03-10 DIAGNOSIS — R634 Abnormal weight loss: Secondary | ICD-10-CM | POA: Diagnosis not present

## 2017-03-12 DIAGNOSIS — E46 Unspecified protein-calorie malnutrition: Secondary | ICD-10-CM | POA: Diagnosis not present

## 2017-03-12 DIAGNOSIS — G1229 Other motor neuron disease: Secondary | ICD-10-CM | POA: Diagnosis not present

## 2017-03-12 DIAGNOSIS — G9340 Encephalopathy, unspecified: Secondary | ICD-10-CM | POA: Diagnosis not present

## 2017-03-12 DIAGNOSIS — I10 Essential (primary) hypertension: Secondary | ICD-10-CM | POA: Diagnosis not present

## 2017-03-12 DIAGNOSIS — Z0000 Encounter for general adult medical examination without abnormal findings: Secondary | ICD-10-CM | POA: Diagnosis not present

## 2017-03-12 DIAGNOSIS — R634 Abnormal weight loss: Secondary | ICD-10-CM | POA: Diagnosis not present

## 2017-03-12 DIAGNOSIS — F0391 Unspecified dementia with behavioral disturbance: Secondary | ICD-10-CM | POA: Diagnosis not present

## 2017-03-12 DIAGNOSIS — R1310 Dysphagia, unspecified: Secondary | ICD-10-CM | POA: Diagnosis not present

## 2017-03-12 DIAGNOSIS — G309 Alzheimer's disease, unspecified: Secondary | ICD-10-CM | POA: Diagnosis not present

## 2017-03-15 DIAGNOSIS — E46 Unspecified protein-calorie malnutrition: Secondary | ICD-10-CM | POA: Diagnosis not present

## 2017-03-15 DIAGNOSIS — G309 Alzheimer's disease, unspecified: Secondary | ICD-10-CM | POA: Diagnosis not present

## 2017-03-15 DIAGNOSIS — G9340 Encephalopathy, unspecified: Secondary | ICD-10-CM | POA: Diagnosis not present

## 2017-03-15 DIAGNOSIS — R1310 Dysphagia, unspecified: Secondary | ICD-10-CM | POA: Diagnosis not present

## 2017-03-15 DIAGNOSIS — I10 Essential (primary) hypertension: Secondary | ICD-10-CM | POA: Diagnosis not present

## 2017-03-15 DIAGNOSIS — R634 Abnormal weight loss: Secondary | ICD-10-CM | POA: Diagnosis not present

## 2017-03-17 DIAGNOSIS — E46 Unspecified protein-calorie malnutrition: Secondary | ICD-10-CM | POA: Diagnosis not present

## 2017-03-17 DIAGNOSIS — R634 Abnormal weight loss: Secondary | ICD-10-CM | POA: Diagnosis not present

## 2017-03-17 DIAGNOSIS — R1310 Dysphagia, unspecified: Secondary | ICD-10-CM | POA: Diagnosis not present

## 2017-03-17 DIAGNOSIS — G309 Alzheimer's disease, unspecified: Secondary | ICD-10-CM | POA: Diagnosis not present

## 2017-03-17 DIAGNOSIS — I10 Essential (primary) hypertension: Secondary | ICD-10-CM | POA: Diagnosis not present

## 2017-03-17 DIAGNOSIS — G9340 Encephalopathy, unspecified: Secondary | ICD-10-CM | POA: Diagnosis not present

## 2017-03-19 ENCOUNTER — Encounter: Payer: Self-pay | Admitting: Adult Health

## 2017-03-19 ENCOUNTER — Ambulatory Visit (INDEPENDENT_AMBULATORY_CARE_PROVIDER_SITE_OTHER): Admitting: Adult Health

## 2017-03-19 VITALS — BP 150/82 | HR 80 | Ht 70.0 in | Wt 149.4 lb

## 2017-03-19 DIAGNOSIS — R634 Abnormal weight loss: Secondary | ICD-10-CM | POA: Diagnosis not present

## 2017-03-19 DIAGNOSIS — F0281 Dementia in other diseases classified elsewhere with behavioral disturbance: Secondary | ICD-10-CM

## 2017-03-19 DIAGNOSIS — G309 Alzheimer's disease, unspecified: Secondary | ICD-10-CM

## 2017-03-19 DIAGNOSIS — I10 Essential (primary) hypertension: Secondary | ICD-10-CM | POA: Diagnosis not present

## 2017-03-19 DIAGNOSIS — G9340 Encephalopathy, unspecified: Secondary | ICD-10-CM | POA: Diagnosis not present

## 2017-03-19 DIAGNOSIS — R1310 Dysphagia, unspecified: Secondary | ICD-10-CM | POA: Diagnosis not present

## 2017-03-19 DIAGNOSIS — E46 Unspecified protein-calorie malnutrition: Secondary | ICD-10-CM | POA: Diagnosis not present

## 2017-03-19 NOTE — Patient Instructions (Signed)
Your Plan:  Continue Depakote 10 mL twice a day. Can try to decrease morning dose to 5 ml to see if daytime sleepiness improves. Continue Remeron If your symptoms worsen or you develop new symptoms please let us know.     Thank you for coming to see [facility] at [facility]. I hope we have been able to provide you high quality care today.  You may receive a patient satisfaction survey over the next few weeks. We would appreciate your feedback and comments so that we may continue to improve ourselves and the health of our patients.
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
## 2017-03-20 ENCOUNTER — Ambulatory Visit: Payer: Medicare Other | Admitting: Adult Health

## 2017-03-20 ENCOUNTER — Non-Acute Institutional Stay (SKILLED_NURSING_FACILITY): Admitting: Adult Health

## 2017-03-20 ENCOUNTER — Encounter: Payer: Self-pay | Admitting: Adult Health

## 2017-03-20 DIAGNOSIS — G309 Alzheimer's disease, unspecified: Secondary | ICD-10-CM | POA: Diagnosis not present

## 2017-03-20 DIAGNOSIS — G9340 Encephalopathy, unspecified: Secondary | ICD-10-CM | POA: Diagnosis not present

## 2017-03-20 DIAGNOSIS — F32A Depression, unspecified: Secondary | ICD-10-CM

## 2017-03-20 DIAGNOSIS — F39 Unspecified mood [affective] disorder: Secondary | ICD-10-CM

## 2017-03-20 DIAGNOSIS — F0280 Dementia in other diseases classified elsewhere without behavioral disturbance: Secondary | ICD-10-CM | POA: Diagnosis not present

## 2017-03-20 DIAGNOSIS — E46 Unspecified protein-calorie malnutrition: Secondary | ICD-10-CM | POA: Diagnosis not present

## 2017-03-20 DIAGNOSIS — I10 Essential (primary) hypertension: Secondary | ICD-10-CM | POA: Diagnosis not present

## 2017-03-20 DIAGNOSIS — N400 Enlarged prostate without lower urinary tract symptoms: Secondary | ICD-10-CM | POA: Diagnosis not present

## 2017-03-20 DIAGNOSIS — F329 Major depressive disorder, single episode, unspecified: Secondary | ICD-10-CM

## 2017-03-20 DIAGNOSIS — R634 Abnormal weight loss: Secondary | ICD-10-CM | POA: Diagnosis not present

## 2017-03-20 DIAGNOSIS — R1310 Dysphagia, unspecified: Secondary | ICD-10-CM | POA: Diagnosis not present

## 2017-03-20 NOTE — Progress Notes (Signed)
I have reviewed and agreed above plan. 

## 2017-03-20 NOTE — Progress Notes (Signed)
DATE:  03/20/2017   MRN:  [id number]  BIRTHDAY: 1940/09/14  Facility:  Nursing Home Location:  [facility] Room Number: 317-A  LEVEL OF CARE:  SNF (31)  Contact Information    Name Relation Home Work Mobile   Degraffenriedt,[name] Spouse [phone]  [phone]       Code Status History    Date Active Date Inactive Code Status Order ID Comments User Context   10/29/2015  2:43 PM 10/30/2015  9:53 PM DNR [id number]  Meredith PelGuenther, Paula M, NP Inpatient    Questions for Most Recent Historical Code Status (Order [id number])    Question Answer Comment   In the event of cardiac or respiratory ARREST Do not call a "code blue"    In the event of cardiac or respiratory ARREST Do not perform Intubation, CPR, defibrillation or ACLS    In the event of cardiac or respiratory ARREST Use medication by any route, position, wound care, and other measures to relive pain and suffering. May use oxygen, suction and manual treatment of airway obstruction as needed for comfort.         Advance Directive Documentation     Most Recent Value  Type of Advance Directive  Out of facility DNR (pink MOST or yellow form)  Pre-existing out of facility DNR order (yellow form or pink MOST form)  -  "MOST" Form in Place?  -       Chief Complaint  Patient presents with  . Acute Visit    Hospice respite    HISTORY OF PRESENT ILLNESS:  This is a 75-YO male seen for an acute visit.  He was admitted to [facility] on 03/19/17 for hospice respite care.  He has a PMH of Alzheimer's, dysphagia, and encephalopathy. He was seen in the room today. He did not utter a word throughout my visit. Charge nurse reported that he ambulates around the facility.      PAST MEDICAL HISTORY:  Past Medical History:  Diagnosis Date  . Dementia   . Hypertension      CURRENT MEDICATIONS: Reviewed  Patient's Medications  New Prescriptions   No medications on file  Previous  Medications   AMLODIPINE (NORVASC) 10 MG TABLET    Take 10 mg by mouth daily.    DIVALPROEX (DEPAKOTE ER) 500 MG 24 HR TABLET    Take 500-1,000 mg by mouth. Take one tablet qam, take 2 tablets every evening   DONEPEZIL (ARICEPT) 23 MG TABS TABLET    TAKE 1 TABLET BY MOUTH AT BEDTIME   FINASTERIDE (PROSCAR) 5 MG TABLET    Take 5 mg by mouth daily.   MIRTAZAPINE (REMERON) 15 MG TABLET    Take 15 mg by mouth at bedtime.   VALSARTAN (DIOVAN) 320 MG TABLET    Take 320 mg by mouth daily.  Modified Medications   No medications on file  Discontinued Medications   DEXTROMETHORPHAN-QUINIDINE (NUEDEXTA) 20-10 MG CAPS    Take by mouth.   MIRTAZAPINE (REMERON) 30 MG TABLET    Take 1 tablet (30 mg total) by mouth at bedtime.   UNABLE TO FIND    To Whom It May Concern Mr. Shanon RosserDegraffenreidt was [facility] on 10/29/15 and discharged on 10/30/15. He may return to his usual activities from 10/31/15. Please call if you have any questions. Osvaldo ShipperGokul Krishnan, MD [facility] [phone]   VALPROATE SODIUM (VALPROIC ACID) 250 MG/5ML SOLN    Take 10 mLs by  mouth 2 (two) times daily.     No Known Allergies   REVIEW OF SYSTEMS:   Unable to obtain due to Alzheimer's Dementia    PHYSICAL EXAMINATION  GENERAL APPEARANCE: Well nourished. In no acute distress. Normal body habitus SKIN:  Skin is warm and dry.  HEAD: Normal in size and contour. No evidence of trauma EYES: Lids open and close normally. No blepharitis, entropion or ectropion. PERRL.  MOUTH and THROAT: Lips are without lesions.  RESPIRATORY: breathing is even & unlabored, BS CTAB CARDIAC: RRR, no murmur,no extra heart sounds, no edema GI: abdomen soft, normal BS, no masses, no tenderness EXTREMITIES:  Able to move X 4 extremities PSYCHIATRIC: Affect and behavior are appropriate   LABS/RADIOLOGY: Labs reviewed: Basic Metabolic Panel:  Recent Labs  16/10/96 1517  NA 145*  K 5.0  CL 102  CO2 25  GLUCOSE 104*  BUN  15  CREATININE 1.32*  CALCIUM 9.1   Liver Function Tests:  Recent Labs  09/18/16 1517  AST 17  ALT 11  ALKPHOS 54  BILITOT 0.2  PROT 7.8  ALBUMIN 3.9   CBC:  Recent Labs  09/18/16 1517  WBC 7.9  NEUTROABS 4.8  HGB 14.8  HCT 44.6  MCV 88  PLT 288   ASSESSMENT/PLAN:  1. Dementia in Alzheimer's disease - currently having respite care @ [facility], continue supportive care, fall precautions; continue Aricept 23 mg 1 tab Q HS; followed-up by hospice  2. Essential hypertension - well-controlled; continue valsartan 320 mg 1 tab daily and amlodipine 10 mg 1 tab daily   3. Benign prostatic hyperplasia, unspecified whether lower urinary tract symptoms present - continue finasteride 5 mg 1 tab daily at bedtime   4. Mood disorder (HCC) - mood this is stable; continue Depakote ER 500 mg 1 tab every morning and 2 tabs = 1000 mg by mouth every evening  continue mirtazapine 15 mg 1 tab daily at bedtime 5. Chronic depression -      Goals of care:  Respite care/Hospice    Monina C. Medina-Vargas - NP    [employer] [phone]

## 2017-03-21 DIAGNOSIS — I10 Essential (primary) hypertension: Secondary | ICD-10-CM | POA: Diagnosis not present

## 2017-03-21 DIAGNOSIS — E46 Unspecified protein-calorie malnutrition: Secondary | ICD-10-CM | POA: Diagnosis not present

## 2017-03-21 DIAGNOSIS — G309 Alzheimer's disease, unspecified: Secondary | ICD-10-CM | POA: Diagnosis not present

## 2017-03-21 DIAGNOSIS — R1310 Dysphagia, unspecified: Secondary | ICD-10-CM | POA: Diagnosis not present

## 2017-03-21 DIAGNOSIS — G9340 Encephalopathy, unspecified: Secondary | ICD-10-CM | POA: Diagnosis not present

## 2017-03-21 DIAGNOSIS — R634 Abnormal weight loss: Secondary | ICD-10-CM | POA: Diagnosis not present

## 2017-03-24 DIAGNOSIS — R634 Abnormal weight loss: Secondary | ICD-10-CM | POA: Diagnosis not present

## 2017-03-24 DIAGNOSIS — I10 Essential (primary) hypertension: Secondary | ICD-10-CM | POA: Diagnosis not present

## 2017-03-24 DIAGNOSIS — G9340 Encephalopathy, unspecified: Secondary | ICD-10-CM | POA: Diagnosis not present

## 2017-03-24 DIAGNOSIS — E46 Unspecified protein-calorie malnutrition: Secondary | ICD-10-CM | POA: Diagnosis not present

## 2017-03-24 DIAGNOSIS — R1310 Dysphagia, unspecified: Secondary | ICD-10-CM | POA: Diagnosis not present

## 2017-03-24 DIAGNOSIS — G309 Alzheimer's disease, unspecified: Secondary | ICD-10-CM | POA: Diagnosis not present

## 2017-03-25 DIAGNOSIS — E46 Unspecified protein-calorie malnutrition: Secondary | ICD-10-CM | POA: Diagnosis not present

## 2017-03-25 DIAGNOSIS — R634 Abnormal weight loss: Secondary | ICD-10-CM | POA: Diagnosis not present

## 2017-03-25 DIAGNOSIS — I10 Essential (primary) hypertension: Secondary | ICD-10-CM | POA: Diagnosis not present

## 2017-03-25 DIAGNOSIS — G9340 Encephalopathy, unspecified: Secondary | ICD-10-CM | POA: Diagnosis not present

## 2017-03-25 DIAGNOSIS — G309 Alzheimer's disease, unspecified: Secondary | ICD-10-CM | POA: Diagnosis not present

## 2017-03-25 DIAGNOSIS — R1310 Dysphagia, unspecified: Secondary | ICD-10-CM | POA: Diagnosis not present

## 2017-03-29 DIAGNOSIS — I10 Essential (primary) hypertension: Secondary | ICD-10-CM | POA: Diagnosis not present

## 2017-03-29 DIAGNOSIS — G9340 Encephalopathy, unspecified: Secondary | ICD-10-CM | POA: Diagnosis not present

## 2017-03-29 DIAGNOSIS — R1310 Dysphagia, unspecified: Secondary | ICD-10-CM | POA: Diagnosis not present

## 2017-03-29 DIAGNOSIS — R634 Abnormal weight loss: Secondary | ICD-10-CM | POA: Diagnosis not present

## 2017-03-29 DIAGNOSIS — G309 Alzheimer's disease, unspecified: Secondary | ICD-10-CM | POA: Diagnosis not present

## 2017-03-29 DIAGNOSIS — E46 Unspecified protein-calorie malnutrition: Secondary | ICD-10-CM | POA: Diagnosis not present

## 2017-03-31 DIAGNOSIS — G309 Alzheimer's disease, unspecified: Secondary | ICD-10-CM | POA: Diagnosis not present

## 2017-03-31 DIAGNOSIS — E46 Unspecified protein-calorie malnutrition: Secondary | ICD-10-CM | POA: Diagnosis not present

## 2017-03-31 DIAGNOSIS — G9340 Encephalopathy, unspecified: Secondary | ICD-10-CM | POA: Diagnosis not present

## 2017-03-31 DIAGNOSIS — R1310 Dysphagia, unspecified: Secondary | ICD-10-CM | POA: Diagnosis not present

## 2017-03-31 DIAGNOSIS — I10 Essential (primary) hypertension: Secondary | ICD-10-CM | POA: Diagnosis not present

## 2017-03-31 DIAGNOSIS — R634 Abnormal weight loss: Secondary | ICD-10-CM | POA: Diagnosis not present

## 2017-04-05 DIAGNOSIS — R1310 Dysphagia, unspecified: Secondary | ICD-10-CM | POA: Diagnosis not present

## 2017-04-05 DIAGNOSIS — E46 Unspecified protein-calorie malnutrition: Secondary | ICD-10-CM | POA: Diagnosis not present

## 2017-04-05 DIAGNOSIS — G309 Alzheimer's disease, unspecified: Secondary | ICD-10-CM | POA: Diagnosis not present

## 2017-04-05 DIAGNOSIS — R634 Abnormal weight loss: Secondary | ICD-10-CM | POA: Diagnosis not present

## 2017-04-05 DIAGNOSIS — N401 Enlarged prostate with lower urinary tract symptoms: Secondary | ICD-10-CM | POA: Diagnosis not present

## 2017-04-05 DIAGNOSIS — I10 Essential (primary) hypertension: Secondary | ICD-10-CM | POA: Diagnosis not present

## 2017-04-05 DIAGNOSIS — G9340 Encephalopathy, unspecified: Secondary | ICD-10-CM | POA: Diagnosis not present

## 2017-04-09 DIAGNOSIS — I10 Essential (primary) hypertension: Secondary | ICD-10-CM | POA: Diagnosis not present

## 2017-04-09 DIAGNOSIS — G9340 Encephalopathy, unspecified: Secondary | ICD-10-CM | POA: Diagnosis not present

## 2017-04-09 DIAGNOSIS — R1310 Dysphagia, unspecified: Secondary | ICD-10-CM | POA: Diagnosis not present

## 2017-04-09 DIAGNOSIS — G309 Alzheimer's disease, unspecified: Secondary | ICD-10-CM | POA: Diagnosis not present

## 2017-04-09 DIAGNOSIS — E46 Unspecified protein-calorie malnutrition: Secondary | ICD-10-CM | POA: Diagnosis not present

## 2017-04-09 DIAGNOSIS — R634 Abnormal weight loss: Secondary | ICD-10-CM | POA: Diagnosis not present

## 2017-04-11 DIAGNOSIS — E46 Unspecified protein-calorie malnutrition: Secondary | ICD-10-CM | POA: Diagnosis not present

## 2017-04-11 DIAGNOSIS — R634 Abnormal weight loss: Secondary | ICD-10-CM | POA: Diagnosis not present

## 2017-04-11 DIAGNOSIS — R1310 Dysphagia, unspecified: Secondary | ICD-10-CM | POA: Diagnosis not present

## 2017-04-11 DIAGNOSIS — I10 Essential (primary) hypertension: Secondary | ICD-10-CM | POA: Diagnosis not present

## 2017-04-11 DIAGNOSIS — G9340 Encephalopathy, unspecified: Secondary | ICD-10-CM | POA: Diagnosis not present

## 2017-04-11 DIAGNOSIS — G309 Alzheimer's disease, unspecified: Secondary | ICD-10-CM | POA: Diagnosis not present

## 2017-04-12 DIAGNOSIS — I10 Essential (primary) hypertension: Secondary | ICD-10-CM | POA: Diagnosis not present

## 2017-04-12 DIAGNOSIS — R1310 Dysphagia, unspecified: Secondary | ICD-10-CM | POA: Diagnosis not present

## 2017-04-12 DIAGNOSIS — G309 Alzheimer's disease, unspecified: Secondary | ICD-10-CM | POA: Diagnosis not present

## 2017-04-12 DIAGNOSIS — R634 Abnormal weight loss: Secondary | ICD-10-CM | POA: Diagnosis not present

## 2017-04-12 DIAGNOSIS — E46 Unspecified protein-calorie malnutrition: Secondary | ICD-10-CM | POA: Diagnosis not present

## 2017-04-12 DIAGNOSIS — G9340 Encephalopathy, unspecified: Secondary | ICD-10-CM | POA: Diagnosis not present

## 2017-04-16 DIAGNOSIS — R634 Abnormal weight loss: Secondary | ICD-10-CM | POA: Diagnosis not present

## 2017-04-16 DIAGNOSIS — G9340 Encephalopathy, unspecified: Secondary | ICD-10-CM | POA: Diagnosis not present

## 2017-04-16 DIAGNOSIS — R1310 Dysphagia, unspecified: Secondary | ICD-10-CM | POA: Diagnosis not present

## 2017-04-16 DIAGNOSIS — E46 Unspecified protein-calorie malnutrition: Secondary | ICD-10-CM | POA: Diagnosis not present

## 2017-04-16 DIAGNOSIS — I10 Essential (primary) hypertension: Secondary | ICD-10-CM | POA: Diagnosis not present

## 2017-04-16 DIAGNOSIS — G309 Alzheimer's disease, unspecified: Secondary | ICD-10-CM | POA: Diagnosis not present

## 2017-04-21 DIAGNOSIS — E46 Unspecified protein-calorie malnutrition: Secondary | ICD-10-CM | POA: Diagnosis not present

## 2017-04-21 DIAGNOSIS — R1310 Dysphagia, unspecified: Secondary | ICD-10-CM | POA: Diagnosis not present

## 2017-04-21 DIAGNOSIS — R634 Abnormal weight loss: Secondary | ICD-10-CM | POA: Diagnosis not present

## 2017-04-21 DIAGNOSIS — I10 Essential (primary) hypertension: Secondary | ICD-10-CM | POA: Diagnosis not present

## 2017-04-21 DIAGNOSIS — G309 Alzheimer's disease, unspecified: Secondary | ICD-10-CM | POA: Diagnosis not present

## 2017-04-21 DIAGNOSIS — G9340 Encephalopathy, unspecified: Secondary | ICD-10-CM | POA: Diagnosis not present

## 2017-04-22 DIAGNOSIS — F0391 Unspecified dementia with behavioral disturbance: Secondary | ICD-10-CM | POA: Diagnosis not present

## 2017-04-22 DIAGNOSIS — Z23 Encounter for immunization: Secondary | ICD-10-CM | POA: Diagnosis not present

## 2017-04-22 DIAGNOSIS — N400 Enlarged prostate without lower urinary tract symptoms: Secondary | ICD-10-CM | POA: Diagnosis not present

## 2017-04-22 DIAGNOSIS — R630 Anorexia: Secondary | ICD-10-CM | POA: Diagnosis not present

## 2017-04-22 DIAGNOSIS — I10 Essential (primary) hypertension: Secondary | ICD-10-CM | POA: Diagnosis not present

## 2017-04-23 DIAGNOSIS — E46 Unspecified protein-calorie malnutrition: Secondary | ICD-10-CM | POA: Diagnosis not present

## 2017-04-23 DIAGNOSIS — R634 Abnormal weight loss: Secondary | ICD-10-CM | POA: Diagnosis not present

## 2017-04-23 DIAGNOSIS — G309 Alzheimer's disease, unspecified: Secondary | ICD-10-CM | POA: Diagnosis not present

## 2017-04-23 DIAGNOSIS — R1310 Dysphagia, unspecified: Secondary | ICD-10-CM | POA: Diagnosis not present

## 2017-04-23 DIAGNOSIS — G9340 Encephalopathy, unspecified: Secondary | ICD-10-CM | POA: Diagnosis not present

## 2017-04-23 DIAGNOSIS — I10 Essential (primary) hypertension: Secondary | ICD-10-CM | POA: Diagnosis not present

## 2017-04-24 DIAGNOSIS — I10 Essential (primary) hypertension: Secondary | ICD-10-CM | POA: Diagnosis not present

## 2017-04-24 DIAGNOSIS — G9340 Encephalopathy, unspecified: Secondary | ICD-10-CM | POA: Diagnosis not present

## 2017-04-24 DIAGNOSIS — R1310 Dysphagia, unspecified: Secondary | ICD-10-CM | POA: Diagnosis not present

## 2017-04-24 DIAGNOSIS — R634 Abnormal weight loss: Secondary | ICD-10-CM | POA: Diagnosis not present

## 2017-04-24 DIAGNOSIS — E46 Unspecified protein-calorie malnutrition: Secondary | ICD-10-CM | POA: Diagnosis not present

## 2017-04-24 DIAGNOSIS — G309 Alzheimer's disease, unspecified: Secondary | ICD-10-CM | POA: Diagnosis not present

## 2017-04-30 DIAGNOSIS — E46 Unspecified protein-calorie malnutrition: Secondary | ICD-10-CM | POA: Diagnosis not present

## 2017-04-30 DIAGNOSIS — G309 Alzheimer's disease, unspecified: Secondary | ICD-10-CM | POA: Diagnosis not present

## 2017-04-30 DIAGNOSIS — R634 Abnormal weight loss: Secondary | ICD-10-CM | POA: Diagnosis not present

## 2017-04-30 DIAGNOSIS — G9340 Encephalopathy, unspecified: Secondary | ICD-10-CM | POA: Diagnosis not present

## 2017-04-30 DIAGNOSIS — I10 Essential (primary) hypertension: Secondary | ICD-10-CM | POA: Diagnosis not present

## 2017-04-30 DIAGNOSIS — R1310 Dysphagia, unspecified: Secondary | ICD-10-CM | POA: Diagnosis not present

## 2017-05-03 DIAGNOSIS — R634 Abnormal weight loss: Secondary | ICD-10-CM | POA: Diagnosis not present

## 2017-05-03 DIAGNOSIS — G309 Alzheimer's disease, unspecified: Secondary | ICD-10-CM | POA: Diagnosis not present

## 2017-05-03 DIAGNOSIS — G9340 Encephalopathy, unspecified: Secondary | ICD-10-CM | POA: Diagnosis not present

## 2017-05-03 DIAGNOSIS — I10 Essential (primary) hypertension: Secondary | ICD-10-CM | POA: Diagnosis not present

## 2017-05-03 DIAGNOSIS — R1310 Dysphagia, unspecified: Secondary | ICD-10-CM | POA: Diagnosis not present

## 2017-05-03 DIAGNOSIS — E46 Unspecified protein-calorie malnutrition: Secondary | ICD-10-CM | POA: Diagnosis not present

## 2017-05-05 DIAGNOSIS — G9340 Encephalopathy, unspecified: Secondary | ICD-10-CM | POA: Diagnosis not present

## 2017-05-05 DIAGNOSIS — R1310 Dysphagia, unspecified: Secondary | ICD-10-CM | POA: Diagnosis not present

## 2017-05-05 DIAGNOSIS — R634 Abnormal weight loss: Secondary | ICD-10-CM | POA: Diagnosis not present

## 2017-05-05 DIAGNOSIS — E46 Unspecified protein-calorie malnutrition: Secondary | ICD-10-CM | POA: Diagnosis not present

## 2017-05-05 DIAGNOSIS — I10 Essential (primary) hypertension: Secondary | ICD-10-CM | POA: Diagnosis not present

## 2017-05-05 DIAGNOSIS — G309 Alzheimer's disease, unspecified: Secondary | ICD-10-CM | POA: Diagnosis not present

## 2017-05-05 DIAGNOSIS — N401 Enlarged prostate with lower urinary tract symptoms: Secondary | ICD-10-CM | POA: Diagnosis not present

## 2017-05-07 DIAGNOSIS — G9340 Encephalopathy, unspecified: Secondary | ICD-10-CM | POA: Diagnosis not present

## 2017-05-07 DIAGNOSIS — R634 Abnormal weight loss: Secondary | ICD-10-CM | POA: Diagnosis not present

## 2017-05-07 DIAGNOSIS — R1310 Dysphagia, unspecified: Secondary | ICD-10-CM | POA: Diagnosis not present

## 2017-05-07 DIAGNOSIS — G309 Alzheimer's disease, unspecified: Secondary | ICD-10-CM | POA: Diagnosis not present

## 2017-05-07 DIAGNOSIS — I10 Essential (primary) hypertension: Secondary | ICD-10-CM | POA: Diagnosis not present

## 2017-05-07 DIAGNOSIS — E46 Unspecified protein-calorie malnutrition: Secondary | ICD-10-CM | POA: Diagnosis not present

## 2017-05-10 DIAGNOSIS — E46 Unspecified protein-calorie malnutrition: Secondary | ICD-10-CM | POA: Diagnosis not present

## 2017-05-10 DIAGNOSIS — I10 Essential (primary) hypertension: Secondary | ICD-10-CM | POA: Diagnosis not present

## 2017-05-10 DIAGNOSIS — G9340 Encephalopathy, unspecified: Secondary | ICD-10-CM | POA: Diagnosis not present

## 2017-05-10 DIAGNOSIS — R1310 Dysphagia, unspecified: Secondary | ICD-10-CM | POA: Diagnosis not present

## 2017-05-10 DIAGNOSIS — G309 Alzheimer's disease, unspecified: Secondary | ICD-10-CM | POA: Diagnosis not present

## 2017-05-10 DIAGNOSIS — R634 Abnormal weight loss: Secondary | ICD-10-CM | POA: Diagnosis not present

## 2017-05-13 ENCOUNTER — Encounter (HOSPITAL_COMMUNITY): Payer: Self-pay | Admitting: Emergency Medicine

## 2017-05-13 ENCOUNTER — Emergency Department (HOSPITAL_COMMUNITY)
Admission: EM | Admit: 2017-05-13 | Discharge: 2017-05-13 | Disposition: A | Discharge status: Home / Self Care | Attending: Emergency Medicine | Admitting: Emergency Medicine

## 2017-05-13 ENCOUNTER — Emergency Department (HOSPITAL_COMMUNITY)

## 2017-05-13 DIAGNOSIS — E860 Dehydration: Secondary | ICD-10-CM | POA: Diagnosis not present

## 2017-05-13 DIAGNOSIS — I10 Essential (primary) hypertension: Secondary | ICD-10-CM | POA: Diagnosis not present

## 2017-05-13 DIAGNOSIS — Z79899 Other long term (current) drug therapy: Secondary | ICD-10-CM | POA: Insufficient documentation

## 2017-05-13 DIAGNOSIS — R059 Cough, unspecified: Secondary | ICD-10-CM

## 2017-05-13 DIAGNOSIS — R1310 Dysphagia, unspecified: Secondary | ICD-10-CM | POA: Diagnosis not present

## 2017-05-13 DIAGNOSIS — E46 Unspecified protein-calorie malnutrition: Secondary | ICD-10-CM | POA: Diagnosis not present

## 2017-05-13 DIAGNOSIS — G309 Alzheimer's disease, unspecified: Secondary | ICD-10-CM | POA: Diagnosis not present

## 2017-05-13 DIAGNOSIS — F0390 Unspecified dementia without behavioral disturbance: Secondary | ICD-10-CM | POA: Diagnosis not present

## 2017-05-13 DIAGNOSIS — R05 Cough: Secondary | ICD-10-CM | POA: Diagnosis not present

## 2017-05-13 DIAGNOSIS — R634 Abnormal weight loss: Secondary | ICD-10-CM | POA: Diagnosis not present

## 2017-05-13 DIAGNOSIS — G9340 Encephalopathy, unspecified: Secondary | ICD-10-CM | POA: Diagnosis not present

## 2017-05-13 LAB — CBC WITH DIFFERENTIAL/PLATELET
BASOS ABS: 0 10*3/uL (ref 0.0–0.1)
Basophils Relative: 0 %
EOS PCT: 1 %
Eosinophils Absolute: 0.1 10*3/uL (ref 0.0–0.7)
HCT: 48.3 % (ref 39.0–52.0)
Hemoglobin: 14.6 g/dL (ref 13.0–17.0)
LYMPHS PCT: 24 %
Lymphs Abs: 1.9 10*3/uL (ref 0.7–4.0)
MCH: 28.8 pg (ref 26.0–34.0)
MCHC: 30.2 g/dL (ref 30.0–36.0)
MCV: 95.3 fL (ref 78.0–100.0)
MONO ABS: 0.9 10*3/uL (ref 0.1–1.0)
Monocytes Relative: 11 %
Neutro Abs: 5 10*3/uL (ref 1.7–7.7)
Neutrophils Relative %: 64 %
PLATELETS: 107 10*3/uL — AB (ref 150–400)
RBC: 5.07 MIL/uL (ref 4.22–5.81)
RDW: 15.1 % (ref 11.5–15.5)
WBC: 7.8 10*3/uL (ref 4.0–10.5)

## 2017-05-13 LAB — COMPREHENSIVE METABOLIC PANEL
ALT: 16 U/L — ABNORMAL LOW (ref 17–63)
ANION GAP: 10 (ref 5–15)
AST: 29 U/L (ref 15–41)
Albumin: 3.2 g/dL — ABNORMAL LOW (ref 3.5–5.0)
Alkaline Phosphatase: 38 U/L (ref 38–126)
BUN: 28 mg/dL — ABNORMAL HIGH (ref 6–20)
CHLORIDE: 110 mmol/L (ref 101–111)
CO2: 26 mmol/L (ref 22–32)
Calcium: 8.8 mg/dL — ABNORMAL LOW (ref 8.9–10.3)
Creatinine, Ser: 1.85 mg/dL — ABNORMAL HIGH (ref 0.61–1.24)
GFR, EST AFRICAN AMERICAN: 39 mL/min — AB (ref >60)
GFR, EST NON AFRICAN AMERICAN: 33 mL/min — AB (ref >60)
Glucose, Bld: 97 mg/dL (ref 65–99)
POTASSIUM: 4.2 mmol/L (ref 3.5–5.1)
Sodium: 146 mmol/L — ABNORMAL HIGH (ref 135–145)
Total Bilirubin: 0.4 mg/dL (ref 0.3–1.2)
Total Protein: 7.9 g/dL (ref 6.5–8.1)

## 2017-05-13 MED ORDER — AZITHROMYCIN 250 MG PO TABS: 250.0000 mg | ORAL_TABLET | Freq: Every day | ORAL | 0 refills | Status: DC

## 2017-05-13 MED ORDER — BENZONATATE 100 MG PO CAPS: 100.0000 mg | ORAL_CAPSULE | Freq: Two times a day (BID) | ORAL | 0 refills | Status: DC | PRN

## 2017-05-13 NOTE — ED Triage Notes (Signed)
Family reported persistent productive cough onset today with chest congestion , denies fever or chills .

## 2017-05-14 DIAGNOSIS — R634 Abnormal weight loss: Secondary | ICD-10-CM | POA: Diagnosis not present

## 2017-05-14 DIAGNOSIS — G9340 Encephalopathy, unspecified: Secondary | ICD-10-CM | POA: Diagnosis not present

## 2017-05-14 DIAGNOSIS — E46 Unspecified protein-calorie malnutrition: Secondary | ICD-10-CM | POA: Diagnosis not present

## 2017-05-14 DIAGNOSIS — G309 Alzheimer's disease, unspecified: Secondary | ICD-10-CM | POA: Diagnosis not present

## 2017-05-14 DIAGNOSIS — R1310 Dysphagia, unspecified: Secondary | ICD-10-CM | POA: Diagnosis not present

## 2017-05-14 DIAGNOSIS — I10 Essential (primary) hypertension: Secondary | ICD-10-CM | POA: Diagnosis not present

## 2017-05-14 NOTE — ED Provider Notes (Signed)
[facility]ENCY DEPT Provider Note   CSN: [id number] Arrival date & time: 05/13/17  1936     History   Chief Complaint Chief Complaint  Patient presents with  . Cough   LEVEL 5 CAVEAT DUE TO DEMENTIA  HPI Donald [name] is a 76 y.o. male.  The history is provided by the spouse and the patient. The history is limited by the condition of the patient.  Cough  This is a new problem. The current episode started 12 to 24 hours ago. The problem occurs every few minutes. The problem has been gradually worsening. The cough is non-productive. There has been no fever. Associated symptoms include chills. Pertinent negatives include no shortness of breath. He has tried nothing for the symptoms.   Patient with h/o dementia presents with cough, congestion, rhinorrhea for past day No fever/vomiting He reports chills History very limited from patient due to dementia Wife provides all history No other acute complaints Pt is on hospice Past Medical History:  Diagnosis Date  . Dementia   . Hypertension     Patient Active Problem List   Diagnosis Date Noted  . Dementia with behavioral disturbance 12/15/2015  . Tonic seizure (HCC) 12/15/2015  . Altered mental status 10/29/2015  . Elevated lactic acid level 10/29/2015  . Encephalopathy acute 10/29/2015  . Enlarged prostate 10/29/2015  . Hypertension 10/29/2015  . Dementia   . 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 NO PAST SURGERIES         Home Medications    Prior to Admission medications   Medication Sig Start Date End Date Taking? Authorizing Provider  amLODipine (NORVASC) 10 MG tablet Take 10 mg by mouth daily.     [provider]  azithromycin (ZITHROMAX) 250 MG tablet Take 1 tablet (250 mg total) by mouth daily. Take first 2 tablets together, then 1 every day until finished. 05/13/17   Zadie Rhine, MD  benzonatate (TESSALON) 100 MG  capsule Take 1 capsule (100 mg total) by mouth 2 (two) times daily as needed for cough. 05/13/17   Zadie Rhine, MD  divalproex (DEPAKOTE ER) 500 MG 24 hr tablet Take 500-1,000 mg by mouth. Take one tablet qam, take 2 tablets every evening    [provider]  donepezil (ARICEPT) 23 MG TABS tablet TAKE 1 TABLET BY MOUTH AT BEDTIME 11/18/16   Butch Penny, NP  finasteride (PROSCAR) 5 MG tablet Take 5 mg by mouth daily. 10/02/14   [provider]  mirtazapine (REMERON) 15 MG tablet Take 15 mg by mouth at bedtime.    [provider]  valsartan (DIOVAN) 320 MG tablet Take 320 mg by mouth daily.    [provider]    Family History Family History  Problem Relation Age of Onset  . Hypertension Mother   . Dementia Sister     Social History Social History  Substance Use Topics  . Smoking status: Never Smoker  . Smokeless tobacco: Never Used  . Alcohol use No     Allergies   Patient has no known allergies.   Review of Systems Review of Systems  Unable to perform ROS: Dementia  Constitutional: Positive for chills.  Respiratory: Positive for cough. Negative for shortness of breath.      Physical Exam Updated Vital Signs BP 122/83   Pulse 85   Temp 98.3 F (36.8 C)   Resp 16   Ht 1.753 m ( )   Wt  67.6 kg (149 lb)   SpO2 97%   BMI 22.00 kg/m   Physical Exam CONSTITUTIONAL: Elderly, frail, no distress HEAD: Normocephalic/atraumatic EYES: EOMI ENMT: Mucous membranes moist, rhinorrhea noted NECK: supple no meningeal signs SPINE/BACK:entire spine nontender CV: S1/S2 noted LUNGS: Lungs are clear to auscultation bilaterally, no apparent distress ABDOMEN: soft, nontender NEURO: Pt is awake/alert, moves all extremitiesx4.  Pt is pleasantly confused EXTREMITIES: pulses normal/equal, full ROM, no LE edema SKIN: warm, color normal   ED Treatments / Results  Labs (all labs ordered are listed, but only abnormal results are  displayed) Labs Reviewed  CBC WITH DIFFERENTIAL/PLATELET - Abnormal; Notable for the following:       Result Value   Platelets 107 (*)    All other components within normal limits  COMPREHENSIVE METABOLIC PANEL - Abnormal; Notable for the following:    Sodium 146 (*)    BUN 28 (*)    Creatinine, Ser 1.85 (*)    Calcium 8.8 (*)    Albumin 3.2 (*)    ALT 16 (*)    GFR calc non Af Amer 33 (*)    GFR calc Af Amer 39 (*)    All other components within normal limits    EKG  EKG Interpretation None       Radiology Dg Chest 2 View  Result Date: 05/13/2017 CLINICAL DATA:  Acute onset of persistent productive cough. Initial encounter. EXAM: CHEST  2 VIEW COMPARISON:  Chest radiograph performed 10/29/2015 FINDINGS: The lungs are well-aerated and clear. There is no evidence of focal opacification, pleural effusion or pneumothorax. The lung apices are partially obscured by the patient's head. The heart is normal in size; the mediastinal contour is within normal limits. No acute osseous abnormalities are seen. IMPRESSION: No acute cardiopulmonary process seen. Electronically Signed   By: Roanna Raider M.D.   On: 05/13/2017 20:40    Procedures Procedures (including critical care time)  Medications Ordered in ED Medications - No data to display   Initial Impression / Assessment and Plan / ED Course  I have reviewed the triage vital signs and the nursing notes.  Pertinent labs & imaging results that were available during my care of the patient were reviewed by me and considered in my medical decision making (see chart for details).     Pt well appearing No distress Due to age/debility, I offered oral antibiotics in case cough worsens over next 24 hours, but advised to wait until tomorrow to start as he may improve spontaneously Also offered tessalon for cough as wife reports he coughs frequently at night which wakes him up but he is not candidate for other anti-tussives  Pt  appropriate for d/c home  Final Clinical Impressions(s) / ED Diagnoses   Final diagnoses:  Cough  Dehydration    New Prescriptions Discharge Medication List as of 05/13/2017 11:23 PM    START taking these medications   Details  azithromycin (ZITHROMAX) 250 MG tablet Take 1 tablet (250 mg total) by mouth daily. Take first 2 tablets together, then 1 every day until finished., Starting Tue 05/13/2017, Print    benzonatate (TESSALON) 100 MG capsule Take 1 capsule (100 mg total) by mouth 2 (two) times daily as needed for cough., Starting Tue 05/13/2017, Print         Zadie Rhine, MD 05/14/17 [phone]

## 2017-05-17 DIAGNOSIS — R634 Abnormal weight loss: Secondary | ICD-10-CM | POA: Diagnosis not present

## 2017-05-17 DIAGNOSIS — I10 Essential (primary) hypertension: Secondary | ICD-10-CM | POA: Diagnosis not present

## 2017-05-17 DIAGNOSIS — G9340 Encephalopathy, unspecified: Secondary | ICD-10-CM | POA: Diagnosis not present

## 2017-05-17 DIAGNOSIS — E46 Unspecified protein-calorie malnutrition: Secondary | ICD-10-CM | POA: Diagnosis not present

## 2017-05-17 DIAGNOSIS — R1310 Dysphagia, unspecified: Secondary | ICD-10-CM | POA: Diagnosis not present

## 2017-05-17 DIAGNOSIS — G309 Alzheimer's disease, unspecified: Secondary | ICD-10-CM | POA: Diagnosis not present

## 2017-05-19 DIAGNOSIS — R634 Abnormal weight loss: Secondary | ICD-10-CM | POA: Diagnosis not present

## 2017-05-19 DIAGNOSIS — G9340 Encephalopathy, unspecified: Secondary | ICD-10-CM | POA: Diagnosis not present

## 2017-05-19 DIAGNOSIS — G309 Alzheimer's disease, unspecified: Secondary | ICD-10-CM | POA: Diagnosis not present

## 2017-05-19 DIAGNOSIS — E46 Unspecified protein-calorie malnutrition: Secondary | ICD-10-CM | POA: Diagnosis not present

## 2017-05-19 DIAGNOSIS — I10 Essential (primary) hypertension: Secondary | ICD-10-CM | POA: Diagnosis not present

## 2017-05-19 DIAGNOSIS — R1310 Dysphagia, unspecified: Secondary | ICD-10-CM | POA: Diagnosis not present

## 2017-05-21 DIAGNOSIS — R634 Abnormal weight loss: Secondary | ICD-10-CM | POA: Diagnosis not present

## 2017-05-21 DIAGNOSIS — I10 Essential (primary) hypertension: Secondary | ICD-10-CM | POA: Diagnosis not present

## 2017-05-21 DIAGNOSIS — G9340 Encephalopathy, unspecified: Secondary | ICD-10-CM | POA: Diagnosis not present

## 2017-05-21 DIAGNOSIS — E46 Unspecified protein-calorie malnutrition: Secondary | ICD-10-CM | POA: Diagnosis not present

## 2017-05-21 DIAGNOSIS — R1310 Dysphagia, unspecified: Secondary | ICD-10-CM | POA: Diagnosis not present

## 2017-05-21 DIAGNOSIS — G309 Alzheimer's disease, unspecified: Secondary | ICD-10-CM | POA: Diagnosis not present

## 2017-05-24 DIAGNOSIS — G9340 Encephalopathy, unspecified: Secondary | ICD-10-CM | POA: Diagnosis not present

## 2017-05-24 DIAGNOSIS — R1310 Dysphagia, unspecified: Secondary | ICD-10-CM | POA: Diagnosis not present

## 2017-05-24 DIAGNOSIS — R634 Abnormal weight loss: Secondary | ICD-10-CM | POA: Diagnosis not present

## 2017-05-24 DIAGNOSIS — I10 Essential (primary) hypertension: Secondary | ICD-10-CM | POA: Diagnosis not present

## 2017-05-24 DIAGNOSIS — G309 Alzheimer's disease, unspecified: Secondary | ICD-10-CM | POA: Diagnosis not present

## 2017-05-24 DIAGNOSIS — E46 Unspecified protein-calorie malnutrition: Secondary | ICD-10-CM | POA: Diagnosis not present

## 2017-05-28 DIAGNOSIS — E46 Unspecified protein-calorie malnutrition: Secondary | ICD-10-CM | POA: Diagnosis not present

## 2017-05-28 DIAGNOSIS — R1310 Dysphagia, unspecified: Secondary | ICD-10-CM | POA: Diagnosis not present

## 2017-05-28 DIAGNOSIS — G309 Alzheimer's disease, unspecified: Secondary | ICD-10-CM | POA: Diagnosis not present

## 2017-05-28 DIAGNOSIS — I10 Essential (primary) hypertension: Secondary | ICD-10-CM | POA: Diagnosis not present

## 2017-05-28 DIAGNOSIS — R634 Abnormal weight loss: Secondary | ICD-10-CM | POA: Diagnosis not present

## 2017-05-28 DIAGNOSIS — G9340 Encephalopathy, unspecified: Secondary | ICD-10-CM | POA: Diagnosis not present

## 2017-05-31 DIAGNOSIS — R634 Abnormal weight loss: Secondary | ICD-10-CM | POA: Diagnosis not present

## 2017-05-31 DIAGNOSIS — I10 Essential (primary) hypertension: Secondary | ICD-10-CM | POA: Diagnosis not present

## 2017-05-31 DIAGNOSIS — G9340 Encephalopathy, unspecified: Secondary | ICD-10-CM | POA: Diagnosis not present

## 2017-05-31 DIAGNOSIS — R1310 Dysphagia, unspecified: Secondary | ICD-10-CM | POA: Diagnosis not present

## 2017-05-31 DIAGNOSIS — E46 Unspecified protein-calorie malnutrition: Secondary | ICD-10-CM | POA: Diagnosis not present

## 2017-05-31 DIAGNOSIS — G309 Alzheimer's disease, unspecified: Secondary | ICD-10-CM | POA: Diagnosis not present

## 2017-06-04 DIAGNOSIS — G309 Alzheimer's disease, unspecified: Secondary | ICD-10-CM | POA: Diagnosis not present

## 2017-06-04 DIAGNOSIS — R1310 Dysphagia, unspecified: Secondary | ICD-10-CM | POA: Diagnosis not present

## 2017-06-04 DIAGNOSIS — R634 Abnormal weight loss: Secondary | ICD-10-CM | POA: Diagnosis not present

## 2017-06-04 DIAGNOSIS — I10 Essential (primary) hypertension: Secondary | ICD-10-CM | POA: Diagnosis not present

## 2017-06-04 DIAGNOSIS — G9340 Encephalopathy, unspecified: Secondary | ICD-10-CM | POA: Diagnosis not present

## 2017-06-04 DIAGNOSIS — E46 Unspecified protein-calorie malnutrition: Secondary | ICD-10-CM | POA: Diagnosis not present

## 2017-06-05 DIAGNOSIS — E46 Unspecified protein-calorie malnutrition: Secondary | ICD-10-CM | POA: Diagnosis not present

## 2017-06-05 DIAGNOSIS — G9340 Encephalopathy, unspecified: Secondary | ICD-10-CM | POA: Diagnosis not present

## 2017-06-05 DIAGNOSIS — G309 Alzheimer's disease, unspecified: Secondary | ICD-10-CM | POA: Diagnosis not present

## 2017-06-05 DIAGNOSIS — N401 Enlarged prostate with lower urinary tract symptoms: Secondary | ICD-10-CM | POA: Diagnosis not present

## 2017-06-05 DIAGNOSIS — R1310 Dysphagia, unspecified: Secondary | ICD-10-CM | POA: Diagnosis not present

## 2017-06-05 DIAGNOSIS — R634 Abnormal weight loss: Secondary | ICD-10-CM | POA: Diagnosis not present

## 2017-06-05 DIAGNOSIS — I10 Essential (primary) hypertension: Secondary | ICD-10-CM | POA: Diagnosis not present

## 2017-06-07 DIAGNOSIS — G309 Alzheimer's disease, unspecified: Secondary | ICD-10-CM | POA: Diagnosis not present

## 2017-06-07 DIAGNOSIS — R1310 Dysphagia, unspecified: Secondary | ICD-10-CM | POA: Diagnosis not present

## 2017-06-07 DIAGNOSIS — R634 Abnormal weight loss: Secondary | ICD-10-CM | POA: Diagnosis not present

## 2017-06-07 DIAGNOSIS — G9340 Encephalopathy, unspecified: Secondary | ICD-10-CM | POA: Diagnosis not present

## 2017-06-07 DIAGNOSIS — E46 Unspecified protein-calorie malnutrition: Secondary | ICD-10-CM | POA: Diagnosis not present

## 2017-06-07 DIAGNOSIS — I10 Essential (primary) hypertension: Secondary | ICD-10-CM | POA: Diagnosis not present

## 2017-06-09 DIAGNOSIS — R634 Abnormal weight loss: Secondary | ICD-10-CM | POA: Diagnosis not present

## 2017-06-09 DIAGNOSIS — K5900 Constipation, unspecified: Secondary | ICD-10-CM | POA: Diagnosis not present

## 2017-06-09 DIAGNOSIS — G309 Alzheimer's disease, unspecified: Secondary | ICD-10-CM | POA: Diagnosis not present

## 2017-06-09 DIAGNOSIS — G9340 Encephalopathy, unspecified: Secondary | ICD-10-CM | POA: Diagnosis not present

## 2017-06-09 DIAGNOSIS — I10 Essential (primary) hypertension: Secondary | ICD-10-CM | POA: Diagnosis not present

## 2017-06-09 DIAGNOSIS — E46 Unspecified protein-calorie malnutrition: Secondary | ICD-10-CM | POA: Diagnosis not present

## 2017-06-09 DIAGNOSIS — R1310 Dysphagia, unspecified: Secondary | ICD-10-CM | POA: Diagnosis not present

## 2017-06-10 DIAGNOSIS — R634 Abnormal weight loss: Secondary | ICD-10-CM | POA: Diagnosis not present

## 2017-06-10 DIAGNOSIS — E46 Unspecified protein-calorie malnutrition: Secondary | ICD-10-CM | POA: Diagnosis not present

## 2017-06-10 DIAGNOSIS — R1310 Dysphagia, unspecified: Secondary | ICD-10-CM | POA: Diagnosis not present

## 2017-06-10 DIAGNOSIS — G9340 Encephalopathy, unspecified: Secondary | ICD-10-CM | POA: Diagnosis not present

## 2017-06-10 DIAGNOSIS — I10 Essential (primary) hypertension: Secondary | ICD-10-CM | POA: Diagnosis not present

## 2017-06-10 DIAGNOSIS — G309 Alzheimer's disease, unspecified: Secondary | ICD-10-CM | POA: Diagnosis not present

## 2017-06-11 DIAGNOSIS — R1310 Dysphagia, unspecified: Secondary | ICD-10-CM | POA: Diagnosis not present

## 2017-06-11 DIAGNOSIS — G309 Alzheimer's disease, unspecified: Secondary | ICD-10-CM | POA: Diagnosis not present

## 2017-06-11 DIAGNOSIS — G9340 Encephalopathy, unspecified: Secondary | ICD-10-CM | POA: Diagnosis not present

## 2017-06-11 DIAGNOSIS — I10 Essential (primary) hypertension: Secondary | ICD-10-CM | POA: Diagnosis not present

## 2017-06-11 DIAGNOSIS — E46 Unspecified protein-calorie malnutrition: Secondary | ICD-10-CM | POA: Diagnosis not present

## 2017-06-11 DIAGNOSIS — R634 Abnormal weight loss: Secondary | ICD-10-CM | POA: Diagnosis not present

## 2017-06-12 DIAGNOSIS — R634 Abnormal weight loss: Secondary | ICD-10-CM | POA: Diagnosis not present

## 2017-06-12 DIAGNOSIS — G9340 Encephalopathy, unspecified: Secondary | ICD-10-CM | POA: Diagnosis not present

## 2017-06-12 DIAGNOSIS — E46 Unspecified protein-calorie malnutrition: Secondary | ICD-10-CM | POA: Diagnosis not present

## 2017-06-12 DIAGNOSIS — G309 Alzheimer's disease, unspecified: Secondary | ICD-10-CM | POA: Diagnosis not present

## 2017-06-12 DIAGNOSIS — R1310 Dysphagia, unspecified: Secondary | ICD-10-CM | POA: Diagnosis not present

## 2017-06-12 DIAGNOSIS — I10 Essential (primary) hypertension: Secondary | ICD-10-CM | POA: Diagnosis not present

## 2017-06-14 DIAGNOSIS — R634 Abnormal weight loss: Secondary | ICD-10-CM | POA: Diagnosis not present

## 2017-06-14 DIAGNOSIS — R1310 Dysphagia, unspecified: Secondary | ICD-10-CM | POA: Diagnosis not present

## 2017-06-14 DIAGNOSIS — G309 Alzheimer's disease, unspecified: Secondary | ICD-10-CM | POA: Diagnosis not present

## 2017-06-14 DIAGNOSIS — I10 Essential (primary) hypertension: Secondary | ICD-10-CM | POA: Diagnosis not present

## 2017-06-14 DIAGNOSIS — E46 Unspecified protein-calorie malnutrition: Secondary | ICD-10-CM | POA: Diagnosis not present

## 2017-06-14 DIAGNOSIS — G9340 Encephalopathy, unspecified: Secondary | ICD-10-CM | POA: Diagnosis not present

## 2017-06-15 DIAGNOSIS — E46 Unspecified protein-calorie malnutrition: Secondary | ICD-10-CM | POA: Diagnosis not present

## 2017-06-15 DIAGNOSIS — R1310 Dysphagia, unspecified: Secondary | ICD-10-CM | POA: Diagnosis not present

## 2017-06-15 DIAGNOSIS — G309 Alzheimer's disease, unspecified: Secondary | ICD-10-CM | POA: Diagnosis not present

## 2017-06-15 DIAGNOSIS — I10 Essential (primary) hypertension: Secondary | ICD-10-CM | POA: Diagnosis not present

## 2017-06-15 DIAGNOSIS — R634 Abnormal weight loss: Secondary | ICD-10-CM | POA: Diagnosis not present

## 2017-06-15 DIAGNOSIS — G9340 Encephalopathy, unspecified: Secondary | ICD-10-CM | POA: Diagnosis not present

## 2017-06-16 DIAGNOSIS — I10 Essential (primary) hypertension: Secondary | ICD-10-CM | POA: Diagnosis not present

## 2017-06-16 DIAGNOSIS — E46 Unspecified protein-calorie malnutrition: Secondary | ICD-10-CM | POA: Diagnosis not present

## 2017-06-16 DIAGNOSIS — G9340 Encephalopathy, unspecified: Secondary | ICD-10-CM | POA: Diagnosis not present

## 2017-06-16 DIAGNOSIS — R1310 Dysphagia, unspecified: Secondary | ICD-10-CM | POA: Diagnosis not present

## 2017-06-16 DIAGNOSIS — G309 Alzheimer's disease, unspecified: Secondary | ICD-10-CM | POA: Diagnosis not present

## 2017-06-16 DIAGNOSIS — R634 Abnormal weight loss: Secondary | ICD-10-CM | POA: Diagnosis not present

## 2017-06-17 ENCOUNTER — Encounter: Payer: Self-pay | Admitting: Internal Medicine

## 2017-06-17 ENCOUNTER — Non-Acute Institutional Stay (SKILLED_NURSING_FACILITY): Admitting: Internal Medicine

## 2017-06-17 DIAGNOSIS — G309 Alzheimer's disease, unspecified: Secondary | ICD-10-CM | POA: Diagnosis not present

## 2017-06-17 DIAGNOSIS — R1310 Dysphagia, unspecified: Secondary | ICD-10-CM | POA: Diagnosis not present

## 2017-06-17 DIAGNOSIS — R634 Abnormal weight loss: Secondary | ICD-10-CM | POA: Diagnosis not present

## 2017-06-17 DIAGNOSIS — F0280 Dementia in other diseases classified elsewhere without behavioral disturbance: Secondary | ICD-10-CM | POA: Diagnosis not present

## 2017-06-17 DIAGNOSIS — G4089 Other seizures: Secondary | ICD-10-CM

## 2017-06-17 DIAGNOSIS — G9340 Encephalopathy, unspecified: Secondary | ICD-10-CM | POA: Diagnosis not present

## 2017-06-17 DIAGNOSIS — I10 Essential (primary) hypertension: Secondary | ICD-10-CM

## 2017-06-17 DIAGNOSIS — E46 Unspecified protein-calorie malnutrition: Secondary | ICD-10-CM | POA: Diagnosis not present

## 2017-06-17 NOTE — Assessment & Plan Note (Signed)
He continues on Aricept, its benefit is questionable clinically but will be continued

## 2017-06-17 NOTE — Assessment & Plan Note (Signed)
No renal dosing adjustment needed ; check free valproic acid level should there be recurrent seizure;

## 2017-06-17 NOTE — Assessment & Plan Note (Addendum)
The creatinine of 1.85 is of concern with the high-dose ARB. While here the losartan will be decreased to 50 mg daily and blood pressure monitored

## 2017-06-17 NOTE — Progress Notes (Signed)
    NURSING HOME LOCATION:  [facility] ROOM NUMBER:  [id number]  CODE STATUS:  DNR  PCP:  Pearson GrippeKim, [name], MD  [address] [place] [id number]   This is a  Hospice/Respite admission note to [facility] performed on this date less than 30 days from date of admission. Included are preadmission medical/surgical history;reconciled medication list; family history; social history and comprehensive review of systems.  Corrections and additions to the records were documented . Comprehensive physical exam was also performed. Additionally a clinical summary was entered for each active diagnosis pertinent to this admission in the Problem List to enhance continuity of care.  HPI: The patient here is here for respite admission  in the context of dementia with history of dysphagia, essential hypertension, tonic seizures & encephalopathy. Although he is nonverbal he is ambulatory and wears an anti-wandering bracelet. He is on dual agents for hypertension, a calcium channel blocker and ARB. He has no history of prior surgeries. Family history includes dementia in a sister and hypertension in his mother. According the chart as patient never smoked or drunk alcohol. Labs are current. On 05/13/17 sodium was 146, BUN 28, and creatinine 1.85, albumin 3.2, GFR 39, and platelet count 107,000. Intra-aortic creatinine 1.32 and GFR 60. The labs were done in the ED when he was seen for cough for which he received Zithromax and Tessalon. He was diagnosed with dehydration.  Review of systems:Could not be completed due to dementia and nonverbal state. It was noted that the NP Student did receive occasional monosyllabic reply such as yes or no. When asked his children's age his answer was "I don't know"    Physical exam:  Pertinent or positive findings:He was resisted exam of the eyes and mouth. He followed no commands for me but did such for the student. Ptosis noted on the left, he has  intermittent esotropia on the right.  His breath sounds are decreased, he had low-grade rhonchi and rales without increased work of breathing.Trace edema was noted @ the sock line. He has decreased pedal pulses. There is slight clubbing the nailbeds.   General appearance:Adequately nourished; no acute distress , increased work of breathing is present.   Lymphatic: No lymphadenopathy about the head, neck, axilla . Eyes: No conjunctival inflammation or lid edema is present. There is no scleral icterus. Ears:  External ear exam shows no significant lesions or deformities.   Nose:  External nasal examination shows no deformity or inflammation. Nasal mucosa are pink and moist without lesions ,exudates Oral exam: lips  are healthy appearing. Neck:  No thyromegaly, masses, tenderness noted.    Heart:  Normal rate and regular rhythm. S1 and S2 normal without gallop, murmur, click, rub .  Abdomen:Bowel sounds are normal. Abdomen is soft and nontender with no organomegaly, hernias,masses. GU: deferred  Extremities:  No cyanosis  Neurologic exam : Strength, balance,Rhomberg,finger to nose testing could not be completed due to clinical state Skin: Warm & dry w/o tenting. No significant lesions or rash.  See clinical summary under each active problem in the Problem List with associated updated therapeutic plan

## 2017-06-17 NOTE — Patient Instructions (Signed)
See assessment and plan under each diagnosis in the problem list and acutely for this visit 

## 2017-06-21 DIAGNOSIS — G309 Alzheimer's disease, unspecified: Secondary | ICD-10-CM | POA: Diagnosis not present

## 2017-06-21 DIAGNOSIS — R634 Abnormal weight loss: Secondary | ICD-10-CM | POA: Diagnosis not present

## 2017-06-21 DIAGNOSIS — E46 Unspecified protein-calorie malnutrition: Secondary | ICD-10-CM | POA: Diagnosis not present

## 2017-06-21 DIAGNOSIS — R1310 Dysphagia, unspecified: Secondary | ICD-10-CM | POA: Diagnosis not present

## 2017-06-21 DIAGNOSIS — I10 Essential (primary) hypertension: Secondary | ICD-10-CM | POA: Diagnosis not present

## 2017-06-21 DIAGNOSIS — G9340 Encephalopathy, unspecified: Secondary | ICD-10-CM | POA: Diagnosis not present

## 2017-06-22 DIAGNOSIS — I10 Essential (primary) hypertension: Secondary | ICD-10-CM | POA: Diagnosis not present

## 2017-06-22 DIAGNOSIS — R634 Abnormal weight loss: Secondary | ICD-10-CM | POA: Diagnosis not present

## 2017-06-22 DIAGNOSIS — G9340 Encephalopathy, unspecified: Secondary | ICD-10-CM | POA: Diagnosis not present

## 2017-06-22 DIAGNOSIS — E46 Unspecified protein-calorie malnutrition: Secondary | ICD-10-CM | POA: Diagnosis not present

## 2017-06-22 DIAGNOSIS — G309 Alzheimer's disease, unspecified: Secondary | ICD-10-CM | POA: Diagnosis not present

## 2017-06-22 DIAGNOSIS — R1310 Dysphagia, unspecified: Secondary | ICD-10-CM | POA: Diagnosis not present

## 2017-06-23 DIAGNOSIS — R634 Abnormal weight loss: Secondary | ICD-10-CM | POA: Diagnosis not present

## 2017-06-23 DIAGNOSIS — G9340 Encephalopathy, unspecified: Secondary | ICD-10-CM | POA: Diagnosis not present

## 2017-06-23 DIAGNOSIS — I10 Essential (primary) hypertension: Secondary | ICD-10-CM | POA: Diagnosis not present

## 2017-06-23 DIAGNOSIS — G309 Alzheimer's disease, unspecified: Secondary | ICD-10-CM | POA: Diagnosis not present

## 2017-06-23 DIAGNOSIS — E46 Unspecified protein-calorie malnutrition: Secondary | ICD-10-CM | POA: Diagnosis not present

## 2017-06-23 DIAGNOSIS — R1310 Dysphagia, unspecified: Secondary | ICD-10-CM | POA: Diagnosis not present

## 2017-06-28 DIAGNOSIS — R1310 Dysphagia, unspecified: Secondary | ICD-10-CM | POA: Diagnosis not present

## 2017-06-28 DIAGNOSIS — R634 Abnormal weight loss: Secondary | ICD-10-CM | POA: Diagnosis not present

## 2017-06-28 DIAGNOSIS — I10 Essential (primary) hypertension: Secondary | ICD-10-CM | POA: Diagnosis not present

## 2017-06-28 DIAGNOSIS — G309 Alzheimer's disease, unspecified: Secondary | ICD-10-CM | POA: Diagnosis not present

## 2017-06-28 DIAGNOSIS — E46 Unspecified protein-calorie malnutrition: Secondary | ICD-10-CM | POA: Diagnosis not present

## 2017-06-28 DIAGNOSIS — G9340 Encephalopathy, unspecified: Secondary | ICD-10-CM | POA: Diagnosis not present

## 2017-07-04 DIAGNOSIS — R1310 Dysphagia, unspecified: Secondary | ICD-10-CM | POA: Diagnosis not present

## 2017-07-04 DIAGNOSIS — G309 Alzheimer's disease, unspecified: Secondary | ICD-10-CM | POA: Diagnosis not present

## 2017-07-04 DIAGNOSIS — G9340 Encephalopathy, unspecified: Secondary | ICD-10-CM | POA: Diagnosis not present

## 2017-07-04 DIAGNOSIS — I10 Essential (primary) hypertension: Secondary | ICD-10-CM | POA: Diagnosis not present

## 2017-07-04 DIAGNOSIS — R634 Abnormal weight loss: Secondary | ICD-10-CM | POA: Diagnosis not present

## 2017-07-04 DIAGNOSIS — E46 Unspecified protein-calorie malnutrition: Secondary | ICD-10-CM | POA: Diagnosis not present

## 2017-07-05 DIAGNOSIS — I10 Essential (primary) hypertension: Secondary | ICD-10-CM | POA: Diagnosis not present

## 2017-07-05 DIAGNOSIS — R1310 Dysphagia, unspecified: Secondary | ICD-10-CM | POA: Diagnosis not present

## 2017-07-05 DIAGNOSIS — N401 Enlarged prostate with lower urinary tract symptoms: Secondary | ICD-10-CM | POA: Diagnosis not present

## 2017-07-05 DIAGNOSIS — R634 Abnormal weight loss: Secondary | ICD-10-CM | POA: Diagnosis not present

## 2017-07-05 DIAGNOSIS — E46 Unspecified protein-calorie malnutrition: Secondary | ICD-10-CM | POA: Diagnosis not present

## 2017-07-05 DIAGNOSIS — G309 Alzheimer's disease, unspecified: Secondary | ICD-10-CM | POA: Diagnosis not present

## 2017-07-05 DIAGNOSIS — G9340 Encephalopathy, unspecified: Secondary | ICD-10-CM | POA: Diagnosis not present

## 2017-07-11 DIAGNOSIS — G309 Alzheimer's disease, unspecified: Secondary | ICD-10-CM | POA: Diagnosis not present

## 2017-07-11 DIAGNOSIS — R634 Abnormal weight loss: Secondary | ICD-10-CM | POA: Diagnosis not present

## 2017-07-11 DIAGNOSIS — E46 Unspecified protein-calorie malnutrition: Secondary | ICD-10-CM | POA: Diagnosis not present

## 2017-07-11 DIAGNOSIS — R1310 Dysphagia, unspecified: Secondary | ICD-10-CM | POA: Diagnosis not present

## 2017-07-11 DIAGNOSIS — G9340 Encephalopathy, unspecified: Secondary | ICD-10-CM | POA: Diagnosis not present

## 2017-07-11 DIAGNOSIS — I10 Essential (primary) hypertension: Secondary | ICD-10-CM | POA: Diagnosis not present

## 2017-07-12 DIAGNOSIS — G309 Alzheimer's disease, unspecified: Secondary | ICD-10-CM | POA: Diagnosis not present

## 2017-07-12 DIAGNOSIS — I10 Essential (primary) hypertension: Secondary | ICD-10-CM | POA: Diagnosis not present

## 2017-07-12 DIAGNOSIS — R634 Abnormal weight loss: Secondary | ICD-10-CM | POA: Diagnosis not present

## 2017-07-12 DIAGNOSIS — E46 Unspecified protein-calorie malnutrition: Secondary | ICD-10-CM | POA: Diagnosis not present

## 2017-07-12 DIAGNOSIS — R1310 Dysphagia, unspecified: Secondary | ICD-10-CM | POA: Diagnosis not present

## 2017-07-12 DIAGNOSIS — G9340 Encephalopathy, unspecified: Secondary | ICD-10-CM | POA: Diagnosis not present

## 2017-07-17 DIAGNOSIS — E46 Unspecified protein-calorie malnutrition: Secondary | ICD-10-CM | POA: Diagnosis not present

## 2017-07-17 DIAGNOSIS — I10 Essential (primary) hypertension: Secondary | ICD-10-CM | POA: Diagnosis not present

## 2017-07-17 DIAGNOSIS — G309 Alzheimer's disease, unspecified: Secondary | ICD-10-CM | POA: Diagnosis not present

## 2017-07-17 DIAGNOSIS — R634 Abnormal weight loss: Secondary | ICD-10-CM | POA: Diagnosis not present

## 2017-07-17 DIAGNOSIS — R1310 Dysphagia, unspecified: Secondary | ICD-10-CM | POA: Diagnosis not present

## 2017-07-17 DIAGNOSIS — G9340 Encephalopathy, unspecified: Secondary | ICD-10-CM | POA: Diagnosis not present

## 2017-07-18 DIAGNOSIS — G9340 Encephalopathy, unspecified: Secondary | ICD-10-CM | POA: Diagnosis not present

## 2017-07-18 DIAGNOSIS — R634 Abnormal weight loss: Secondary | ICD-10-CM | POA: Diagnosis not present

## 2017-07-18 DIAGNOSIS — R1310 Dysphagia, unspecified: Secondary | ICD-10-CM | POA: Diagnosis not present

## 2017-07-18 DIAGNOSIS — G309 Alzheimer's disease, unspecified: Secondary | ICD-10-CM | POA: Diagnosis not present

## 2017-07-18 DIAGNOSIS — I10 Essential (primary) hypertension: Secondary | ICD-10-CM | POA: Diagnosis not present

## 2017-07-18 DIAGNOSIS — E46 Unspecified protein-calorie malnutrition: Secondary | ICD-10-CM | POA: Diagnosis not present

## 2017-07-19 DIAGNOSIS — E46 Unspecified protein-calorie malnutrition: Secondary | ICD-10-CM | POA: Diagnosis not present

## 2017-07-19 DIAGNOSIS — R1310 Dysphagia, unspecified: Secondary | ICD-10-CM | POA: Diagnosis not present

## 2017-07-19 DIAGNOSIS — R634 Abnormal weight loss: Secondary | ICD-10-CM | POA: Diagnosis not present

## 2017-07-19 DIAGNOSIS — I10 Essential (primary) hypertension: Secondary | ICD-10-CM | POA: Diagnosis not present

## 2017-07-19 DIAGNOSIS — G9340 Encephalopathy, unspecified: Secondary | ICD-10-CM | POA: Diagnosis not present

## 2017-07-19 DIAGNOSIS — G309 Alzheimer's disease, unspecified: Secondary | ICD-10-CM | POA: Diagnosis not present

## 2017-07-21 ENCOUNTER — Ambulatory Visit: Admitting: Neurology

## 2017-07-21 DIAGNOSIS — I10 Essential (primary) hypertension: Secondary | ICD-10-CM | POA: Diagnosis not present

## 2017-07-21 DIAGNOSIS — E46 Unspecified protein-calorie malnutrition: Secondary | ICD-10-CM | POA: Diagnosis not present

## 2017-07-21 DIAGNOSIS — R634 Abnormal weight loss: Secondary | ICD-10-CM | POA: Diagnosis not present

## 2017-07-21 DIAGNOSIS — G309 Alzheimer's disease, unspecified: Secondary | ICD-10-CM | POA: Diagnosis not present

## 2017-07-21 DIAGNOSIS — G9340 Encephalopathy, unspecified: Secondary | ICD-10-CM | POA: Diagnosis not present

## 2017-07-21 DIAGNOSIS — R1310 Dysphagia, unspecified: Secondary | ICD-10-CM | POA: Diagnosis not present

## 2017-07-22 ENCOUNTER — Telehealth: Payer: Self-pay | Admitting: *Deleted

## 2017-07-22 NOTE — Telephone Encounter (Signed)
Scheduled the patient f/u appt (per Lucia GaskinsAhern) for 09/22/17 @ 3:00 pm. Called the patient & wife to confirm. When they call back, please confirm that they are ok with that appt.

## 2017-07-23 DIAGNOSIS — I10 Essential (primary) hypertension: Secondary | ICD-10-CM | POA: Diagnosis not present

## 2017-07-23 DIAGNOSIS — R1310 Dysphagia, unspecified: Secondary | ICD-10-CM | POA: Diagnosis not present

## 2017-07-23 DIAGNOSIS — G309 Alzheimer's disease, unspecified: Secondary | ICD-10-CM | POA: Diagnosis not present

## 2017-07-23 DIAGNOSIS — G9340 Encephalopathy, unspecified: Secondary | ICD-10-CM | POA: Diagnosis not present

## 2017-07-23 DIAGNOSIS — E46 Unspecified protein-calorie malnutrition: Secondary | ICD-10-CM | POA: Diagnosis not present

## 2017-07-23 DIAGNOSIS — R634 Abnormal weight loss: Secondary | ICD-10-CM | POA: Diagnosis not present

## 2017-07-23 NOTE — Telephone Encounter (Signed)
Called patient's home again and LVM asking for call back to confirm the patient's appt.

## 2017-07-24 DIAGNOSIS — R634 Abnormal weight loss: Secondary | ICD-10-CM | POA: Diagnosis not present

## 2017-07-24 DIAGNOSIS — G9340 Encephalopathy, unspecified: Secondary | ICD-10-CM | POA: Diagnosis not present

## 2017-07-24 DIAGNOSIS — R1310 Dysphagia, unspecified: Secondary | ICD-10-CM | POA: Diagnosis not present

## 2017-07-24 DIAGNOSIS — E46 Unspecified protein-calorie malnutrition: Secondary | ICD-10-CM | POA: Diagnosis not present

## 2017-07-24 DIAGNOSIS — G309 Alzheimer's disease, unspecified: Secondary | ICD-10-CM | POA: Diagnosis not present

## 2017-07-24 DIAGNOSIS — I10 Essential (primary) hypertension: Secondary | ICD-10-CM | POA: Diagnosis not present

## 2017-07-24 NOTE — Telephone Encounter (Signed)
Patient's wife called and confirmed appointment with Dr. Lucia GaskinsAhern on  09-22-17 at 3pm.

## 2017-07-26 DIAGNOSIS — R634 Abnormal weight loss: Secondary | ICD-10-CM | POA: Diagnosis not present

## 2017-07-26 DIAGNOSIS — R1310 Dysphagia, unspecified: Secondary | ICD-10-CM | POA: Diagnosis not present

## 2017-07-26 DIAGNOSIS — I10 Essential (primary) hypertension: Secondary | ICD-10-CM | POA: Diagnosis not present

## 2017-07-26 DIAGNOSIS — G9340 Encephalopathy, unspecified: Secondary | ICD-10-CM | POA: Diagnosis not present

## 2017-07-26 DIAGNOSIS — G309 Alzheimer's disease, unspecified: Secondary | ICD-10-CM | POA: Diagnosis not present

## 2017-07-26 DIAGNOSIS — E46 Unspecified protein-calorie malnutrition: Secondary | ICD-10-CM | POA: Diagnosis not present

## 2017-07-31 DIAGNOSIS — G9340 Encephalopathy, unspecified: Secondary | ICD-10-CM | POA: Diagnosis not present

## 2017-07-31 DIAGNOSIS — I10 Essential (primary) hypertension: Secondary | ICD-10-CM | POA: Diagnosis not present

## 2017-07-31 DIAGNOSIS — R1310 Dysphagia, unspecified: Secondary | ICD-10-CM | POA: Diagnosis not present

## 2017-07-31 DIAGNOSIS — E46 Unspecified protein-calorie malnutrition: Secondary | ICD-10-CM | POA: Diagnosis not present

## 2017-07-31 DIAGNOSIS — G309 Alzheimer's disease, unspecified: Secondary | ICD-10-CM | POA: Diagnosis not present

## 2017-07-31 DIAGNOSIS — R634 Abnormal weight loss: Secondary | ICD-10-CM | POA: Diagnosis not present

## 2017-08-05 DIAGNOSIS — R634 Abnormal weight loss: Secondary | ICD-10-CM | POA: Diagnosis not present

## 2017-08-05 DIAGNOSIS — N401 Enlarged prostate with lower urinary tract symptoms: Secondary | ICD-10-CM | POA: Diagnosis not present

## 2017-08-05 DIAGNOSIS — I10 Essential (primary) hypertension: Secondary | ICD-10-CM | POA: Diagnosis not present

## 2017-08-05 DIAGNOSIS — E46 Unspecified protein-calorie malnutrition: Secondary | ICD-10-CM | POA: Diagnosis not present

## 2017-08-05 DIAGNOSIS — G9340 Encephalopathy, unspecified: Secondary | ICD-10-CM | POA: Diagnosis not present

## 2017-08-05 DIAGNOSIS — G309 Alzheimer's disease, unspecified: Secondary | ICD-10-CM | POA: Diagnosis not present

## 2017-08-05 DIAGNOSIS — R1310 Dysphagia, unspecified: Secondary | ICD-10-CM | POA: Diagnosis not present

## 2017-08-07 DIAGNOSIS — I10 Essential (primary) hypertension: Secondary | ICD-10-CM | POA: Diagnosis not present

## 2017-08-07 DIAGNOSIS — G9340 Encephalopathy, unspecified: Secondary | ICD-10-CM | POA: Diagnosis not present

## 2017-08-07 DIAGNOSIS — E46 Unspecified protein-calorie malnutrition: Secondary | ICD-10-CM | POA: Diagnosis not present

## 2017-08-07 DIAGNOSIS — R1310 Dysphagia, unspecified: Secondary | ICD-10-CM | POA: Diagnosis not present

## 2017-08-07 DIAGNOSIS — G309 Alzheimer's disease, unspecified: Secondary | ICD-10-CM | POA: Diagnosis not present

## 2017-08-07 DIAGNOSIS — R634 Abnormal weight loss: Secondary | ICD-10-CM | POA: Diagnosis not present

## 2017-08-14 ENCOUNTER — Telehealth: Payer: Self-pay | Admitting: *Deleted

## 2017-08-14 NOTE — Telephone Encounter (Signed)
Received call from patient's wife stating [facility] called her to pick up refill of Divalproex Sod ER. The wife stated the patient is no longer on this medication. This RN confirmed, stated she will call [facility] to inform them. Wife verbalized appreciation. [facility] stated that [facility] not [facility] since May 2018, and the Rx is inactive. She stated they have Mirapex ready for pick up. She stated the wife misunderstood. She verbalized understanding of call.

## 2017-09-22 ENCOUNTER — Encounter (INDEPENDENT_AMBULATORY_CARE_PROVIDER_SITE_OTHER): Payer: Self-pay

## 2017-09-22 ENCOUNTER — Ambulatory Visit (INDEPENDENT_AMBULATORY_CARE_PROVIDER_SITE_OTHER): Payer: Medicare Other | Admitting: Neurology

## 2017-09-22 ENCOUNTER — Encounter: Payer: Self-pay | Admitting: Neurology

## 2017-09-22 VITALS — BP 119/73 | HR 62 | Ht 71.0 in | Wt 163.0 lb

## 2017-09-22 DIAGNOSIS — F0391 Unspecified dementia with behavioral disturbance: Secondary | ICD-10-CM | POA: Diagnosis not present

## 2017-09-22 MED ORDER — CLONAZEPAM 0.5 MG PO TABS
ORAL_TABLET | ORAL | 5 refills | Status: DC
Start: 2017-09-22 — End: 2018-10-13

## 2017-09-22 MED ORDER — SERTRALINE HCL 25 MG PO TABS: 25.0000 mg | ORAL_TABLET | Freq: Every day | ORAL | 11 refills | Status: DC

## 2017-09-22 NOTE — Progress Notes (Signed)
[id number]    Provider:  Dr Lucia GaskinsAhern Referring Provider: Pearson GrippeKim, [name], MD Primary Care Physician:  Pearson GrippeKim, [name], MD   Interval history 09/22/2017: Very combative in the afternoons. No hallucinations. He is doing well during the day. He has very few good days. He goes every day. He goes 5 days a week 730-415pm. He eats supper and afterwards she tries to get him changed. He will swing at her. On the weekends, he will have breakfast and he will go back to bed until 5pm and then he goes back to bed. There is considerable agitation and patient is aggressive and physical. There is also caregiver burnout. Discussed this with wife and ways to decrease stress. Recommend a memory unit ultimately, wife will have difficulty affording.  Interval history 03/18/2016: More agitated, sleep walking. He naps during the day then He is very sleepy during the day, he will swing at her if she tries to stop him from sleeping. He is going to an adult daycare. Not aside effect of the medication. He is acting out at the center as well. Lots of agitation. Depakote has been helping with the agitation. We can increase the medication if needed. He tries to get up, he gets up every night, wife redirects him. Some nights he sleeps well. Other nights he does not. He goes to be at 6-630 and I suggest he goes to sleep later instead. No significant snoring at night, not stopping breathing. Memory worsening. Better when he is at the adult center in the day.   Interval history 12/14/2015: Wife found him, he was stiff. Eye closed. Arms folded over his chest, couldn;t pull them apart, fingers intertwined and folded on his chest. He was still stiff on the stretcher. Not talking. Eyes closed. No urination or defecation. Marland Kitchen. He was confused all day Sunday. EEG showed nonspecific slowing. The rigidity lasted 5-10 minutes. He did not urinate or defecate. He was confused the rest of the day in the ED, he was not back to baseline. She  thought he was dead. His lactate was elevated without other significant changes in BMP. Even EMS had a very difficult time putting him on the stricture due to his stiffness. CT of the head showed no acute processes or events.  HISTORY OF PRESENT ILLNESS: [name] is a 77 year old male with a history of dementia most likely Alzheimer's disease. He was last seen by Dr. Hosie PoissonSumner. He returns today for follow-up. The patient is currently taking Aricept 23 mg daily as well as Namenda XR 28 mg. The patient reports that his memory has gotten better. His wife reports that his memory has gotten worse. The patient continues to complete ADLs independently. His wife states that occasionally he will not put on clean clothes. She states that he tends to lay his clothes out for him. The patient no longer operates a motor vehicle. The wife states the patient had to give up his job due to his memory. Wife now completes all the finances. Patient was having some hallucinations. His wife states that occasionally during the night he will talk in his sleep but denies the patient may imbalance. She states occasionally if he gets up to go the bathroom during the night he may get confused. She is normally able to tell him to go back to sleep and the patient calms down. She reports that the patient continues to sleep a lot during the day. He is also seen by a urologist for BPH. She states currently they  are checking the nerves in his bladder every week for 12 weeks. Other than this no new medical issues since last seen.  Interval history 10/16/2015: Dr. Lucia Gaskins. I am seeing patient today for the first time. Things are going well per patient. But not per wife. He is still on Aricept. Memory continues to decline. Lots of confusion expressibe himself. Started around 2010 and has been progressively slowly worsening. 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 Wife had noticed changes at  home even before that. Trouble with short term memory, trouble remembering conversations. He is not driving. Wife provides all information. He goes to a daycare center 5 days a week. Wife works 4 hours a day. They keep him very active and he is very happy there. He is wandering at night. She is able to redirect him into bed. During the day he will get defiant and not want to put on the depends. Right now she feels like she is ok. No delusions or hallucinations. [facility] [address].   HISTORY 01/26/14 (SUMNER): [name] is a 77 y.o. male here as a follow up from Dr. Shana Chute for cognitive decline with prior visit on 10/2013. At that visit his Aricept was increased to 23mg . Minimal to no improvement noted with this. Continues to have visual and auditory hallucinations but wife notes this has decreased slightly since last visit. Wife notes that he can be easily redirected when he is having hallucinations. She notes that he sleeps a lot during the day, will watch a lot of TV. Notes he is not as active as he used to be. He is not currently driving. No difficulty eating, no choking. Wife notes he has been losing some weight, appetite is decreased. Has strong family history of memory loss.   Initial visit 05/2013: Started noticing around 4 years ago, has been getting progressively worse, increased decline in the past yar. Trouble with short term memory, trouble remembering conversations. Wife manages the finances, this changed 3 years ago. Continues driving, short distances. Wife has some concerns about his driving.No episodes of getting lost. Has good remote memory. No history of strokes or TIAs. Has sister with dementia. No tobacco or EtOH. 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 Has some nighttime agitation. No hallucinations.   Aricept and Namenda started around 3 to 4 years ago. Wife has not noticed much benefit.    Has had multiple syncopal episodes, following with Dr Shana Chute, adjusting his blood pressure medication. Gets very diaphoretic during these episodes. No extremity shaking.    Review of Systems: Patient complains of symptoms per HPI as well as the following symptoms: Frequency of urination, speech difficulty, passing out, confusion, frequent waking, snoring, acting out dreams. Pertinent negatives per HPI. All others negative.   Social History   Socioeconomic History  . Marital status: Married    Spouse name: mary  . Number of children: 2  . Years of education: college  . Highest education level: Not on file  Social Needs  . Financial resource strain: Not on file  . Food insecurity - worry: Not on file  . Food insecurity - inability: Not on file  . Transportation needs - medical: Not on file  . Transportation needs - non-medical: Not on file  Occupational History  . Occupation: retired  Tobacco Use  . Smoking status: Never Smoker  .  Smokeless tobacco: Never Used  Substance and Sexual Activity  . Alcohol use: No    Alcohol/week: 0.0 oz  . Drug use: No  . Sexual activity: Not on file  Other Topics Concern  . Not on file  Social History Narrative   Patient is married [name]), has 2 children   Patient is right handed   Education level is college   Drinks about 1 cup of coffee daily     Family History  Problem Relation Age of Onset  . Hypertension Mother   . Dementia Sister     Past Medical History:  Diagnosis Date  . Dementia   . Hypertension     Past Surgical History:  Procedure Laterality Date  . NO PAST SURGERIES      Current Outpatient Medications  Medication Sig Dispense Refill  . amLODipine (NORVASC) 10 MG tablet Take 10 mg by mouth daily.     Marland Kitchen donepezil (ARICEPT) 23 MG TABS tablet TAKE 1 TABLET BY MOUTH AT BEDTIME 30 tablet 11  . losartan (COZAAR) 100 MG tablet Take 100 mg daily by mouth.    . Valproate Sodium (VALPROIC ACID) 250 MG/5ML SOLN Take  10 mLs 2 (two) times daily by mouth.    . clonazePAM (KLONOPIN) 0.5 MG tablet Give 1-2 tablets 3-4x a day as needed for agitation. 90 tablet 5  . [facility] SENNA PLUS 8.6-50 MG tablet Take 2 tablets by mouth daily as needed for constipation.  3  . sertraline (ZOLOFT) 25 MG tablet Take 1 tablet (25 mg total) by mouth daily. 30 tablet 11   No current facility-administered medications for this visit.     Allergies as of 09/22/2017  . (No Known Allergies)    Vitals: BP 119/73 (BP Location: Left Arm, Patient Position: Sitting)   Pulse 62   Ht 5\' 11"  (1.803 m)   Wt 163 lb (73.9 kg)   BMI 22.73 kg/m  Last Weight:  Wt Readings from Last 1 Encounters:  09/22/17 163 lb (73.9 kg)   Last Height:   Ht Readings from Last 1 Encounters:  09/22/17 5\' 11"  (1.803 m)    MMSE - Mini Mental State Exam 09/22/2017 09/18/2016 04/18/2015  Not completed: Unable to complete - -  Orientation to time - 0 0  Orientation to Place - 0 3  Registration - 3 3  Attention/ Calculation - 0 0  Recall - 0 0  Language- name 2 objects - 1 2  Language- repeat - 0 1  Language- follow 3 step command - 1 3  Language- read & follow direction - 1 1  Write a sentence - 0 0  Copy design - 0 1  Total score - 6 14     Cranial nerve II-XII: Pupils were equal round reactive to light. Extraocular movements were full, visual field were full on confrontational test. Facial sensation and strength were normal. Uvula tongue midline. Head turning and shoulder shrug were normal and symmetric. Motor: The motor testing reveals 5 over 5 strength of all 4 extremities. Good symmetric motor tone is noted throughout.  Sensory: Sensory testing is intact to soft touch on all 4 extremities. No evidence of extinction is noted.  Coordination: Cerebellar testing reveals good finger-nose-finger and heel-to-shin bilaterally.  Gait and station: Gait is normal. Tandem gait is normal. Romberg is negative. No drift is seen.  Reflexes: Deep tendon  reflexes are symmetric and normal bilaterally.    Assessment/Plan: 77 y.o. year old male has a past medical history of  Dementia and Hypertension. Neurologic exam is nonfocal. Here with his wife who is his caretaker. He was admitted to the hospital in the past for an episode of severe stiffness, followed by many hours of confusion, elevated lactate in the emergency without significant BMP changes to indicate dehydration or other cause, no UTI: EEG showed nonspecific slowing possibly postictal changes are due to his dementia. Could not rule out the possibility of seizures.   1. 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 We'll  continue Depakote outpatient, this may also help with his behavioral problems due to his dementia as well. 3. Discussed caretaker burnout with wife 4. Continue Aricept. They decline Namenda. 5. Continue depakote for possible seizure event which may help with mood and behavior as well. No more seizure like events. Wife declines increasing at this time. 6. For agitation will start Zoloft and can increase up to 100mg  as needed and tolerated 7, Start clonazepam as needed for severe agitation if cannot be redirected. Discussed risks especially in the elderly including falls and sedation.  8. Can consider Risperdal or Seroquel, discussed risks and that these meds are not FDA approved in the elderly but are often used for behavioral problems.   Naomie Dean, MD  [facility] [address] [place], [place] [address]  Phone [phone] Fax [phone]  A total of 25 minutes was spent face-to-face with this patient. Over half this time was spent on counseling patient on the dementia with behavioral problems diagnosis and different therapeutic options available.

## 2017-09-22 NOTE — Patient Instructions (Addendum)
Try Clonazepam for agitation (Benzodiazepines)  Another option for behavioral problems in dementia is Seroquel (atypical antipsychotics)  Start anti-depressant, often can help with mood and agitation. Start Zoloft 25mg  daily and can slowly increase to 100mg .     Clonazepam tablets What is this medicine? CLONAZEPAM (kloe NA ze pam) is a benzodiazepine. It is used to treat certain types of seizures. It is also used to treat panic disorder. This medicine may be used for other purposes; ask your health care provider or pharmacist if you have questions. COMMON BRAND NAME(S): Ceberclon, Klonopin What should I tell my health care provider before I take this medicine? They need to know if you have any of these conditions: -an alcohol or drug abuse problem -bipolar disorder, depression, psychosis or other mental health condition -glaucoma -kidney or liver disease -lung or breathing disease -myasthenia gravis -Parkinson's disease -porphyria -seizures or a history of seizures -suicidal thoughts -an unusual or allergic reaction to clonazepam, other benzodiazepines, foods, dyes, or preservatives -pregnant or trying to get pregnant -breast-feeding How should I use this medicine? Take this medicine by mouth with a glass of water. Follow the directions on the prescription label. If it upsets your stomach, take it with food or milk. Take your medicine at regular intervals. Do not take it more often than directed. Do not stop taking or change the dose except on the advice of your doctor or health care professional. A special MedGuide will be given to you by the pharmacist with each prescription and refill. Be sure to read this information carefully each time. Talk to your pediatrician regarding the use of this medicine in children. Special care may be needed. Overdosage: If you think you have taken too much of this medicine contact a poison control center or emergency room at once. NOTE: This medicine  is only for you. Do not share this medicine with others. What if I miss a dose? If you miss a dose, take it as soon as you can. If it is almost time for your next dose, take only that dose. Do not take double or extra doses. What may interact with this medicine? Do not take this medication with any of the following medicines: -narcotic medicines for cough -sodium oxybate This medicine may also interact with the following medications: -alcohol -antihistamines for allergy, cough and cold -antiviral medicines for HIV or AIDS -certain medicines for anxiety or sleep -certain medicines for depression, like amitriptyline, fluoxetine, sertraline -certain medicines for fungal infections like ketoconazole and itraconazole -certain medicines for seizures like carbamazepine, phenobarbital, phenytoin, primidone -general anesthetics like halothane, isoflurane, methoxyflurane, propofol -local anesthetics like lidocaine, pramoxine, tetracaine -medicines that relax muscles for surgery -narcotic medicines for pain -phenothiazines like chlorpromazine, mesoridazine, prochlorperazine, thioridazine This list may not describe all possible interactions. Give your health care provider a list of all the medicines, herbs, non-prescription drugs, or dietary supplements you use. Also tell them if you smoke, drink alcohol, or use illegal drugs. Some items may interact with your medicine. What should I watch for while using this medicine? Tell your doctor or health care professional if your symptoms do not start to get better or if they get worse. Do not stop taking except on your doctor's advice. You may develop a severe reaction. Your doctor will tell you how much medicine to take. You may get drowsy or dizzy. Do not drive, use machinery, or do anything that needs mental alertness until you know how this medicine affects you. To reduce the risk of dizzy  and fainting spells, do not stand or sit up quickly, especially if  you are an older patient. Alcohol may increase dizziness and drowsiness. Avoid alcoholic drinks. If you are taking another medicine that also causes drowsiness, you may have more side effects. Give your health care provider a list of all medicines you use. Your doctor will tell you how much medicine to take. Do not take more medicine than directed. Call emergency for help if you have problems breathing or unusual sleepiness. The use of this medicine may increase the chance of suicidal thoughts or actions. Pay special attention to how you are responding while on this medicine. Any worsening of mood, or thoughts of suicide or dying should be reported to your health care professional right away. What side effects may I notice from receiving this medicine? Side effects that you should report to your doctor or health care professional as soon as possible: -allergic reactions like skin rash, itching or hives, swelling of the face, lips, or tongue -breathing problems -confusion -loss of balance or coordination -signs and symptoms of low blood pressure like dizziness; feeling faint or lightheaded, falls; unusually weak or tired -suicidal thoughts or mood changes Side effects that usually do not require medical attention (report to your doctor or health care professional if they continue or are bothersome): -dizziness -headache -tiredness -upset stomach This list may not describe all possible side effects. Call your doctor for medical advice about side effects. You may report side effects to FDA at 1-800-FDA-1088. Where should I keep my medicine? Keep out of the reach of children. This medicine can be abused. Keep your medicine in a safe place to protect it from theft. Do not share this medicine with anyone. Selling or giving away this medicine is dangerous and against the law. This medicine may cause accidental overdose and death if taken by other adults, children, or pets. Mix any unused medicine with a  substance like cat litter or coffee grounds. Then throw the medicine away in a sealed container like a sealed bag or a coffee can with a lid. Do not use the medicine after the expiration date. Store at room temperature between 15 and 30 degrees C (59 and 86 degrees F). Protect from light. Keep container tightly closed. NOTE: This sheet is a summary. It may not cover all possible information. If you have questions about this medicine, talk to your doctor, pharmacist, or health care provider.  2018 Elsevier/Gold Standard (2015-12-29 18:46:32)  Sertraline tablets What is this medicine? SERTRALINE (SER tra leen) is used to treat depression. It may also be used to treat obsessive compulsive disorder, panic disorder, post-trauma stress, premenstrual dysphoric disorder (PMDD) or social anxiety. This medicine may be used for other purposes; ask your health care provider or pharmacist if you have questions. COMMON BRAND NAME(S): Zoloft What should I tell my health care provider before I take this medicine? They need to know if you have any of these conditions: -bleeding disorders -bipolar disorder or a family history of bipolar disorder -glaucoma -heart disease -high blood pressure -history of irregular heartbeat -history of low levels of calcium, magnesium, or potassium in the blood -if you often drink alcohol -liver disease -receiving electroconvulsive therapy -seizures -suicidal thoughts, plans, or attempt; a previous suicide attempt by you or a family member -take medicines that treat or prevent blood clots -thyroid disease -an unusual or allergic reaction to sertraline, other medicines, foods, dyes, or preservatives -pregnant or trying to get pregnant -breast-feeding How should I use  this medicine? Take this medicine by mouth with a glass of water. Follow the directions on the prescription label. You can take it with or without food. Take your medicine at regular intervals. Do not take  your medicine more often than directed. Do not stop taking this medicine suddenly except upon the advice of your doctor. Stopping this medicine too quickly may cause serious side effects or your condition may worsen. A special MedGuide will be given to you by the pharmacist with each prescription and refill. Be sure to read this information carefully each time. Talk to your pediatrician regarding the use of this medicine in children. While this drug may be prescribed for children as young as 7 years for selected conditions, precautions do apply. Overdosage: If you think you have taken too much of this medicine contact a poison control center or emergency room at once. NOTE: This medicine is only for you. Do not share this medicine with others. What if I miss a dose? If you miss a dose, take it as soon as you can. If it is almost time for your next dose, take only that dose. Do not take double or extra doses. What may interact with this medicine? Do not take this medicine with any of the following medications: -cisapride -dofetilide -dronedarone -linezolid -MAOIs like Carbex, Eldepryl, Marplan, Nardil, and Parnate -methylene blue (injected into a vein) -pimozide -thioridazine This medicine may also interact with the following medications: -alcohol -amphetamines -aspirin and aspirin-like medicines -certain medicines for depression, anxiety, or psychotic disturbances -certain medicines for fungal infections like ketoconazole, fluconazole, posaconazole, and itraconazole -certain medicines for irregular heart beat like flecainide, quinidine, propafenone -certain medicines for migraine headaches like almotriptan, eletriptan, frovatriptan, naratriptan, rizatriptan, sumatriptan, zolmitriptan -certain medicines for sleep -certain medicines for seizures like carbamazepine, valproic acid, phenytoin -certain medicines that treat or prevent blood clots like warfarin, enoxaparin,  dalteparin -cimetidine -digoxin -diuretics -fentanyl -isoniazid -lithium -NSAIDs, medicines for pain and inflammation, like ibuprofen or naproxen -other medicines that prolong the QT interval (cause an abnormal heart rhythm) -rasagiline -safinamide -supplements like St. John's wort, kava kava, valerian -tolbutamide -tramadol -tryptophan This list may not describe all possible interactions. Give your health care provider a list of all the medicines, herbs, non-prescription drugs, or dietary supplements you use. Also tell them if you smoke, drink alcohol, or use illegal drugs. Some items may interact with your medicine. What should I watch for while using this medicine? Tell your doctor if your symptoms do not get better or if they get worse. Visit your doctor or health care professional for regular checks on your progress. Because it may take several weeks to see the full effects of this medicine, it is important to continue your treatment as prescribed by your doctor. Patients and their families should watch out for new or worsening thoughts of suicide or depression. Also watch out for sudden changes in feelings such as feeling anxious, agitated, panicky, irritable, hostile, aggressive, impulsive, severely restless, overly excited and hyperactive, or not being able to sleep. If this happens, especially at the beginning of treatment or after a change in dose, call your health care professional. Bonita Quin may get drowsy or dizzy. Do not drive, use machinery, or do anything that needs mental alertness until you know how this medicine affects you. Do not stand or sit up quickly, especially if you are an older patient. This reduces the risk of dizzy or fainting spells. Alcohol may interfere with the effect of this medicine. Avoid alcoholic drinks. Your  mouth may get dry. Chewing sugarless gum or sucking hard candy, and drinking plenty of water may help. Contact your doctor if the problem does not go away or  is severe. What side effects may I notice from receiving this medicine? Side effects that you should report to your doctor or health care professional as soon as possible: -allergic reactions like skin rash, itching or hives, swelling of the face, lips, or tongue -anxious -black, tarry stools -changes in vision -confusion -elevated mood, decreased need for sleep, racing thoughts, impulsive behavior -eye pain -fast, irregular heartbeat -feeling faint or lightheaded, falls -feeling agitated, angry, or irritable -hallucination, loss of contact with reality -loss of balance or coordination -loss of memory -painful or prolonged erections -restlessness, pacing, inability to keep still -seizures -stiff muscles -suicidal thoughts or other mood changes -trouble sleeping -unusual bleeding or bruising -unusually weak or tired -vomiting Side effects that usually do not require medical attention (report to your doctor or health care professional if they continue or are bothersome): -change in appetite or weight -change in sex drive or performance -diarrhea -increased sweating -indigestion, nausea -tremors This list may not describe all possible side effects. Call your doctor for medical advice about side effects. You may report side effects to FDA at 1-800-FDA-1088. Where should I keep my medicine? Keep out of the reach of children. Store at room temperature between 15 and 30 degrees C (59 and 86 degrees F). Throw away any unused medicine after the expiration date. NOTE: This sheet is a summary. It may not cover all possible information. If you have questions about this medicine, talk to your doctor, pharmacist, or health care provider.  2018 Elsevier/Gold Standard (2016-07-26 14:17:49)

## 2018-03-09 DIAGNOSIS — I10 Essential (primary) hypertension: Secondary | ICD-10-CM | POA: Diagnosis not present

## 2018-04-02 DIAGNOSIS — G1229 Other motor neuron disease: Secondary | ICD-10-CM | POA: Diagnosis not present

## 2018-04-02 DIAGNOSIS — I10 Essential (primary) hypertension: Secondary | ICD-10-CM | POA: Diagnosis not present

## 2018-04-02 DIAGNOSIS — Z0000 Encounter for general adult medical examination without abnormal findings: Secondary | ICD-10-CM | POA: Diagnosis not present

## 2018-04-02 DIAGNOSIS — F0391 Unspecified dementia with behavioral disturbance: Secondary | ICD-10-CM | POA: Diagnosis not present

## 2018-04-02 DIAGNOSIS — R972 Elevated prostate specific antigen [PSA]: Secondary | ICD-10-CM | POA: Diagnosis not present

## 2018-05-13 DIAGNOSIS — Z23 Encounter for immunization: Secondary | ICD-10-CM | POA: Diagnosis not present

## 2018-05-13 DIAGNOSIS — J019 Acute sinusitis, unspecified: Secondary | ICD-10-CM | POA: Diagnosis not present

## 2018-09-11 ENCOUNTER — Telehealth: Payer: Self-pay | Admitting: Neurology

## 2018-09-11 NOTE — Telephone Encounter (Signed)
error 

## 2018-09-18 ENCOUNTER — Emergency Department (HOSPITAL_COMMUNITY)
Admission: EM | Admit: 2018-09-18 | Discharge: 2018-09-19 | Disposition: A | Discharge status: Home / Self Care | Payer: Medicare Other | Attending: Emergency Medicine | Admitting: Emergency Medicine

## 2018-09-18 ENCOUNTER — Encounter (HOSPITAL_COMMUNITY): Payer: Self-pay | Admitting: Emergency Medicine

## 2018-09-18 DIAGNOSIS — F0390 Unspecified dementia without behavioral disturbance: Secondary | ICD-10-CM | POA: Diagnosis not present

## 2018-09-18 DIAGNOSIS — R630 Anorexia: Secondary | ICD-10-CM | POA: Insufficient documentation

## 2018-09-18 DIAGNOSIS — R319 Hematuria, unspecified: Secondary | ICD-10-CM | POA: Insufficient documentation

## 2018-09-18 DIAGNOSIS — Z79899 Other long term (current) drug therapy: Secondary | ICD-10-CM | POA: Insufficient documentation

## 2018-09-18 DIAGNOSIS — G309 Alzheimer's disease, unspecified: Secondary | ICD-10-CM | POA: Insufficient documentation

## 2018-09-18 DIAGNOSIS — R531 Weakness: Secondary | ICD-10-CM | POA: Insufficient documentation

## 2018-09-18 DIAGNOSIS — F0280 Dementia in other diseases classified elsewhere without behavioral disturbance: Secondary | ICD-10-CM | POA: Insufficient documentation

## 2018-09-18 DIAGNOSIS — I10 Essential (primary) hypertension: Secondary | ICD-10-CM | POA: Insufficient documentation

## 2018-09-18 LAB — BASIC METABOLIC PANEL
ANION GAP: 15 (ref 5–15)
BUN: 24 mg/dL — ABNORMAL HIGH (ref 8–23)
CALCIUM: 8.7 mg/dL — AB (ref 8.9–10.3)
CO2: 18 mmol/L — AB (ref 22–32)
CREATININE: 1.69 mg/dL — AB (ref 0.61–1.24)
Chloride: 109 mmol/L (ref 98–111)
GFR, EST AFRICAN AMERICAN: 44 mL/min — AB (ref >60)
GFR, EST NON AFRICAN AMERICAN: 38 mL/min — AB (ref >60)
Glucose, Bld: 118 mg/dL — ABNORMAL HIGH (ref 70–99)
Potassium: 4.3 mmol/L (ref 3.5–5.1)
Sodium: 142 mmol/L (ref 135–145)

## 2018-09-18 LAB — CBC
HCT: 49.8 % (ref 39.0–52.0)
Hemoglobin: 14.7 g/dL (ref 13.0–17.0)
MCH: 27.1 pg (ref 26.0–34.0)
MCHC: 29.5 g/dL — ABNORMAL LOW (ref 30.0–36.0)
MCV: 91.7 fL (ref 80.0–100.0)
NRBC: 0 % (ref 0.0–0.2)
PLATELETS: 203 10*3/uL (ref 150–400)
RBC: 5.43 MIL/uL (ref 4.22–5.81)
RDW: 14 % (ref 11.5–15.5)
WBC: 8.1 10*3/uL (ref 4.0–10.5)

## 2018-09-18 MED ORDER — SODIUM CHLORIDE 0.9 % IV BOLUS (SEPSIS)
1000.0000 mL | Freq: Once | INTRAVENOUS | Status: AC
  Administered 2018-09-19: 1000 mL via INTRAVENOUS

## 2018-09-18 MED ORDER — SODIUM CHLORIDE 0.9% FLUSH: 3.0000 mL | Freq: Once | INTRAVENOUS | Status: DC

## 2018-09-18 NOTE — ED Provider Notes (Signed)
[facility]   CSN: [id number] Arrival date & time: 09/18/18  1636     History   Chief Complaint Chief Complaint  Patient presents with  . Weakness  . Urinary Tract Infection  Level 5 caveat due to dementia  HPI Donald [name] is a 78 y.o. male.  The history is provided by the spouse. The history is limited by the condition of the patient.  Weakness  Severity:  Moderate Onset quality:  Gradual Duration:  24 hours Timing:  Constant Progression:  Worsening Chronicity:  New Relieved by:  Nothing Worsened by:  Nothing Associated symptoms: foul-smelling urine   Associated symptoms: no diarrhea, no fever, no loss of consciousness and no vomiting   Urinary Tract Infection  Associated symptoms: no diarrhea, no fever and no vomiting   Patient with history of dementia and hypertension presents with generalized weakness and concern for UTI Patient stays in adult daycare during the day.  Staff had reported to wife that he had appeared to have generalized weakness and loss of appetite.  They report that his brief underwear had a foul odor of urine.  There is also hematuria noted.  No fevers or vomiting.  No recent falls.  No new medications  no diarrhea.  Patient does not take a lot of p.o. fluids, but does continue to have urine output At one point patient had pointed to lower abdomen as source of pain. Past Medical History:  Diagnosis Date  . Dementia (HCC)   . Hypertension     Patient Active Problem List   Diagnosis Date Noted  . Dementia with behavioral disturbance (HCC) 12/15/2015  . Tonic seizure (HCC) 12/15/2015  . Altered mental status 10/29/2015  . Elevated lactic acid level 10/29/2015  . Encephalopathy acute 10/29/2015  . Enlarged prostate 10/29/2015  . Hypertension 10/29/2015  . 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 NO PAST  SURGERIES          Home Medications    Prior to Admission medications   Medication Sig Start Date End Date Taking? Authorizing Provider  amLODipine (NORVASC) 10 MG tablet Take 10 mg by mouth daily.     [provider]  clonazePAM (KLONOPIN) 0.5 MG tablet Give 1-2 tablets 3-4x a day as needed for agitation. 09/22/17   Anson FretAhern, Antonia B, MD  [facility] SENNA PLUS 8.6-50 MG tablet Take 2 tablets by mouth daily as needed for constipation. 08/07/17   [provider]  donepezil (ARICEPT) 23 MG TABS tablet TAKE 1 TABLET BY MOUTH AT BEDTIME 11/18/16   Butch PennyMillikan, Megan, NP  losartan (COZAAR) 100 MG tablet Take 100 mg daily by mouth.    [provider]  sertraline (ZOLOFT) 25 MG tablet Take 1 tablet (25 mg total) by mouth daily. 09/22/17   Anson FretAhern, Antonia B, MD  Valproate Sodium (VALPROIC ACID) 250 MG/5ML SOLN Take 10 mLs 2 (two) times daily by mouth.    [provider]    Family History Family History  Problem Relation Age of Onset  . Hypertension Mother   . Dementia Sister     Social History Social History   Tobacco Use  . Smoking status: Never Smoker  . Smokeless tobacco: Never Used  Substance Use Topics  . Alcohol use: No    Alcohol/week: 0.0 standard drinks  . Drug use: No     Allergies   Patient has no known allergies.  Review of Systems Review of Systems  Unable to perform ROS: Dementia  Constitutional: Negative for fever.  Gastrointestinal: Negative for diarrhea and vomiting.  Neurological: Positive for weakness. Negative for loss of consciousness.     Physical Exam Updated Vital Signs BP 138/81   Pulse 76   Temp 98.3 F (36.8 C) (Oral)   Resp (!) 21   SpO2 97%   Physical Exam CONSTITUTIONAL: Elderly, no acute distress HEAD: Normocephalic/atraumatic EYES: EOMI/PERRL ENMT: Mucous membranes moist NECK: supple no meningeal signs SPINE/BACK:entire spine nontender CV: S1/S2 noted, no murmurs/rubs/gallops noted LUNGS: Lungs are clear  to auscultation bilaterally, no apparent distress ABDOMEN: soft, nontender, no rebound or guarding, bowel sounds noted throughout abdomen GU: Condom catheter in place NEURO: Pt is awake/alert, appears confused but follows commands.  Moves all extremities x4 EXTREMITIES: pulses normal/equal, full ROM SKIN: warm, color normal PSYCH: Unable to assess  ED Treatments / Results  Labs (all labs ordered are listed, but only abnormal results are displayed) Labs Reviewed  BASIC METABOLIC PANEL - Abnormal; Notable for the following components:      Result Value   CO2 18 (*)    Glucose, Bld 118 (*)    BUN 24 (*)    Creatinine, Ser 1.69 (*)    Calcium 8.7 (*)    GFR calc non Af Amer 38 (*)    GFR calc Af Amer 44 (*)    All other components within normal limits  CBC - Abnormal; Notable for the following components:   MCHC 29.5 (*)    All other components within normal limits  URINALYSIS, ROUTINE W REFLEX MICROSCOPIC - Abnormal; Notable for the following components:   Color, Urine AMBER (*)    APPearance HAZY (*)    Bilirubin Urine MODERATE (*)    Protein, ur 30 (*)    Bacteria, UA RARE (*)    All other components within normal limits  VALPROIC ACID LEVEL - Abnormal; Notable for the following components:   Valproic Acid Lvl 22 (*)    All other components within normal limits  CBG MONITORING, ED - Abnormal; Notable for the following components:   Glucose-Capillary 111 (*)    All other components within normal limits  AMMONIA    EKG EKG Interpretation  Date/Time:  Saturday September 19 2018 00:09:41 EST Ventricular Rate:  80 PR Interval:    QRS Duration: 90 QT Interval:  373 QTC Calculation: 431 R Axis:   19 Text Interpretation:  Sinus rhythm Confirmed by Zadie Rhine ([id number]) on 09/19/2018 12:12:48 AM   Radiology No results found.  Procedures Procedures (including critical care time)  Medications Ordered in ED Medications  sodium chloride flush (NS) 0.9 % injection 3 mL  (has no administration in time range)  sodium chloride 0.9 % bolus 1,000 mL (1,000 mLs Intravenous New Bag/Given 09/19/18 0020)     Initial Impression / Assessment and Plan / ED Course  I have reviewed the triage vital signs and the nursing notes.  Pertinent labs  results that were available during my care of the patient were reviewed by me and considered in my medical decision making (see chart for details).     11:30 PM Patient presents with increasing weakness concern for UTI.  He is in no acute distress and is resting comfortably. He appears mildly dehydrated. He has advanced dementia and will have some combativeness at home.  Wife feels comfortable managing him at home, and they are expecting nursing support starting next week. Labs are pending 1:22  AM The labs are reassuring except for mild dehydration. No signs of UTI.  Valproic acid level not elevated. Patient has been resting comfortably without any issue. I gave the wife further options of testing including CT head.  However no recent trauma has been reported. She feels comfortable managing patient at home and will bring him back if there is any acute change. There are no signs of trauma on exam, no added lung sounds to suggest pneumonia. No focal abdominal tenderness on my exam Nursing support is expected next week I suspect this is due to his worsening dementia Discussed need for outpatient follow-up.  Final Clinical Impressions(s) / ED Diagnoses   Final diagnoses:  Weakness    ED Discharge Orders    None       Zadie Rhine, MD 09/19/18 [phone]

## 2018-09-18 NOTE — ED Notes (Signed)
Pt at baseline for his dementia has had some hematuria reported today to his wife from daycare.

## 2018-09-18 NOTE — ED Triage Notes (Signed)
Patient to ED from home with wife for possible urinary tract infection. Patient has advanced dementia and stays at adult care center during the day. Staff there reported to patient's wife that patient has not quite been himself - exhibiting generalized weakness and loss of appetite. Wife also states that staff reported patient's brief had foul odor. When asked if patient is hurting anywhere, he pointed to lower abdomen/pelvic area. No fevers/chills, resp e/u. Patient mentation at baseline, follows commands.

## 2018-09-19 LAB — URINALYSIS, ROUTINE W REFLEX MICROSCOPIC
Glucose, UA: NEGATIVE mg/dL
HGB URINE DIPSTICK: NEGATIVE
Ketones, ur: NEGATIVE mg/dL
LEUKOCYTE UA: NEGATIVE
NITRITE: NEGATIVE
PH: 5 (ref 5.0–8.0)
Protein, ur: 30 mg/dL — AB
SPECIFIC GRAVITY, URINE: 1.023 (ref 1.005–1.030)

## 2018-09-19 LAB — VALPROIC ACID LEVEL: VALPROIC ACID LVL: 22 ug/mL — AB (ref 50.0–100.0)

## 2018-09-19 LAB — CBG MONITORING, ED: GLUCOSE-CAPILLARY: 111 mg/dL — AB (ref 70–99)

## 2018-09-19 LAB — AMMONIA: Ammonia: 28 umol/L (ref 9–35)

## 2018-09-19 NOTE — Discharge Instructions (Addendum)

## 2018-09-21 DIAGNOSIS — R822 Biliuria: Secondary | ICD-10-CM | POA: Diagnosis not present

## 2018-09-21 DIAGNOSIS — R945 Abnormal results of liver function studies: Secondary | ICD-10-CM | POA: Diagnosis not present

## 2018-09-21 DIAGNOSIS — I10 Essential (primary) hypertension: Secondary | ICD-10-CM | POA: Diagnosis not present

## 2018-09-21 DIAGNOSIS — R319 Hematuria, unspecified: Secondary | ICD-10-CM | POA: Diagnosis not present

## 2018-09-23 DIAGNOSIS — R748 Abnormal levels of other serum enzymes: Secondary | ICD-10-CM | POA: Diagnosis not present

## 2018-09-25 ENCOUNTER — Other Ambulatory Visit: Payer: Self-pay | Admitting: Internal Medicine

## 2018-09-25 DIAGNOSIS — R945 Abnormal results of liver function studies: Secondary | ICD-10-CM

## 2018-09-25 DIAGNOSIS — R7989 Other specified abnormal findings of blood chemistry: Secondary | ICD-10-CM

## 2018-09-25 DIAGNOSIS — R822 Biliuria: Secondary | ICD-10-CM

## 2018-10-02 ENCOUNTER — Ambulatory Visit
Admission: RE | Admit: 2018-10-02 | Discharge: 2018-10-02 | Disposition: A | Discharge status: Home / Self Care | Payer: Medicare Other | Source: Ambulatory Visit | Attending: Internal Medicine | Admitting: Internal Medicine

## 2018-10-02 DIAGNOSIS — R7989 Other specified abnormal findings of blood chemistry: Secondary | ICD-10-CM

## 2018-10-02 DIAGNOSIS — R822 Biliuria: Secondary | ICD-10-CM

## 2018-10-02 DIAGNOSIS — K5730 Diverticulosis of large intestine without perforation or abscess without bleeding: Secondary | ICD-10-CM | POA: Diagnosis not present

## 2018-10-02 DIAGNOSIS — K769 Liver disease, unspecified: Secondary | ICD-10-CM | POA: Diagnosis not present

## 2018-10-02 DIAGNOSIS — R945 Abnormal results of liver function studies: Secondary | ICD-10-CM

## 2018-10-05 DIAGNOSIS — R319 Hematuria, unspecified: Secondary | ICD-10-CM | POA: Diagnosis not present

## 2018-10-05 DIAGNOSIS — R945 Abnormal results of liver function studies: Secondary | ICD-10-CM | POA: Diagnosis not present

## 2018-10-05 DIAGNOSIS — R822 Biliuria: Secondary | ICD-10-CM | POA: Diagnosis not present

## 2018-10-05 DIAGNOSIS — I10 Essential (primary) hypertension: Secondary | ICD-10-CM | POA: Diagnosis not present

## 2018-10-07 DIAGNOSIS — R740 Nonspecific elevation of levels of transaminase and lactic acid dehydrogenase [LDH]: Secondary | ICD-10-CM | POA: Diagnosis not present

## 2018-10-07 DIAGNOSIS — F0281 Dementia in other diseases classified elsewhere with behavioral disturbance: Secondary | ICD-10-CM | POA: Diagnosis not present

## 2018-10-07 DIAGNOSIS — R945 Abnormal results of liver function studies: Secondary | ICD-10-CM | POA: Diagnosis not present

## 2018-10-07 DIAGNOSIS — F29 Unspecified psychosis not due to a substance or known physiological condition: Secondary | ICD-10-CM | POA: Diagnosis not present

## 2018-10-07 DIAGNOSIS — F0391 Unspecified dementia with behavioral disturbance: Secondary | ICD-10-CM | POA: Diagnosis not present

## 2018-10-12 NOTE — Progress Notes (Signed)
[name]    Provider:  Shawnie Dapper Referring Provider: Pearson Grippe, MD Primary Care Physician:  Pearson Grippe, MD    Interval history 10/13/2018: He is here today with his wife, [name], who aids in history. She reports that he is more combative at nights. She is unsure about hallucinations. He does occasionally point his finger in the air. He is unable to dress or bath himself. He is incontinent. He is with an adult memory center during the day and stays at home with [facility] at night. He is eating well. He drinks mostly water. No trouble swallowing. Weight stable. He is mostly nonverbal. Will nod occasionally. [name] reports having a good support system with her brother who lives across the street. Her daughter helps. Her granddaughter is a Engineer, civil (consulting) and comes over frequently.   He is taking Aricept  daily. He could not tolerate Namenda. He is taking  (5ml), previously taking , of Valproic Acid due to recent liver enzyme elevation. His PCP is monitoring labs closely. [name] reports that she gave him clonazepam and sertraline as prescribed last year but he was so sedated she did not feel comfortable continuing. He has not taken either medication recently.    Interval history 09/22/2017: Very combative in the afternoons. No hallucinations. He is doing well during the day. He has very few good days. He goes every day. He goes 5 days a week 730-415pm. He eats supper and afterwards she tries to get him changed. He will swing at her. On the weekends, he will have breakfast and he will go back to bed until 5pm and then he goes back to bed. There is considerable agitation and patient is aggressive and physical. There is also caregiver burnout. Discussed this with wife and ways to decrease stress. Recommend a memory unit ultimately, wife will have difficulty affording.  Interval history 03/18/2016: More agitated, sleep walking. He naps during the day then He is very sleepy during the day, he  will swing at her if she tries to stop him from sleeping. He is going to an adult daycare. Not aside effect of the medication. He is acting out at the center as well. Lots of agitation. Depakote has been helping with the agitation. We can increase the medication if needed. He tries to get up, he gets up every night, wife redirects him. Some nights he sleeps well. Other nights he does not. He goes to be at 6-630 and I suggest he goes to sleep later instead. No significant snoring at night, not stopping breathing. Memory worsening. Better when he is at the adult center in the day.   Interval history 12/14/2015: Wife found him, he was stiff. Eye closed. Arms folded over his chest, couldn;t pull them apart, fingers intertwined and folded on his chest. He was still stiff on the stretcher. Not talking. Eyes closed. No urination or defecation. Marland Kitchen He was confused all day Sunday. EEG showed nonspecific slowing. The rigidity lasted 5-10 minutes. He did not urinate or defecate. He was confused the rest of the day in the ED, he was not back to baseline. She thought he was dead. His lactate was elevated without other significant changes in BMP. Even EMS had a very difficult time putting him on the stricture due to his stiffness. CT of the head showed no acute processes or events.  HISTORY OF PRESENT ILLNESS: Mr. 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 He was last seen by Dr. Hosie Poisson.  He returns today for follow-up. The patient is currently taking Aricept 23 mg daily as well as Namenda XR 28 mg. The patient reports that his memory has gotten better. His wife reports that his memory has gotten worse. The patient continues to complete ADLs independently. His wife states that occasionally he will not put on clean clothes. She states that he tends to lay his clothes out for him. The patient no longer operates a motor vehicle. The wife states the patient had to give up his  job due to his memory. Wife now completes all the finances. Patient was having some hallucinations. His wife states that occasionally during the night he will talk in his sleep but denies the patient may imbalance. She states occasionally if he gets up to go the bathroom during the night he may get confused. She is normally able to tell him to go back to sleep and the patient calms down. She reports that the patient continues to sleep a lot during the day. He is also seen by a urologist for BPH. She states currently they are checking the nerves in his bladder every week for 12 weeks. Other than this no new medical issues since last seen.  Interval history 10/16/2015: Dr. Lucia Gaskins. I am seeing patient today for the first time. Things are going well per patient. But not per wife. He is still on Aricept. Memory continues to decline. Lots of confusion expressibe himself. Started around 2010 and has been progressively slowly worsening. 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 Wife had noticed changes at home even before that. Trouble with short term memory, trouble remembering conversations. He is not driving. Wife provides all information. He goes to a daycare center 5 days a week. Wife works 4 hours a day. They keep him very active and he is very happy there. He is wandering at night. She is able to redirect him into bed. During the day he will get defiant and not want to put on the depends. Right now she feels like she is ok. No delusions or hallucinations. [facility] [address].   HISTORY 01/26/14 ([facility]): [name] is a 78 y.o. male here as a follow up from Dr. Shana Chute for cognitive decline with prior visit on 10/2013. At that visit his Aricept was increased to . Minimal to no improvement noted with this. Continues to have visual and auditory hallucinations but wife notes this has decreased slightly since last visit.  Wife notes that he can be easily redirected when he is having hallucinations. She notes that he sleeps a lot during the day, will watch a lot of TV. Notes he is not as active as he used to be. He is not currently driving. No difficulty eating, no choking. Wife notes he has been losing some weight, appetite is decreased. Has strong family history of memory loss.   Initial visit 05/2013: Started noticing around 4 years ago, has been getting progressively worse, increased decline in the past yar. Trouble with short term memory, trouble remembering conversations. Wife manages the finances, this changed 3 years ago. Continues driving, short distances. Wife has some concerns about his driving.No episodes of getting lost. Has good remote memory. No history of strokes or TIAs. Has sister with dementia. No tobacco or EtOH. 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 Has some nighttime agitation. No hallucinations.  Aricept and Namenda started around 3 to 4 years ago. Wife has not noticed much benefit.   Has had multiple syncopal episodes, following with Dr Shana Chute, adjusting his blood pressure medication. Gets very diaphoretic during these episodes. No extremity shaking.    Review of Systems: Patient complains of symptoms per HPI as well as the following symptoms: Fatigue, swelling in legs, hearing loss, moles, snoring, incontinence, memory loss, confusion, weakness, sleepiness, snoring, anxiety, not enough sleep, decreased energy, disinterest in activities. Pertinent negatives per HPI. All others negative.   Social History   Socioeconomic History  . Marital status: Married    Spouse name: mary  . Number of children: 2  . Years of education: college  . Highest education level: Not on file  Occupational History  . Occupation: retired  Engineer, production  . Financial resource strain: Not on file  . Food insecurity:    Worry: Not on file    Inability: Not  on file  . Transportation needs:    Medical: Not on file    Non-medical: Not on file  Tobacco Use  . Smoking status: Never Smoker  . Smokeless tobacco: Never Used  Substance and Sexual Activity  . Alcohol use: No    Alcohol/week: 0.0 standard drinks  . Drug use: No  . Sexual activity: Not on file  Lifestyle  . Physical activity:    Days per week: Not on file    Minutes per session: Not on file  . Stress: Not on file  Relationships  . Social connections:    Talks on phone: Not on file    Gets together: Not on file    Attends religious service: Not on file    Active member of club or organization: Not on file    Attends meetings of clubs or organizations: Not on file    Relationship status: Not on file  . Intimate partner violence:    Fear of current or ex partner: Not on file    Emotionally abused: Not on file    Physically abused: Not on file    Forced sexual activity: Not on file  Other Topics Concern  . Not on file  Social History Narrative   Patient is married [name]), has 2 children   Patient is right handed   Education level is college   Drinks about 1 cup of coffee daily     Family History  Problem Relation Age of Onset  . Hypertension Mother   . Dementia Sister     Past Medical History:  Diagnosis Date  . Dementia (HCC)   . Hypertension     Past Surgical History:  Procedure Laterality Date  . NO PAST SURGERIES      Current Outpatient Medications  Medication Sig Dispense Refill  . amLODipine (NORVASC) 10 MG tablet Take 10 mg by mouth daily.     . clonazePAM (KLONOPIN) 0.5 MG tablet Give 1-2 tablets 3-4x a day as needed for agitation. 90 tablet 5  . [facility] SENNA PLUS 8.6-50 MG tablet Take 2 tablets by mouth daily as needed for constipation.  3  . donepezil (ARICEPT) 23 MG TABS tablet TAKE 1 TABLET BY MOUTH AT BEDTIME 30 tablet 11  . losartan (COZAAR) 100 MG tablet Take 100 mg daily by mouth.    . sertraline (ZOLOFT) 25 MG tablet Take 1 tablet (25 mg  total) by mouth daily. 30 tablet 11  . Valproate Sodium (VALPROIC ACID) 250 MG/5ML SOLN Take 10 mLs 2 (two) times daily  by mouth.     No current facility-administered medications for this visit.     Allergies as of 10/13/2018  . (No Known Allergies)    Vitals: There were no vitals taken for this visit. Last Weight:  Wt Readings from Last 1 Encounters:  09/22/17 163 lb (73.9 kg)   Last Height:   Ht Readings from Last 1 Encounters:  09/22/17 5\' 11"  (1.803 m)    MMSE - Mini Mental State Exam 09/22/2017 09/18/2016 04/18/2015  Not completed: Unable to complete - -  Orientation to time - 0 0  Orientation to Place - 0 3  Registration - 3 3  Attention/ Calculation - 0 0  Recall - 0 0  Language- name 2 objects - 1 2  Language- repeat - 0 1  Language- follow 3 step command - 1 3  Language- read & follow direction - 1 1  Write a sentence - 0 0  Copy design - 0 1  Total score - 6 14   He is alert, smiles, will state name and nod occasionally, does not follow commands Cardiology: regular rhythm, no murmur noted Respiratory: clear to auscultation bilaterally Cranial nerve II-XII: limited due to patient not following commands today, Pupils were equal round reactive to light. Extraocular movements were full, visual field were full on confrontational test. Facial sensation and strength were normal. Motor: The motor testing reveals 5 over 5 strength of all 4 extremities. Good symmetric motor tone is noted throughout.  Sensory: unable to assess Coordination: unable to assess Gait and station: Gait is normal.     Assessment/Plan: 78 y.o. year old male has a past medical history of Dementia and Hypertension. Neurologic exam is nonfocal. Here with his wife who is his caretaker. He was admitted to the hospital in the past for an episode of severe stiffness, followed by many hours of confusion, elevated lactate in the emergency without significant BMP changes to indicate dehydration or other  cause, no UTI: EEG showed nonspecific slowing possibly postictal changes are due to his dementia. Could not rule out the possibility of seizures.   1. 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 We'll  continue Valproic Acid 250mg  outpatient, this may also help with his behavioral problems due to his dementia as well, PCP is monitoring liver enzymes. 3. Discussed caretaker burnout with wife 4. Continue Aricept. Does not tolerate Namenda 5. For agitation will start Zoloft 25mg  solution, and can increase up to 100mg  as needed and tolerated 6. Can consider Risperdal or Seroquel, discussed risks and that these meds are not FDA approved in the elderly but are often used for behavioral problems.   Will follow up in 1 year, sooner if needed   Shawnie Dapper, [facility]  [facility] [address] [place], [place] [address]  Phone [phone] Fax [phone]

## 2018-10-13 ENCOUNTER — Encounter: Payer: Self-pay | Admitting: Family Medicine

## 2018-10-13 ENCOUNTER — Ambulatory Visit (INDEPENDENT_AMBULATORY_CARE_PROVIDER_SITE_OTHER): Payer: Medicare Other | Admitting: Family Medicine

## 2018-10-13 VITALS — BP 133/80 | HR 68 | Ht 71.5 in | Wt 167.6 lb

## 2018-10-13 DIAGNOSIS — F0280 Dementia in other diseases classified elsewhere without behavioral disturbance: Secondary | ICD-10-CM

## 2018-10-13 DIAGNOSIS — F0281 Dementia in other diseases classified elsewhere with behavioral disturbance: Secondary | ICD-10-CM

## 2018-10-13 DIAGNOSIS — G309 Alzheimer's disease, unspecified: Secondary | ICD-10-CM | POA: Diagnosis not present

## 2018-10-13 MED ORDER — SERTRALINE HCL 20 MG/ML PO CONC: 25.0000 mg | Freq: Every day | ORAL | 11 refills | Status: DC

## 2018-10-13 NOTE — Progress Notes (Signed)
Personally  participated in, made any corrections needed, and agree with history, physical, neuro exam,assessment and plan as stated.     Antonia Ahern, MD [name]     

## 2018-10-13 NOTE — Patient Instructions (Addendum)
Continue Aricept as prescribed.   Start sertraline 25mg  daily  Continue 74ml of Valproic Acid as directed by PCP, have PCP continue to monitor liver function tests  Caregiver Guide Dementia is a term used to describe a number of symptoms that affect memory and thinking. The most common symptoms include:  Memory loss.  Trouble with language and communication.  Trouble concentrating.  Poor judgment.  Problems with reasoning.  Child-like behavior and language.  Extreme anxiety.  Angry outbursts.  Wandering from home or public places. Dementia usually gets worse slowly over time. In the early stages, people with dementia can stay independent and safe with some help. In later stages, they need help with daily tasks such as dressing, grooming, and using the bathroom. How to help the person with dementia cope Dementia can be frightening and confusing. Here are some tips to help the person with dementia cope with changes caused by the disease. General tips  Keep the person on track with his or her routine.  Try to identify areas where the person may need help.  Be supportive, patient, calm, and encouraging.  Gently remind the person that adjusting to changes takes time.  Help with the tasks that the person has asked for help with.  Keep the person involved in daily tasks and decisions as much as possible.  Encourage conversation, but try not to get frustrated or harried if the person struggles to find words or does not seem to appreciate your help. Communication tips  When the person is talking or seems frustrated, make eye contact and hold the person's hand.  Ask specific questions that need yes or no answers.  Use simple words, short sentences, and a calm voice. Only give one direction at a time.  When offering choices, limit them to just 1 or 2.  Avoid correcting the person in a negative way.  If the person is struggling to find the right words, gently try to help  him or her. How to recognize symptoms of stress Symptoms of stress in caregivers include:  Feeling frustrated or angry with the person with dementia.  Denying that the person has dementia or that his or her symptoms will not improve.  Feeling hopeless and unappreciated.  Difficulty sleeping.  Difficulty concentrating.  Feeling anxious, irritable, or depressed.  Developing stress-related health problems.  Feeling like you have too little time for your own life. Follow these instructions at home:   Make sure that you and the person you are caring for: ? Get regular sleep. ? Exercise regularly. ? Eat regular, nutritious meals. ? Drink enough fluid to keep your urine clear or pale yellow. ? Take over-the-counter and prescription medicines only as told by your health care providers. ? Attend all scheduled health care appointments.  Join a support group with others who are caregivers.  Ask about respite care resources so that you can have a regular break from the stress of caregiving.  Look for signs of stress in yourself and in the person you are caring for. If you notice signs of stress, take steps to manage it.  Consider any safety risks and take steps to avoid them.  Organize medications in a pill box for each day of the week.  Create a plan to handle any legal or financial matters. Get legal or financial advice if needed.  Keep a calendar in a central location to remind the person of appointments or other activities. Tips for reducing the risk of injury  Keep floors clear  of clutter. Remove rugs, magazine racks, and floor lamps.  Keep hallways well lit, especially at night.  Put a handrail and nonslip mat in the bathtub or shower.  Put childproof locks on cabinets that contain dangerous items, such as medicines, alcohol, guns, toxic cleaning items, sharp tools or utensils, matches, and lighters.  Put the locks in places where the person cannot see or reach them  easily. This will help ensure that the person does not wander out of the house and get lost.  Be prepared for emergencies. Keep a list of emergency phone numbers and addresses in a convenient area.  Remove car keys and lock garage doors so that the person does not try to get in the car and drive.  Have the person wear a bracelet that tracks locations and identifies the person as having memory problems. This should be worn at all times for safety. Where to find support: Many individuals and organizations offer support. These include:  Support groups for people with dementia and for caregivers.  Counselors or therapists.  Home health care services.  Adult day care centers. Where to find more information Alzheimer's Association: [employer] Contact a health care provider if:  The person's health is rapidly getting worse.  You are no longer able to care for the person.  Caring for the person is affecting your physical and emotional health.  The person threatens himself or herself, you, or anyone else. Summary  Dementia is a term used to describe a number of symptoms that affect memory and thinking.  Dementia usually gets worse slowly over time.  Take steps to reduce the person's risk of injury, and to plan for future care.  Caregivers need support, relief from caregiving, and time for their own lives. This information is not intended to replace advice given to you by your health care provider. Make sure you discuss any questions you have with your health care provider. Document Released: 06/25/2016 Document Revised: 06/25/2016 Document Reviewed: 06/25/2016 Elsevier Interactive Patient Education  2019 Elsevier Inc.    Sertraline tablets What is this medicine? SERTRALINE (SER tra leen) is used to treat depression. It may also be used to treat obsessive compulsive disorder, panic disorder, post-trauma stress, premenstrual dysphoric disorder (PMDD) or social anxiety. This  medicine may be used for other purposes; ask your health care provider or pharmacist if you have questions. COMMON BRAND NAME(S): Zoloft What should I tell my health care provider before I take this medicine? They need to know if you have any of these conditions: -bleeding disorders -bipolar disorder or a family history of bipolar disorder -glaucoma -heart disease -high blood pressure -history of irregular heartbeat -history of low levels of calcium, magnesium, or potassium in the blood -if you often drink alcohol -liver disease -receiving electroconvulsive therapy -seizures -suicidal thoughts, plans, or attempt; a previous suicide attempt by you or a family member -take medicines that treat or prevent blood clots -thyroid disease -an unusual or allergic reaction to sertraline, other medicines, foods, dyes, or preservatives -pregnant or trying to get pregnant -breast-feeding How should I use this medicine? Take this medicine by mouth with a glass of water. Follow the directions on the prescription label. You can take it with or without food. Take your medicine at regular intervals. Do not take your medicine more often than directed. Do not stop taking this medicine suddenly except upon the advice of your doctor. Stopping this medicine too quickly may cause serious side effects or your condition may worsen. A special  MedGuide will be given to you by the pharmacist with each prescription and refill. Be sure to read this information carefully each time. Talk to your pediatrician regarding the use of this medicine in children. While this drug may be prescribed for children as young as 7 years for selected conditions, precautions do apply. Overdosage: If you think you have taken too much of this medicine contact a poison control center or emergency room at once. NOTE: This medicine is only for you. Do not share this medicine with others. What if I miss a dose? If you miss a dose, take it as  soon as you can. If it is almost time for your next dose, take only that dose. Do not take double or extra doses. What may interact with this medicine? Do not take this medicine with any of the following medications: -cisapride -dofetilide -dronedarone -linezolid -MAOIs like Carbex, Eldepryl, Marplan, Nardil, and Parnate -methylene blue (injected into a vein) -pimozide -thioridazine This medicine may also interact with the following medications: -alcohol -amphetamines -aspirin and aspirin-like medicines -certain medicines for depression, anxiety, or psychotic disturbances -certain medicines for fungal infections like ketoconazole, fluconazole, posaconazole, and itraconazole -certain medicines for irregular heart beat like flecainide, quinidine, propafenone -certain medicines for migraine headaches like almotriptan, eletriptan, frovatriptan, naratriptan, rizatriptan, sumatriptan, zolmitriptan -certain medicines for sleep -certain medicines for seizures like carbamazepine, valproic acid, phenytoin -certain medicines that treat or prevent blood clots like warfarin, enoxaparin, dalteparin -cimetidine -digoxin -diuretics -fentanyl -isoniazid -lithium -NSAIDs, medicines for pain and inflammation, like ibuprofen or naproxen -other medicines that prolong the QT interval (cause an abnormal heart rhythm) -rasagiline -safinamide -supplements like St. John's wort, kava kava, valerian -tolbutamide -tramadol -tryptophan This list may not describe all possible interactions. Give your health care provider a list of all the medicines, herbs, non-prescription drugs, or dietary supplements you use. Also tell them if you smoke, drink alcohol, or use illegal drugs. Some items may interact with your medicine. What should I watch for while using this medicine? Tell your doctor if your symptoms do not get better or if they get worse. Visit your doctor or health care professional for regular checks on  your progress. Because it may take several weeks to see the full effects of this medicine, it is important to continue your treatment as prescribed by your doctor. Patients and their families should watch out for new or worsening thoughts of suicide or depression. Also watch out for sudden changes in feelings such as feeling anxious, agitated, panicky, irritable, hostile, aggressive, impulsive, severely restless, overly excited and hyperactive, or not being able to sleep. If this happens, especially at the beginning of treatment or after a change in dose, call your health care professional. Bonita Quin may get drowsy or dizzy. Do not drive, use machinery, or do anything that needs mental alertness until you know how this medicine affects you. Do not stand or sit up quickly, especially if you are an older patient. This reduces the risk of dizzy or fainting spells. Alcohol may interfere with the effect of this medicine. Avoid alcoholic drinks. Your mouth may get dry. Chewing sugarless gum or sucking hard candy, and drinking plenty of water may help. Contact your doctor if the problem does not go away or is severe. What side effects may I notice from receiving this medicine? Side effects that you should report to your doctor or health care professional as soon as possible: -allergic reactions like skin rash, itching or hives, swelling of the face, lips, or tongue -  anxious -black, tarry stools -changes in vision -confusion -elevated mood, decreased need for sleep, racing thoughts, impulsive behavior -eye pain -fast, irregular heartbeat -feeling faint or lightheaded, falls -feeling agitated, angry, or irritable -hallucination, loss of contact with reality -loss of balance or coordination -loss of memory -painful or prolonged erections -restlessness, pacing, inability to keep still -seizures -stiff muscles -suicidal thoughts or other mood changes -trouble sleeping -unusual bleeding or bruising -unusually  weak or tired -vomiting Side effects that usually do not require medical attention (report to your doctor or health care professional if they continue or are bothersome): -change in appetite or weight -change in sex drive or performance -diarrhea -increased sweating -indigestion, nausea -tremors This list may not describe all possible side effects. Call your doctor for medical advice about side effects. You may report side effects to FDA at 1-800-FDA-1088. Where should I keep my medicine? Keep out of the reach of children. Store at room temperature between 15 and 30 degrees C (59 and 86 degrees F). Throw away any unused medicine after the expiration date. NOTE: This sheet is a summary. It may not cover all possible information. If you have questions about this medicine, talk to your doctor, pharmacist, or health care provider.  2019 Elsevier/Gold Standard (2016-07-26 14:17:49)

## 2018-10-21 DIAGNOSIS — R945 Abnormal results of liver function studies: Secondary | ICD-10-CM | POA: Diagnosis not present

## 2018-10-28 DIAGNOSIS — F0390 Unspecified dementia without behavioral disturbance: Secondary | ICD-10-CM | POA: Diagnosis not present

## 2018-10-28 DIAGNOSIS — R945 Abnormal results of liver function studies: Secondary | ICD-10-CM | POA: Diagnosis not present

## 2018-10-28 DIAGNOSIS — F0391 Unspecified dementia with behavioral disturbance: Secondary | ICD-10-CM | POA: Diagnosis not present

## 2018-10-28 DIAGNOSIS — I10 Essential (primary) hypertension: Secondary | ICD-10-CM | POA: Diagnosis not present

## 2018-11-18 ENCOUNTER — Telehealth: Payer: Self-pay

## 2018-11-18 MED ORDER — SERTRALINE HCL 20 MG/ML PO CONC: 25.0000 mg | Freq: Every day | ORAL | 0 refills | Status: DC

## 2018-11-18 NOTE — Telephone Encounter (Signed)
Per Amy it is ok to fill for 90 day supply

## 2018-11-27 DIAGNOSIS — F0281 Dementia in other diseases classified elsewhere with behavioral disturbance: Secondary | ICD-10-CM | POA: Diagnosis not present

## 2018-11-27 DIAGNOSIS — Z79899 Other long term (current) drug therapy: Secondary | ICD-10-CM | POA: Diagnosis not present

## 2018-11-27 DIAGNOSIS — F329 Major depressive disorder, single episode, unspecified: Secondary | ICD-10-CM | POA: Diagnosis not present

## 2018-11-27 DIAGNOSIS — Z7189 Other specified counseling: Secondary | ICD-10-CM | POA: Diagnosis not present

## 2018-11-27 DIAGNOSIS — G309 Alzheimer's disease, unspecified: Secondary | ICD-10-CM | POA: Diagnosis not present

## 2018-12-21 ENCOUNTER — Telehealth: Payer: Self-pay | Admitting: Family Medicine

## 2018-12-21 NOTE — Telephone Encounter (Signed)
I called an spoke to wife.  Pt is worsening dementia, with some aggressive tendency.  Sertraline not helping.   I relayed due to Due to current COVID 19 pandemic, our office is severely reducing in office visits until further notice, in order to minimize the risk to our patients and healthcare providers.  Pt understands that although there may be some limitations with this type of visit, we will take all precautions to reduce any security or privacy concerns.  Pt understands that this will be treated like an in office visit and we will file with pt's insurance, and there may be a patient responsible charge related to this service. Amy Lomax, NP preferred to have pt not come in office due to covid 19.  Wife consented to TELEPHONE VISIT.  This was scheduled 12-22-18 at 1030, when CNA is there with pt, so would be able to talk more freely.

## 2018-12-21 NOTE — Telephone Encounter (Signed)
Amy, you could also increase Sertraline if he is on a low dose, I like to target 100mg . But if he is agitated and combatitive I would add the Seroquel first and increase the zoloft later thanks

## 2018-12-21 NOTE — Telephone Encounter (Signed)
Patient 's wife requesting a office apt asap because patient is getting worse. Patient's does not not want to do a video visit  Or telephone call .

## 2018-12-21 NOTE — Telephone Encounter (Signed)
Thank you :)

## 2018-12-21 NOTE — Telephone Encounter (Signed)
Good morning Dr Lucia Gaskins! Mr Donald [name] is continuing to have difficulty with aggression and progressed dementia. Can you take a look at this case and guide me on the next steps of treatment. I can see him tomorrow in the office if needed but not sure it is the best plan. Would you consider Seroquel? He is taking sertraline and clonopin.

## 2018-12-21 NOTE — Telephone Encounter (Signed)
I would start him on 25mg  seroquel when he gets back from daycare. From your note it looks like he is fine in the morning and during the day so no need to give him a dose in the morning. She can try giving him seroquel 25mg  in the afternoon or early evening when he becomes agitated. We can increase to 50mg  if needed (then 75mg  then 100mg  if needed) and see how that works. His QTc is normal which is good. His pcp is watching his liver enzymes closely, continue to do that. If he does not tolerate we can try Risperdal.

## 2018-12-22 ENCOUNTER — Other Ambulatory Visit: Payer: Self-pay

## 2018-12-22 ENCOUNTER — Ambulatory Visit (INDEPENDENT_AMBULATORY_CARE_PROVIDER_SITE_OTHER): Payer: Medicare Other | Admitting: Family Medicine

## 2018-12-22 ENCOUNTER — Encounter: Payer: Self-pay | Admitting: Family Medicine

## 2018-12-22 DIAGNOSIS — G309 Alzheimer's disease, unspecified: Secondary | ICD-10-CM

## 2018-12-22 DIAGNOSIS — F0281 Dementia in other diseases classified elsewhere with behavioral disturbance: Secondary | ICD-10-CM

## 2018-12-22 MED ORDER — QUETIAPINE FUMARATE 25 MG PO TABS: 25.0000 mg | ORAL_TABLET | Freq: Every day | ORAL | 5 refills | Status: DC

## 2018-12-22 NOTE — Progress Notes (Addendum)
PATIENT: Donald [name] DOB: 1941-03-17  REASON FOR VISIT: follow up HISTORY FROM: patient  Virtual Visit via Telephone Note  I connected with Donald [name] on 12/22/18 at 10:30 AM EDT by telephone and verified that I am speaking with the correct person using two identifiers.   I discussed the limitations, risks, security and privacy concerns of performing an evaluation and management service by telephone and the availability of in person appointments. I also discussed with the patient that there may be a patient responsible charge related to this service. The patient expressed understanding and agreed to proceed.   History of Present Illness:  12/22/18 Donald [name] Delamar is a 78 y.o. male today for follow up.  He is unable to participate in conversation over the phone today and history is aided by his wife, [name].  She reports that since last being seen in March his behavior has continued to worsen.  She reports increased agitation.  Due to COVID-19 he is being required to stay at home as his care center has been closed.  [name] reports that she has limited resources as her daughter is recovering from a kidney transplant.  She also has church members that typically help but have been unavailable due to the virus.  She states that Donald [name] is agitated throughout the day.  Some days are better than others.  He has a decreased appetite and is sleeping more.  She reports having a difficult time getting him to take medications as well as getting him in and out of the bed.  There have been no falls.   Observations/Objective:  Unable to assess due to telephone visit    Assessment and Plan:  78 y.o. year old male  has a past medical history of Dementia (HCC) and Hypertension. here with    ICD-10-CM   1. 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  His wife is a primary caregiver and has limited resources.  We have discussed in detail options for assistance with caring for her husband.  For now she wishes for Donald [name] to remain in the home.  He is tolerating sertraline at 25 mg.  We will continue this medication for now.  I have also added Seroquel 25 mg in the evening.  I have discussed possible side effects of this medication with Fredericks wife who verbalizes understanding.  I would like to keep a very close follow-up on Donald [name].  I have recommended a 1871-month follow-up which has been scheduled with me.  [name] verbalizes understanding and agreement with this plan and will call for any new or worsening symptoms.  No orders of the defined types were placed in this encounter.   Meds ordered this encounter  Medications  . QUEtiapine (SEROQUEL) 25 MG tablet    Sig: Take 1 tablet (25 mg total) by mouth at bedtime.    Dispense:  30 tablet    Refill:  5    Order Specific Question:   Supervising Provider    Answer:   Anson FretAHERN, ANTONIA B [id number]]    Follow Up Instructions:  I discussed the assessment and treatment plan with the patient. The patient was provided an opportunity to ask questions and all were answered. The patient agreed with the plan and demonstrated an understanding of the instructions.   The patient was advised to call  back or seek an in-person evaluation if the symptoms worsen or if the condition fails to improve as anticipated.  I provided 25 minutes of non-face-to-face time during this encounter.  Patient's wife is with patient in their residence during teleconference.  Provider is located in the office.  Alverda Skeans, RN helped to facilitate visit.   Shawnie Dapper, NP

## 2018-12-22 NOTE — Progress Notes (Signed)
Made any corrections needed, and agree with history, physical, neuro exam,assessment and plan as stated.     Antonia Ahern, MD [name]  

## 2019-01-11 DIAGNOSIS — Z20828 Contact with and (suspected) exposure to other viral communicable diseases: Secondary | ICD-10-CM | POA: Diagnosis not present

## 2019-01-28 DIAGNOSIS — M7989 Other specified soft tissue disorders: Secondary | ICD-10-CM | POA: Diagnosis not present

## 2019-01-28 DIAGNOSIS — I10 Essential (primary) hypertension: Secondary | ICD-10-CM | POA: Diagnosis not present

## 2019-01-28 DIAGNOSIS — F0391 Unspecified dementia with behavioral disturbance: Secondary | ICD-10-CM | POA: Diagnosis not present

## 2019-02-03 ENCOUNTER — Encounter: Payer: Self-pay | Admitting: Family Medicine

## 2019-02-03 ENCOUNTER — Other Ambulatory Visit: Payer: Self-pay

## 2019-02-03 ENCOUNTER — Ambulatory Visit (INDEPENDENT_AMBULATORY_CARE_PROVIDER_SITE_OTHER): Payer: Medicare Other | Admitting: Family Medicine

## 2019-02-03 VITALS — BP 126/78 | HR 68 | Temp 98.4°F | Ht 71.0 in | Wt 154.0 lb

## 2019-02-03 DIAGNOSIS — G309 Alzheimer's disease, unspecified: Secondary | ICD-10-CM

## 2019-02-03 DIAGNOSIS — F0280 Dementia in other diseases classified elsewhere without behavioral disturbance: Secondary | ICD-10-CM | POA: Diagnosis not present

## 2019-02-03 MED ORDER — HYDROXYZINE HCL 25 MG PO TABS: 25.0000 mg | ORAL_TABLET | Freq: Two times a day (BID) | ORAL | 5 refills | Status: DC | PRN

## 2019-02-03 NOTE — Progress Notes (Signed)
PATIENT: Donald [name] DOB: 01/23/1941  REASON FOR VISIT: follow up HISTORY FROM: patient  Chief Complaint  Patient presents with   Follow-up    pt with wife, rm 10. pt wife states medication was changed and hasn't felt that there has been improvement. pt wife states he has gotten to the point where he is mostly  non verbal, he doesnt know who she is a lot of the time. patient unable to complete the MMSE.      HISTORY OF PRESENT ILLNESS: Today 02/03/19 Donald [name] is a 78 y.o. male here today for follow up of dementia. We started Seroquel 25mg  for increased restlessness and agitation at his last visit in 12/2018.  His wife, Donald [name], reports that this medication did not help at all.  In fact, she feels like he may be a little more agitated now.  She reports that he has recently gone back to the dementia center.  He is usually there from 8 AM to 4 PM.  She has noticed that his memory is much worse.  There are more times than not that he does not know who she is.  He is mostly nonverbal.  He will occasionally nod appropriately.  He is aggressive at times.  She is trying to maintain his care at home.  She has limited support.  She does have her daughter that comes over to visit but is not well herself.  Donald [name] is beginning to think about other options for living arrangements.  She denies any injury.  She feels that she is safe in her environment.  HISTORY: (copied from my note on 10/13/2018)  Interval history 10/13/2018: He is here today with his wife, Donald [name], who aids in history. She reports that he is more combative at nights. She is unsure about hallucinations. He does occasionally point his finger in the air. He is unable to dress or bath himself. He is incontinent. He is with an adult memory center during the day and [name]. He is eating well. He drinks mostly water. No trouble swallowing. Weight stable. He is mostly nonverbal. Will nod  occasionally. Donald [name] reports having a good support system with her brother who lives across the street. Her daughter helps. Her granddaughter is a Engineer, civil (consulting)nurse and comes over frequently.   He is taking Aricept 23mg  daily. He could not tolerate Namenda. He is taking 250mg  (5ml), previously taking 500mg , of Valproic Acid due to recent liver enzyme elevation. His PCP is monitoring labs closely. Donald [name] reports that she gave him clonazepam and sertraline as prescribed last year but he was so sedated she did not feel comfortable continuing. He has not taken either medication recently.    Interval history 09/22/2017: Very combative in the afternoons. No hallucinations. He is doing well during the day. He has very few good days. He goes every day. He goes 5 days a week 730-415pm. He eats supper and afterwards she tries to get him changed. He will swing at her. On the weekends, he will have breakfast and he will go back to bed until 5pm and then he goes back to bed. There is considerable agitation and patient is aggressive and physical. There is also caregiver burnout. Discussed this with wife and ways to decrease stress. Recommend a memory unit ultimately, wife will have difficulty affording.  Interval history 03/18/2016: More agitated, sleep walking. He naps during the day then He is very sleepy during the day, he will swing at  her if she tries to stop him from sleeping. He is going to an adult daycare. Not aside effect of the medication. He is acting out at the center as well. Lots of agitation. Depakote has been helping with the agitation. We can increase the medication if needed. He tries to get up, he gets up every night, wife redirects him. Some nights he sleeps well. Other nights he does not. He goes to be at 6-630 and I suggest he goes to sleep later instead. No significant snoring at night, not stopping breathing. Memory worsening. Better when he is at the adult center in the day.   Interval history 12/14/2015:  Wife found him, he was stiff. Eye closed. Arms folded over his chest, couldn;t pull them apart, fingers intertwined and folded on his chest. He was still stiff on the stretcher. Not talking. Eyes closed. No urination or defecation. Marland Kitchen He was confused all day Sunday. EEG showed nonspecific slowing. The rigidity lasted 5-10 minutes. He did not urinate or defecate. He was confused the rest of the day in the ED, he was not back to baseline. She thought he was dead. His lactate was elevated without other significant changes in BMP. Even EMS had a very difficult time putting him on the stricture due to his stiffness. CT of the head showed no acute processes or events.  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 He was last seen by Dr. Janann Colonel. He returns today for follow-up. The patient is currently taking Aricept 23 mg daily as well as Namenda XR 28 mg. The patient reports that his memory has gotten better. His wife reports that his memory has gotten worse. The patient continues to complete ADLs independently. His wife states that occasionally he will not put on clean clothes. She states that he tends to lay his clothes out for him. The patient no longer operates a motor vehicle. The wife states the patient had to give up his job due to his memory. Wife now completes all the finances. Patient was having some hallucinations. His wife states that occasionally during the night he will talk in his sleep but denies the patient may imbalance. She states occasionally if he gets up to go the bathroom during the night he may get confused. She is normally able to tell him to go back to sleep and the patient calms down. She reports that the patient continues to sleep a lot during the day. He is also seen by a urologist for BPH. She states currently they are checking the nerves in his bladder every week for 12 weeks. Other than this no new  medical issues since last seen.  Interval history 10/16/2015: Dr. Jaynee Eagles. I am seeing patient today for the first time. Things are going well per patient. But not per wife. He is still on Aricept. Memory continues to decline. Lots of confusion expressibe himself. Started around 2010 and has been progressively slowly worsening. 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 Wife had noticed changes at home even before that. Trouble with short term memory, trouble remembering conversations. He is not driving. Wife provides all information. He goes to a daycare center 5 days a week. Wife works 4 hours a day. They keep him very active and he is very happy there. He is wandering at night. She is able to redirect him into bed. During the day he  will get defiant and not want to put on the depends. Right now she feels like she is ok. No delusions or hallucinations. [facility] [address].   HISTORY 01/26/14 (SUMNER): Donald [name] is a 78 y.o. male here as a follow up from Dr. Shana ChuteSpruill for cognitive decline with prior visit on 10/2013. At that visit his Aricept was increased to 23mg . Minimal to no improvement noted with this. Continues to have visual and auditory hallucinations but wife notes this has decreased slightly since last visit. Wife notes that he can be easily redirected when he is having hallucinations. She notes that he sleeps a lot during the day, will watch a lot of TV. Notes he is not as active as he used to be. He is not currently driving. No difficulty eating, no choking. Wife notes he has been losing some weight, appetite is decreased. Has strong family history of memory loss.   Initial visit 05/2013: Started noticing around 4 years ago, has been getting progressively worse, increased decline in the past yar. Trouble with short term memory, trouble remembering conversations. Wife manages the finances, this changed 3  years ago. Continues driving, short distances. Wife has some concerns about his driving.No episodes of getting lost. Has good remote memory. No history of strokes or TIAs. Has sister with dementia. No tobacco or EtOH. 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 Has some nighttime agitation. No hallucinations.   Aricept and Namenda started around 3 to 4 years ago. Wife has not noticed much benefit.   Has had multiple syncopal episodes, following with Dr Shana ChuteSpruill, adjusting his blood pressure medication. Gets very diaphoretic during these episodes. No extremity shaking.   REVIEW OF SYSTEMS: Out of a complete 14 system review of symptoms, the patient complains only of the following symptoms, appetite change, fatigue, unexpected weight change, hearing loss, cough, snoring, walking difficulty, speech difficulty, weakness, agitation, behavioral problem, confusion and all other reviewed systems are negative.  ALLERGIES: No Known Allergies  HOME MEDICATIONS: Outpatient Medications Prior to Visit  Medication Sig Dispense Refill   amLODipine (NORVASC) 10 MG tablet Take 10 mg by mouth daily.      [facility] SENNA PLUS 8.6-50 MG tablet Take 2 tablets by mouth daily as needed for constipation.  3   donepezil (ARICEPT) 23 MG TABS tablet TAKE 1 TABLET BY MOUTH AT BEDTIME 30 tablet 11   lisinopril (ZESTRIL) 40 MG tablet Take 40 mg by mouth daily.     QUEtiapine (SEROQUEL) 25 MG tablet Take 1 tablet (25 mg total) by mouth at bedtime. 30 tablet 5   mirtazapine (REMERON) 30 MG tablet 1 TABLET BEFORE BEDTIME IN THE EVENING ONCE A DAY ORALLY 30     losartan (COZAAR) 100 MG tablet Take 100 mg daily by mouth.     sertraline (ZOLOFT) 20 MG/ML concentrated solution Take 1.3 mLs (26 mg total) by mouth daily for 30 days. 120 mL 0   Valproate Sodium (VALPROIC ACID) 250 MG/5ML SOLN Take 10 mLs 2 (two) times daily by mouth.     No facility-administered medications prior to  visit.     PAST MEDICAL HISTORY: Past Medical History:  Diagnosis Date   Dementia (HCC)    Hypertension     PAST SURGICAL HISTORY: Past Surgical History:  Procedure Laterality Date   NO PAST SURGERIES      FAMILY HISTORY: Family History  Problem Relation Age of Onset  Hypertension Mother    Dementia Sister     SOCIAL HISTORY: Social History   Socioeconomic History   Marital status: Married    Spouse name: mary   Number of children: 2   Years of education: college   Highest education level: Not on file  Occupational History   Occupation: retired  Ecologistocial Needs   Financial resource strain: Not on file   Food insecurity    Worry: Not on file    Inability: Not on Occupational hygienistfile   Transportation needs    Medical: Not on file    Non-medical: Not on file  Tobacco Use   Smoking status: Never Smoker   Smokeless tobacco: Never Used  Substance and Sexual Activity   Alcohol use: No    Alcohol/week: 0.0 standard drinks   Drug use: No   Sexual activity: Not on file  Lifestyle   Physical activity    Days per week: Not on file    Minutes per session: Not on file   Stress: Not on file  Relationships   Social connections    Talks on phone: Not on file    Gets together: Not on file    Attends religious service: Not on file    Active member of club or organization: Not on file    Attends meetings of clubs or organizations: Not on file    Relationship status: Not on file   Intimate partner violence    Fear of current or ex partner: Not on file    Emotionally abused: Not on file    Physically abused: Not on file    Forced sexual activity: Not on file  Other Topics Concern   Not on file  Social History Narrative   Patient is married Donald Dandy([name]), has 2 children.  Goes to day center.    Patient is right handed   Education level is college   Drinks about 1 cup of coffee daily       PHYSICAL EXAM  Vitals:   02/03/19 1009  BP: 126/78  Pulse: 68  Temp:  98.4 F (36.9 C)  Weight: 154 lb (69.9 kg)  Height: 5\' 11"  (1.803 m)   Body mass index is 21.48 kg/m.  Generalized: Well developed, in no acute distress  Cardiology: normal rate and rhythm, no murmur noted Neurological examination  Mentation: nonverbal, unable to assess orientation Cranial nerve II-XII: Pupils were equal round reactive to light. Unable to assess as patient follows limited commands  Motor: The motor testing reveals 5 over 5 strength of all 4 extremities. Good symmetric motor tone is noted throughout.  Sensory: unable to assess Coordination: unable to assess, patient does not follow commands Gait and station: Gait is wide and guarded, no assistive device used     DIAGNOSTIC DATA (LABS, IMAGING, TESTING) - I reviewed patient records, labs, notes, testing and imaging myself where available.  MMSE - Mini Mental State Exam 02/03/2019 09/22/2017 09/18/2016  Not completed: - Unable to complete -  Orientation to time 0 - 0  Orientation to Place 0 - 0  Registration 0 - 3  Attention/ Calculation 0 - 0  Recall 0 - 0  Language- name 2 objects 0 - 1  Language- repeat 0 - 0  Language- follow 3 step command 1 - 1  Language- read & follow direction 0 - 1  Write a sentence 0 - 0  Copy design 0 - 0  Total score 1 - 6     Lab Results  Component  Value Date   WBC 8.1 09/18/2018   HGB 14.7 09/18/2018   HCT 49.8 09/18/2018   MCV 91.7 09/18/2018   PLT 203 09/18/2018      Component Value Date/Time   NA 142 09/18/2018 1733   NA 145 (H) 09/18/2016 1517   K 4.3 09/18/2018 1733   CL 109 09/18/2018 1733   CO2 18 (L) 09/18/2018 1733   GLUCOSE 118 (H) 09/18/2018 1733   BUN 24 (H) 09/18/2018 1733   BUN 15 09/18/2016 1517   CREATININE 1.69 (H) 09/18/2018 1733   CALCIUM 8.7 (L) 09/18/2018 1733   PROT 7.9 05/13/2017 2006   PROT 7.8 09/18/2016 1517   ALBUMIN 3.2 (L) 05/13/2017 2006   ALBUMIN 3.9 09/18/2016 1517   AST 29 05/13/2017 2006   ALT 16 (L) 05/13/2017 2006   ALKPHOS 38  05/13/2017 2006   BILITOT 0.4 05/13/2017 2006   BILITOT 0.2 09/18/2016 1517   GFRNONAA 38 (L) 09/18/2018 1733   GFRAA 44 (L) 09/18/2018 1733   No results found for: CHOL, HDL, LDLCALC, LDLDIRECT, TRIG, CHOLHDL No results found for: GEXB2W Lab Results  Component Value Date   VITAMINB12 836 05/17/2013   Lab Results  Component Value Date   TSH 3.020 05/17/2013       ASSESSMENT AND PLAN 78 y.o. year old male  has a past medical history of Dementia (HCC) and Hypertension. here with     ICD-10-CM   1. 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  Seroquel, prescribed at his last visit, seem to make agitation worse.  We will wean him off of this medication.  She was advised to give him one half of a tablet nightly for the next week and then stop medication.  We will start hydroxyzine 25 mg every night at bedtime.  We have discussed potential side effects of this medication including sleepiness and grogginess.  I have advised that she may use this up to twice a day if needed.  We have had a lengthy discussion regarding caregiver burnout.  I have advised that she consider alternative living arrangements.  I have given her a dementia packet with community resources.  I have also provided education on her AVS.  I have encouraged her to reach out to me with any concerns or questions.  We will follow-up with Gelene Mink in 3 months.  [name] verbalizes understanding and agreement with this plan.   No orders of the defined types were placed in this encounter.    Meds ordered this encounter  Medications   hydrOXYzine (ATARAX/VISTARIL) 25 MG tablet    Sig: Take 1 tablet (25 mg total) by mouth 2 (two) times daily as needed.    Dispense:  30 tablet    Refill:  5    Order Specific Question:   Supervising Provider    Answer:   Anson Fret [id number]      I spent 15 minutes with the patient.  50% of this time was spent counseling and educating patient on plan of care and medications.    Shawnie Dapper, FNP-C 02/03/2019, 2:33 PM [name] [address], [address] [place], [place] [address] [phone]

## 2019-02-03 NOTE — Patient Instructions (Signed)
Take 1/2 tablet of Seroquel for 1 week then discontinue  Start hydroxyzine 25mg  daily at bedtime (may give twice daily if needed)  Dementia packet given, review resources, call with any questions    Dementia Caregiver Guide Dementia is a term used to describe a number of symptoms that affect memory and thinking. The most common symptoms include:  Memory loss.  Trouble with language and communication.  Trouble concentrating.  Poor judgment.  Problems with reasoning.  Child-like behavior and language.  Extreme anxiety.  Angry outbursts.  Wandering from home or public places. Dementia usually gets worse slowly over time. In the early stages, people with dementia can stay independent and safe with some help. In later stages, they need help with daily tasks such as dressing, grooming, and using the bathroom. How to help the person with dementia cope Dementia can be frightening and confusing. Here are some tips to help the person with dementia cope with changes caused by the disease. General tips  Keep the person on track with his or her routine.  Try to identify areas where the person may need help.  Be supportive, patient, calm, and encouraging.  Gently remind the person that adjusting to changes takes time.  Help with the tasks that the person has asked for help with.  Keep the person involved in daily tasks and decisions as much as possible.  Encourage conversation, but try not to get frustrated or harried if the person struggles to find words or does not seem to appreciate your help. Communication tips  When the person is talking or seems frustrated, make eye contact and hold the person's hand.  Ask specific questions that need yes or no answers.  Use simple words, short sentences, and a calm voice. Only give one direction at a time.  When offering choices, limit them to just 1 or 2.  Avoid correcting the person in a negative way.  If the person is struggling  to find the right words, gently try to help him or her. How to recognize symptoms of stress Symptoms of stress in caregivers include:  Feeling frustrated or angry with the person with dementia.  Denying that the person has dementia or that his or her symptoms will not improve.  Feeling hopeless and unappreciated.  Difficulty sleeping.  Difficulty concentrating.  Feeling anxious, irritable, or depressed.  Developing stress-related health problems.  Feeling like you have too little time for your own life. Follow these instructions at home:   Make sure that you and the person you are caring for: ? Get regular sleep. ? Exercise regularly. ? Eat regular, nutritious meals. ? Drink enough fluid to keep your urine clear or pale yellow. ? Take over-the-counter and prescription medicines only as told by your health care providers. ? Attend all scheduled health care appointments.  Join a support group with others who are caregivers.  Ask about respite care resources so that you can have a regular break from the stress of caregiving.  Look for signs of stress in yourself and in the person you are caring for. If you notice signs of stress, take steps to manage it.  Consider any safety risks and take steps to avoid them.  Organize medications in a pill box for each day of the week.  Create a plan to handle any legal or financial matters. Get legal or financial advice if needed.  Keep a calendar in a central location to remind the person of appointments or other activities. Tips for reducing  the risk of injury  Keep floors clear of clutter. Remove rugs, magazine racks, and floor lamps.  Keep hallways well lit, especially at night.  Put a handrail and nonslip mat in the bathtub or shower.  Put childproof locks on cabinets that contain dangerous items, such as medicines, alcohol, guns, toxic cleaning items, sharp tools or utensils, matches, and lighters.  Put the locks in places  where the person cannot see or reach them easily. This will help ensure that the person does not wander out of the house and get lost.  Be prepared for emergencies. Keep a list of emergency phone numbers and addresses in a convenient area.  Remove car keys and lock garage doors so that the person does not try to get in the car and drive.  Have the person wear a bracelet that tracks locations and identifies the person as having memory problems. This should be worn at all times for safety. Where to find support: Many individuals and organizations offer support. These include:  Support groups for people with dementia and for caregivers.  Counselors or therapists.  Home health care services.  Adult day care centers. Where to find more information Alzheimer's Association: [employer] Contact a health care provider if:  The person's health is rapidly getting worse.  You are no longer able to care for the person.  Caring for the person is affecting your physical and emotional health.  The person threatens himself or herself, you, or anyone else. Summary  Dementia is a term used to describe a number of symptoms that affect memory and thinking.  Dementia usually gets worse slowly over time.  Take steps to reduce the person's risk of injury, and to plan for future care.  Caregivers need support, relief from caregiving, and time for their own lives. This information is not intended to replace advice given to you by your health care provider. Make sure you discuss any questions you have with your health care provider. Document Released: 06/25/2016 Document Revised: 07/04/2017 Document Reviewed: 06/25/2016 Elsevier Patient Education  2020 [employer].

## 2019-02-04 NOTE — Progress Notes (Signed)
Made any corrections needed, and agree with history, physical, neuro exam,assessment and plan as stated.     Rheana Casebolt, MD [name]  

## 2019-03-10 DIAGNOSIS — I10 Essential (primary) hypertension: Secondary | ICD-10-CM | POA: Diagnosis not present

## 2019-03-10 DIAGNOSIS — R945 Abnormal results of liver function studies: Secondary | ICD-10-CM | POA: Diagnosis not present

## 2019-03-10 DIAGNOSIS — F0391 Unspecified dementia with behavioral disturbance: Secondary | ICD-10-CM | POA: Diagnosis not present

## 2019-03-17 DIAGNOSIS — I10 Essential (primary) hypertension: Secondary | ICD-10-CM | POA: Diagnosis not present

## 2019-03-17 DIAGNOSIS — F0391 Unspecified dementia with behavioral disturbance: Secondary | ICD-10-CM | POA: Diagnosis not present

## 2019-03-17 DIAGNOSIS — Z7189 Other specified counseling: Secondary | ICD-10-CM | POA: Diagnosis not present

## 2019-03-17 DIAGNOSIS — R945 Abnormal results of liver function studies: Secondary | ICD-10-CM | POA: Diagnosis not present

## 2019-03-24 DIAGNOSIS — Z23 Encounter for immunization: Secondary | ICD-10-CM | POA: Diagnosis not present

## 2019-05-06 ENCOUNTER — Ambulatory Visit (INDEPENDENT_AMBULATORY_CARE_PROVIDER_SITE_OTHER): Payer: Medicare Other | Admitting: Family Medicine

## 2019-05-06 ENCOUNTER — Other Ambulatory Visit: Payer: Self-pay

## 2019-05-06 ENCOUNTER — Encounter: Payer: Self-pay | Admitting: Family Medicine

## 2019-05-06 VITALS — BP 120/70 | HR 60 | Temp 97.1°F | Ht 71.0 in | Wt 155.6 lb

## 2019-05-06 DIAGNOSIS — F0280 Dementia in other diseases classified elsewhere without behavioral disturbance: Secondary | ICD-10-CM | POA: Diagnosis not present

## 2019-05-06 DIAGNOSIS — G309 Alzheimer's disease, unspecified: Secondary | ICD-10-CM

## 2019-05-06 NOTE — Progress Notes (Signed)
PATIENT: Donald [name] DOB: 06/16/1941  REASON FOR VISIT: follow up HISTORY FROM: patient  Chief Complaint  Patient presents with   Follow-up    3 mon f/u. Wife present. Rm 1. Patient's wife mentioned that he has been sleeping alot, not talking and when does talk it's slurred speech. She mentioned that he has become combative and that he only eats breakfast and lunch. Not much liquid intake.      HISTORY OF PRESENT ILLNESS: Today 05/06/19 Donald [name] is a 78 y.o. male here today for follow up of progressive dementia.  His wife reports that he continues to decline.  He remains agitated and combative at times.  We have tried multiple medications including Seroquel and Lamictal in the past with adverse effects.  Sertraline was not beneficial.  We did prescribe hydroxyzine at last visit.  She feels that this does help him rest at night.  He is mostly nonverbal.  He will occasionally shake his head or give one-word answers but seem appropriate.  He is requiring assistance with all ADLs.  He does seem to be taking shorter steps.  No falls.  He continues to go to dementia center during the day.  Weight has remained stable since last visit.  There are times where he eats well and others that he refuses.  She does mention that she is having a harder time getting him to her.  She has reviewed community resources given at last visit.  She is still considering options.  She would like to pursue a psychiatry referral for evaluation and treatment of behavioral changes. No stroke like symptoms noted.   HISTORY: (copied from my note on 02/03/2019)  Donald [name] is a 78 y.o. male here today for follow up of dementia. We started Seroquel  for increased restlessness and agitation at his last visit in 12/2018.  His wife, Donald [name], reports that this medication did not help at all.  In fact, she feels like he may be a little more agitated now.  She reports that he has  recently gone back to the dementia center.  He is usually there from 8 AM to 4 PM.  She has noticed that his memory is much worse.  There are more times than not that he does not know who she is.  He is mostly nonverbal.  He will occasionally nod appropriately.  He is aggressive at times.  She is trying to maintain his care at home.  She has limited support.  She does have her daughter that comes over to visit but is not well herself.  Donald [name] is beginning to think about other options for living arrangements.  She denies any injury.  She feels that she is safe in her environment.  HISTORY: (copied from my note on 10/13/2018)  Interval history 10/13/2018: He is here today with his wife, Donald [name], who aids in history. She reports that he is more combative at nights. She is unsure about hallucinations. He does occasionally point his finger in the air. He is unable to dress or bath himself. He is incontinent. He is with an adult memory center during the day and stays at home with [facility] at night. He is eating well. He drinks mostly water. No trouble swallowing. Weight stable. He is mostly nonverbal. Will nod occasionally. Donald [name] reports having a good support system with her brother who lives across the street. Her daughter helps. Her granddaughter is a Engineer, civil (consulting) and comes over frequently.   He is  taking Aricept 23mg  daily. He could not tolerate Namenda. He is taking 250mg  (69ml), previously taking 500mg , of Valproic Acid due to recent liver enzyme elevation. His PCP is monitoring labs closely. Donald [name] reports that she gave him clonazepam and sertraline as prescribed last year but he was so sedated she did not feel comfortable continuing. He has not taken either medication recently.   Interval history 09/22/2017: Very combative in the afternoons. No hallucinations. He is doing well during the day. He has very few good days. He goes every day. He goes 5 days a week 730-415pm. He eats supper and afterwards she tries to get him  changed. He will swing at her. On the weekends, he will have breakfast and he will go back to bed until 5pm and then he goes back to bed. There is considerable agitation and patient is aggressive and physical. There is also caregiver burnout. Discussed this with wife and ways to decrease stress. Recommend a memory unit ultimately, wife will have difficulty affording.  Interval history 03/18/2016:More agitated, sleep walking. He naps during the day then He is very sleepy during the day, he will swing at her if she tries to stop him from sleeping. He is going to an adult daycare. Not aside effect of the medication. He is acting out at the center as well. Lots of agitation. Depakote has been helping with the agitation. We can increase the medication if needed. He tries to get up, he gets up every night, wife redirects him. Some nights he sleeps well. Other nights he does not. He goes to be at 6-630 and I suggest he goes to sleep later instead. No significant snoring at night, not stopping breathing. Memory worsening. Better when he is at the adult center in the day.   Interval history 12/14/2015: Wife found him, he was stiff. Eye closed. Arms folded over his chest, couldn;t pull them apart, fingers intertwined and folded on his chest. He was still stiff on the stretcher. Not talking. Eyes closed. No urination or defecation. Marland Kitchen He was confused all day Sunday. EEG showed nonspecific slowing. The rigidity lasted 5-10 minutes. He did not urinate or defecate. He was confused the rest of the day in the ED, he was not back to baseline. She thought he was dead. His lactate was elevated without other significant changes in BMP. Even EMS had a very difficult time putting him on the stricture due to his stiffness. CT of the head showed no acute processes or events.  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 He was last seen by Dr.  Janann Colonel. He returns today for follow-up. The patient is currently taking Aricept 23 mg daily as well as Namenda XR 28 mg. The patient reports that his memory has gotten better. His wife reports that his memory has gotten worse. The patient continues to complete ADLs independently. His wife states that occasionally he will not put on clean clothes. She states that he tends to lay his clothes out for him. The patient no longer operates a motor vehicle. The wife states the patient had to give up his job due to his memory. Wife now completes all the finances. Patient was having some hallucinations. His wife states that occasionally during the night he will talk in his sleep but denies the patient may imbalance. She states occasionally if he gets up to go the bathroom during the night he may get confused. She is  normally able to tell him to go back to sleep and the patient calms down. She reports that the patient continues to sleep a lot during the day. He is also seen by a urologist for BPH. She states currently they are checking the nerves in his bladder every week for 12 weeks. Other than this no new medical issues since last seen.  Interval history 10/16/2015: Dr. Lucia Gaskins. I am seeing patient today for the first time. Things are going well per patient. But not per wife. He is still on Aricept. Memory continues to decline. Lots of confusion expressibe himself. Started around 2010 and has been progressively slowly worsening. 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 Wife had noticed changes at home even before that. Trouble with short term memory, trouble remembering conversations. He is not driving. Wife provides all information. He goes to a daycare center 5 days a week. Wife works 4 hours a day. They keep him very active and he is very happy there. He is wandering at night. She is able to redirect him into bed. During the day he will get defiant and not want to put  on the depends. Right now she feels like she is ok. No delusions or hallucinations. [facility] [address].   HISTORY 01/26/14 ([place]): Donald [name] is a 78 y.o. male here as a follow up from Dr. Shana Chute for cognitive decline with prior visit on 10/2013. At that visit his Aricept was increased to 23mg . Minimal to no improvement noted with this. Continues to have visual and auditory hallucinations but wife notes this has decreased slightly since last visit. Wife notes that he can be easily redirected when he is having hallucinations. She notes that he sleeps a lot during the day, will watch a lot of TV. Notes he is not as active as he used to be. He is not currently driving. No difficulty eating, no choking. Wife notes he has been losing some weight, appetite is decreased. Has strong family history of memory loss.   Initial visit 05/2013: Started noticing around 4 years ago, has been getting progressively worse, increased decline in the past yar. Trouble with short term memory, trouble remembering conversations. Wife manages the finances, this changed 3 years ago. Continues driving, short distances. Wife has some concerns about his driving.No episodes of getting lost. Has good remote memory. No history of strokes or TIAs. Has sister with dementia. No tobacco or EtOH. Works as an 06/2013, stopped this past year as his bosses said he was not remembering. Has some nighttime agitation. No hallucinations.   Aricept and Namenda started around 3 to 4 years ago. Wife has not noticed much benefit.   Has had multiple syncopal episodes, following with Dr Technical sales engineer, adjusting his blood pressure medication. Gets very diaphoretic during these episodes. No extremity shaking.     REVIEW OF SYSTEMS: Out of a complete 14 system review of symptoms, the patient complains only of the following symptoms, memory loss, behavioral changes and all other reviewed systems are  negative.  ALLERGIES: No Known Allergies  HOME MEDICATIONS: Outpatient Medications Prior to Visit  Medication Sig Dispense Refill   amLODipine (NORVASC) 10 MG tablet Take 10 mg by mouth daily.      [facility] SENNA PLUS 8.6-50 MG tablet Take 2 tablets by mouth daily as needed for constipation.  3   donepezil (ARICEPT) 23 MG TABS tablet TAKE 1 TABLET BY MOUTH  AT BEDTIME 30 tablet 11   hydrOXYzine (ATARAX/VISTARIL) 25 MG tablet Take 1 tablet (25 mg total) by mouth 2 (two) times daily as needed. 30 tablet 5   lisinopril (ZESTRIL) 40 MG tablet Take 40 mg by mouth daily.     QUEtiapine (SEROQUEL) 25 MG tablet Take 1 tablet (25 mg total) by mouth at bedtime. 30 tablet 5   No facility-administered medications prior to visit.     PAST MEDICAL HISTORY: Past Medical History:  Diagnosis Date   Dementia (HCC)    Hypertension     PAST SURGICAL HISTORY: Past Surgical History:  Procedure Laterality Date   NO PAST SURGERIES      FAMILY HISTORY: Family History  Problem Relation Age of Onset   Hypertension Mother    Dementia Sister     SOCIAL HISTORY: Social History   Socioeconomic History   Marital status: Married    Spouse name: Donald [name]   Number of children: 2   Years of education: college   Highest education level: Not on file  Occupational History   Occupation: retired  Ecologistocial Needs   Financial resource strain: Not on file   Food insecurity    Worry: Not on file    Inability: Not on Occupational hygienistfile   Transportation needs    Medical: Not on file    Non-medical: Not on file  Tobacco Use   Smoking status: Never Smoker   Smokeless tobacco: Never Used  Substance and Sexual Activity   Alcohol use: No    Alcohol/week: 0.0 standard drinks   Drug use: No   Sexual activity: Not on file  Lifestyle   Physical activity    Days per week: Not on file    Minutes per session: Not on file   Stress: Not on file  Relationships   Social connections    Talks on phone: Not on  file    Gets together: Not on file    Attends religious service: Not on file    Active member of club or organization: Not on file    Attends meetings of clubs or organizations: Not on file    Relationship status: Not on file   Intimate partner violence    Fear of current or ex partner: Not on file    Emotionally abused: Not on file    Physically abused: Not on file    Forced sexual activity: Not on file  Other Topics Concern   Not on file  Social History Narrative   Patient is married Donald [name](Donald [name]), has 2 children.  Goes to day center.    Patient is right handed   Education level is college   Drinks about 1 cup of coffee daily       PHYSICAL EXAM  Vitals:   05/06/19 0913  BP: 120/70  Pulse: 60  Temp: (!) 97.1 F (36.2 C)  TempSrc: Oral  Weight: 155 lb 9.6 oz (70.6 kg)  Height: 5\' 11"  (1.803 m)   Body mass index is 21.7 kg/m.  Generalized: Well developed, in no acute distress  Cardiology: normal rate and rhythm, no murmur noted Neurological examination  Mentation: Alert oriented to time, place, history taking. Follows all commands speech and language fluent Cranial nerve II-XII: Pupils were equal round reactive to light. Extraocular movements were full, visual field were full on confrontational test. Facial sensation and strength were normal. Uvula tongue midline. Head turning and shoulder shrug  were normal and symmetric. Motor: The motor testing reveals 5 over 5 strength of  all 4 extremities. Good symmetric motor tone is noted throughout.  Sensory: Sensory testing is intact to soft touch on all 4 extremities. No evidence of extinction is noted.  Coordination: Cerebellar testing reveals good finger-nose-finger and heel-to-shin bilaterally.  Gait and station: Gait is normal. Tandem gait is normal. Romberg is negative. No drift is seen.  Reflexes: Deep tendon reflexes are symmetric and normal bilaterally.   DIAGNOSTIC DATA (LABS, IMAGING, TESTING) - I reviewed patient  records, labs, notes, testing and imaging myself where available.  MMSE - Mini Mental State Exam 02/03/2019 09/22/2017 09/18/2016  Not completed: - Unable to complete -  Orientation to time 0 - 0  Orientation to Place 0 - 0  Registration 0 - 3  Attention/ Calculation 0 - 0  Recall 0 - 0  Language- name 2 objects 0 - 1  Language- repeat 0 - 0  Language- follow 3 step command 1 - 1  Language- read & follow direction 0 - 1  Write a sentence 0 - 0  Copy design 0 - 0  Total score 1 - 6     Lab Results  Component Value Date   WBC 8.1 09/18/2018   HGB 14.7 09/18/2018   HCT 49.8 09/18/2018   MCV 91.7 09/18/2018   PLT 203 09/18/2018      Component Value Date/Time   NA 142 09/18/2018 1733   NA 145 (H) 09/18/2016 1517   K 4.3 09/18/2018 1733   CL 109 09/18/2018 1733   CO2 18 (L) 09/18/2018 1733   GLUCOSE 118 (H) 09/18/2018 1733   BUN 24 (H) 09/18/2018 1733   BUN 15 09/18/2016 1517   CREATININE 1.69 (H) 09/18/2018 1733   CALCIUM 8.7 (L) 09/18/2018 1733   PROT 7.9 05/13/2017 2006   PROT 7.8 09/18/2016 1517   ALBUMIN 3.2 (L) 05/13/2017 2006   ALBUMIN 3.9 09/18/2016 1517   AST 29 05/13/2017 2006   ALT 16 (L) 05/13/2017 2006   ALKPHOS 38 05/13/2017 2006   BILITOT 0.4 05/13/2017 2006   BILITOT 0.2 09/18/2016 1517   GFRNONAA 38 (L) 09/18/2018 1733   GFRAA 44 (L) 09/18/2018 1733   No results found for: CHOL, HDL, LDLCALC, LDLDIRECT, TRIG, CHOLHDL No results found for: ZOXW9U Lab Results  Component Value Date   VITAMINB12 836 05/17/2013   Lab Results  Component Value Date   TSH 3.020 05/17/2013       ASSESSMENT AND PLAN 78 y.o. year old male  has a past medical history of Dementia (HCC) and Hypertension. here with     ICD-10-CM   1. 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  Donald [name] reports that behavioral concerns continue to be an issue for her.  She has been notified  by the dementia center that agitation and combativeness is occasionally a problem.  We have tried multiple medications in the past with no benefit.  I will place a referral for geriatric psychiatry per [facility] request.  We have also discussed progression of dementia.  Community resources reviewed.  He will return for follow-up in 3 months.  Donald [name] verbalizes understanding and agreement with this plan.   Orders Placed This Encounter  Procedures   Ambulatory referral to Psychiatry    Referral Priority:   Routine    Referral Type:   Psychiatric    Referral Reason:   Specialty Services Required    Requested Specialty:   Psychiatry  Number of Visits Requested:   1     No orders of the defined types were placed in this encounter.     I spent 15 minutes with the patient. 50% of this time was spent counseling and educating patient on plan of care and medications.    Shawnie Dapper, FNP-C 05/06/2019, 12:45 PM [name] [address] [place], [place] [address] [phone]

## 2019-05-06 NOTE — Patient Instructions (Signed)
We will continue current treatment  I will place a referral for geriatric psychiatrist  Follow up in 3 months  Alzheimer Disease Caregiver Guide  Alzheimer disease causes a person to lose the ability to remember things and make decisions. A person who has Alzheimer disease may not be able to take care of himself or herself. He or she may need help with simple tasks. The tips below can help you care for the person. What kind of changes does this condition cause? This condition makes a person:  Forget things.  Feel confused.  Act differently.  Have different moods. These things get worse with time. Tips to help with symptoms  Be calm and patient.  Respond with a simple, short answer.  Avoid correcting the person in a negative way.  Try not to take things personally, even if the person forgets your name.  Do not argue with the person. This may make the person more upset. Tips to lessen frustration  Make appointments and do daily tasks when the person is at his or her best.  Take your time. Simple tasks may take longer. Allow plenty of time to complete tasks.  Limit choices for the person.  Involve the person in what you are doing.  Keep a daily routine.  Avoid new or crowded places, if possible.  Use simple words, short sentences, and a calm voice. Only give one direction at a time.  Buy clothes and shoes that are easy to put on and take off.  Organize medicines in a pillbox for each day of the week.  Keep a calendar in a central location to remind the person of meetings or other activities.  Let people help if they offer. Take a break when needed. Tips to prevent injury  Keep floors clear. Remove rugs, magazine racks, and floor lamps.  Keep hallways well-lit.  Put a handrail and non-slip mat in the bathtub or shower.  Put childproof locks on cabinets that have dangerous items in them. These items include medicine, alcohol, guns, toxic cleaning items, sharp  tools, matches, and lighters.  Put locks on doors where the person cannot see or reach them. This helps the person to not wander out of the house and get lost.  Be prepared for emergencies. Keep a list of emergency phone numbers and addresses close by.  Bracelets may be worn that track location and identify the person as having memory problems. This should be worn at all times for safety. Tips for the future  Discuss financial and legal planning early. People with this disease have trouble managing their money as the disease gets worse. Get help from a professional.  Talk about advance directives, safety, and daily care. Take these steps: ? Create a living will and choose a power of attorney. This is someone who can make decisions for the person with Alzheimer disease when he or she can no longer do so. ? Discuss driving safety and when to stop driving. The person's doctor can help with this. ? If the person lives alone, make sure he or she is safe. Some people need extra help at home. Other people need more care at a nursing home or care center. Where to find support You can find support by joining a support group near you. Some benefits of joining a support group include:  Learning ways to manage stress.  Sharing experiences with others.  Getting emotional comfort and support.  Learning about caregiving as the disease progresses.  Knowing what community  resources are available and making use of them. Where to find more information  Alzheimer's Association: [employer] Contact a doctor if:  The person has a fever.  The person has a sudden behavior change that does not get better with calming strategies.  The person is not able to take care of himself or herself at home.  The person threatens you or anyone else, including himself or herself.  You are no longer able to care for the person. Summary  Alzheimer disease causes a person to forget things and to be confused.  A  person who has this condition may not be able to take care of himself or herself.  Take steps to keep the person from getting hurt. Plan for future care.  You can find support by joining a support group near you. This information is not intended to replace advice given to you by your health care provider. Make sure you discuss any questions you have with your health care provider. Document Released: 10/14/2011 Document Revised: 11/10/2018 Document Reviewed: 07/17/2017 Elsevier Patient Education  2020 [employer].

## 2019-05-07 NOTE — Progress Notes (Signed)
Made any corrections needed, and agree with history, physical, neuro exam,assessment and plan as stated.     Abdullah Rizzi, MD [name]  

## 2019-05-11 ENCOUNTER — Telehealth: Payer: Self-pay | Admitting: Family Medicine

## 2019-05-11 NOTE — Telephone Encounter (Signed)
Pt's wife called wanting to speak to RN or provider about the referral that was given to the pt. She called the office and they informed her that they are not taking any new pt's and she would like to know if there is another provider that pt can be referred to. Please advise.

## 2019-05-13 NOTE — Telephone Encounter (Signed)
I have sent referral to [facility] . Patient's wife is ware .

## 2019-05-18 ENCOUNTER — Other Ambulatory Visit: Payer: Self-pay

## 2019-05-18 DIAGNOSIS — F0280 Dementia in other diseases classified elsewhere without behavioral disturbance: Secondary | ICD-10-CM

## 2019-05-18 MED ORDER — HYDROXYZINE HCL 25 MG PO TABS
25.0000 mg | ORAL_TABLET | Freq: Two times a day (BID) | ORAL | 0 refills | Status: DC | PRN
Start: 2019-05-18 — End: 2019-08-12

## 2019-06-02 DIAGNOSIS — F0391 Unspecified dementia with behavioral disturbance: Secondary | ICD-10-CM | POA: Diagnosis not present

## 2019-06-02 DIAGNOSIS — I10 Essential (primary) hypertension: Secondary | ICD-10-CM | POA: Diagnosis not present

## 2019-06-02 DIAGNOSIS — R109 Unspecified abdominal pain: Secondary | ICD-10-CM | POA: Diagnosis not present

## 2019-06-29 DIAGNOSIS — F0390 Unspecified dementia without behavioral disturbance: Secondary | ICD-10-CM | POA: Diagnosis not present

## 2019-07-08 NOTE — Telephone Encounter (Signed)
[facility] was unable to take referral I have called and left patient's wife a message asking her to call me back.

## 2019-07-14 DIAGNOSIS — I10 Essential (primary) hypertension: Secondary | ICD-10-CM | POA: Diagnosis not present

## 2019-07-14 DIAGNOSIS — R945 Abnormal results of liver function studies: Secondary | ICD-10-CM | POA: Diagnosis not present

## 2019-07-15 NOTE — Telephone Encounter (Signed)
Patient's wife never called me back I am going to try to send patient to Dr. Toy Care and see if he can help him. Telephone [phone] fax [phone]

## 2019-07-27 DIAGNOSIS — N289 Disorder of kidney and ureter, unspecified: Secondary | ICD-10-CM | POA: Diagnosis not present

## 2019-07-27 DIAGNOSIS — F0390 Unspecified dementia without behavioral disturbance: Secondary | ICD-10-CM | POA: Diagnosis not present

## 2019-07-27 DIAGNOSIS — I10 Essential (primary) hypertension: Secondary | ICD-10-CM | POA: Diagnosis not present

## 2019-08-09 ENCOUNTER — Encounter (HOSPITAL_COMMUNITY): Payer: Self-pay | Admitting: Emergency Medicine

## 2019-08-09 ENCOUNTER — Inpatient Hospital Stay (HOSPITAL_COMMUNITY)
Admission: EM | Admit: 2019-08-09 | Discharge: 2019-08-12 | DRG: 177 | Disposition: A | Discharge status: Home Health | Payer: Medicare Other | Attending: Internal Medicine | Admitting: Internal Medicine

## 2019-08-09 ENCOUNTER — Other Ambulatory Visit: Payer: Self-pay

## 2019-08-09 ENCOUNTER — Emergency Department (HOSPITAL_COMMUNITY): Payer: Medicare Other

## 2019-08-09 DIAGNOSIS — Z8249 Family history of ischemic heart disease and other diseases of the circulatory system: Secondary | ICD-10-CM

## 2019-08-09 DIAGNOSIS — F0390 Unspecified dementia without behavioral disturbance: Secondary | ICD-10-CM | POA: Diagnosis not present

## 2019-08-09 DIAGNOSIS — R55 Syncope and collapse: Secondary | ICD-10-CM | POA: Diagnosis present

## 2019-08-09 DIAGNOSIS — U071 COVID-19: Principal | ICD-10-CM | POA: Diagnosis present

## 2019-08-09 DIAGNOSIS — R778 Other specified abnormalities of plasma proteins: Secondary | ICD-10-CM | POA: Diagnosis not present

## 2019-08-09 DIAGNOSIS — G309 Alzheimer's disease, unspecified: Secondary | ICD-10-CM | POA: Diagnosis not present

## 2019-08-09 DIAGNOSIS — N179 Acute kidney failure, unspecified: Secondary | ICD-10-CM | POA: Diagnosis not present

## 2019-08-09 DIAGNOSIS — F0280 Dementia in other diseases classified elsewhere without behavioral disturbance: Secondary | ICD-10-CM | POA: Diagnosis present

## 2019-08-09 DIAGNOSIS — I959 Hypotension, unspecified: Secondary | ICD-10-CM | POA: Diagnosis not present

## 2019-08-09 DIAGNOSIS — R7989 Other specified abnormal findings of blood chemistry: Secondary | ICD-10-CM | POA: Diagnosis not present

## 2019-08-09 DIAGNOSIS — Z66 Do not resuscitate: Secondary | ICD-10-CM | POA: Diagnosis not present

## 2019-08-09 DIAGNOSIS — I459 Conduction disorder, unspecified: Secondary | ICD-10-CM | POA: Diagnosis present

## 2019-08-09 DIAGNOSIS — R0902 Hypoxemia: Secondary | ICD-10-CM | POA: Diagnosis not present

## 2019-08-09 DIAGNOSIS — R410 Disorientation, unspecified: Secondary | ICD-10-CM | POA: Diagnosis not present

## 2019-08-09 DIAGNOSIS — E860 Dehydration: Secondary | ICD-10-CM | POA: Diagnosis present

## 2019-08-09 DIAGNOSIS — R404 Transient alteration of awareness: Secondary | ICD-10-CM | POA: Diagnosis not present

## 2019-08-09 DIAGNOSIS — R001 Bradycardia, unspecified: Secondary | ICD-10-CM | POA: Diagnosis present

## 2019-08-09 DIAGNOSIS — I10 Essential (primary) hypertension: Secondary | ICD-10-CM | POA: Diagnosis present

## 2019-08-09 DIAGNOSIS — J9601 Acute respiratory failure with hypoxia: Secondary | ICD-10-CM | POA: Diagnosis present

## 2019-08-09 LAB — CBC WITH DIFFERENTIAL/PLATELET
Abs Immature Granulocytes: 0.03 10*3/uL (ref 0.00–0.07)
Basophils Absolute: 0.1 10*3/uL (ref 0.0–0.1)
Basophils Relative: 1 %
Eosinophils Absolute: 0 10*3/uL (ref 0.0–0.5)
Eosinophils Relative: 0 %
HCT: 52.6 % — ABNORMAL HIGH (ref 39.0–52.0)
Hemoglobin: 15.4 g/dL (ref 13.0–17.0)
Immature Granulocytes: 0 %
Lymphocytes Relative: 15 %
Lymphs Abs: 1.2 10*3/uL (ref 0.7–4.0)
MCH: 26.1 pg (ref 26.0–34.0)
MCHC: 29.3 g/dL — ABNORMAL LOW (ref 30.0–36.0)
MCV: 89.2 fL (ref 80.0–100.0)
Monocytes Absolute: 1.1 10*3/uL — ABNORMAL HIGH (ref 0.1–1.0)
Monocytes Relative: 14 %
Neutro Abs: 5.6 10*3/uL (ref 1.7–7.7)
Neutrophils Relative %: 70 %
Platelets: 253 10*3/uL (ref 150–400)
RBC: 5.9 MIL/uL — ABNORMAL HIGH (ref 4.22–5.81)
RDW: 15.1 % (ref 11.5–15.5)
WBC: 8 10*3/uL (ref 4.0–10.5)
nRBC: 0 % (ref 0.0–0.2)

## 2019-08-09 LAB — CBG MONITORING, ED: Glucose-Capillary: 93 mg/dL (ref 70–99)

## 2019-08-09 LAB — COMPREHENSIVE METABOLIC PANEL
ALT: 33 U/L (ref 0–44)
AST: 40 U/L (ref 15–41)
Albumin: 3.7 g/dL (ref 3.5–5.0)
Alkaline Phosphatase: 52 U/L (ref 38–126)
Anion gap: 13 (ref 5–15)
BUN: 21 mg/dL (ref 8–23)
CO2: 20 mmol/L — ABNORMAL LOW (ref 22–32)
Calcium: 9 mg/dL (ref 8.9–10.3)
Chloride: 110 mmol/L (ref 98–111)
Creatinine, Ser: 1.95 mg/dL — ABNORMAL HIGH (ref 0.61–1.24)
GFR calc Af Amer: 37 mL/min — ABNORMAL LOW (ref >60)
GFR calc non Af Amer: 32 mL/min — ABNORMAL LOW (ref >60)
Glucose, Bld: 120 mg/dL — ABNORMAL HIGH (ref 70–99)
Potassium: 4.7 mmol/L (ref 3.5–5.1)
Sodium: 143 mmol/L (ref 135–145)
Total Bilirubin: 0.7 mg/dL (ref 0.3–1.2)
Total Protein: 8 g/dL (ref 6.5–8.1)

## 2019-08-09 LAB — RESPIRATORY PANEL BY RT PCR (FLU A&B, COVID)
Influenza A by PCR: NEGATIVE
Influenza B by PCR: NEGATIVE
SARS Coronavirus 2 by RT PCR: POSITIVE — AB

## 2019-08-09 LAB — PROTIME-INR
INR: 1 (ref 0.8–1.2)
Prothrombin Time: 13.5 seconds (ref 11.4–15.2)

## 2019-08-09 LAB — TROPONIN I (HIGH SENSITIVITY)
Troponin I (High Sensitivity): 41 ng/L — ABNORMAL HIGH (ref <18)
Troponin I (High Sensitivity): 43 ng/L — ABNORMAL HIGH (ref <18)

## 2019-08-09 MED ORDER — SODIUM CHLORIDE 0.9 % IV SOLN
INTRAVENOUS | Status: DC
  Administered 2019-08-10: 125 mL via INTRAVENOUS

## 2019-08-09 MED ORDER — ENOXAPARIN SODIUM 30 MG/0.3ML ~~LOC~~ SOLN: 30.0000 mg | SUBCUTANEOUS | Status: DC

## 2019-08-09 MED ORDER — PANTOPRAZOLE SODIUM 40 MG PO TBEC
40.0000 mg | DELAYED_RELEASE_TABLET | Freq: Every day | ORAL | Status: DC
  Administered 2019-08-09 – 2019-08-12 (×4): 40 mg via ORAL
  Filled 2019-08-09 (×4): qty 1

## 2019-08-09 MED ORDER — DONEPEZIL HCL 23 MG PO TABS
23.0000 mg | ORAL_TABLET | Freq: Every day | ORAL | Status: DC
  Administered 2019-08-10 – 2019-08-11 (×2): 23 mg via ORAL
  Filled 2019-08-09 (×4): qty 1

## 2019-08-09 MED ORDER — SODIUM CHLORIDE 0.9 % IV BOLUS
1000.0000 mL | Freq: Once | INTRAVENOUS | Status: AC
  Administered 2019-08-09: 1000 mL via INTRAVENOUS

## 2019-08-09 NOTE — ED Notes (Signed)
Donald [name] in main lab phlebotomy verbalizes will come to attempt lab draw.

## 2019-08-09 NOTE — ED Provider Notes (Signed)
[facility] DEPT Provider Note   CSN: [id number] Arrival date & time: 08/09/19  1150     History Chief Complaint  Patient presents with  . Loss of Consciousness    Donald [name] is a 79 y.o. male.  Pt presents to the ED today with a syncopal episode.  The pt has severe dementia and is unable to give any hx.  Hx obtained by EMS.  The pt lives at home with his wife.  She left him in the bathroom to have a bowel movement.  She came back and pt was unresponsive.  He did have a bowel movement.  The pt woke up shortly afterwards and she was able to get him to a chair where she called EMS.  Per EMS, he was diaphoretic when they arrived.  I spoke with pt's wife.  She said he has been fine until today.  This has never happened to him in the past.  He normally walks.  She said he goes to an adult day care center in the day, but has not been exposed to any known covid patients.        Past Medical History:  Diagnosis Date  . Dementia ([place])   . Hypertension     Patient Active Problem List   Diagnosis Date Noted  . Dementia with behavioral disturbance ([place]) 12/15/2015  . Tonic seizure ([place]) 12/15/2015  . Altered mental status 10/29/2015  . Elevated lactic acid level 10/29/2015  . Encephalopathy acute 10/29/2015  . Enlarged prostate 10/29/2015  . Hypertension 10/29/2015  . 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 NO PAST SURGERIES         Family History  Problem Relation Age of Onset  . Hypertension Mother   . Dementia Sister     Social History   Tobacco Use  . Smoking status: Never Smoker  . Smokeless tobacco: Never Used  Substance Use Topics  . Alcohol use: No    Alcohol/week: 0.0 standard drinks  . Drug use: No    Home Medications Prior to Admission medications   Medication Sig Start Date End Date Taking? Authorizing Provider  amLODipine (NORVASC) 10 MG  tablet Take 10 mg by mouth daily.    Yes [provider]  donepezil (ARICEPT) 23 MG TABS tablet TAKE 1 TABLET BY MOUTH AT BEDTIME Patient taking differently: Take 23 mg by mouth at bedtime.  11/18/16  Yes Ward Givens, NP  lisinopril (ZESTRIL) 40 MG tablet Take 40 mg by mouth daily. 01/05/19  Yes [provider]  pantoprazole (PROTONIX) 40 MG tablet Take 40 mg by mouth daily. 06/02/19  Yes [provider]  hydrOXYzine (ATARAX/VISTARIL) 25 MG tablet Take 1 tablet (25 mg total) by mouth 2 (two) times daily as needed. Patient not taking: Reported on 08/09/2019 05/18/19   Debbora Presto, NP    Allergies    Patient has no known allergies.  Review of Systems   Review of Systems  Unable to perform ROS: Dementia    Physical Exam Updated Vital Signs BP 110/62   Pulse (!) 54   Temp (!) 96.4 F (35.8 C) (Axillary)   Resp 13   SpO2 95%   Physical Exam Vitals and nursing note reviewed.  Constitutional:      Appearance: Normal appearance.  HENT:     Head: Normocephalic and atraumatic.     Right Ear: External ear normal.     Left  Ear: External ear normal.     Nose: Nose normal.     Mouth/Throat:     Mouth: Mucous membranes are moist.     Pharynx: Oropharynx is clear.  Eyes:     Extraocular Movements: Extraocular movements intact.     Conjunctiva/sclera: Conjunctivae normal.     Pupils: Pupils are equal, round, and reactive to light.  Cardiovascular:     Rate and Rhythm: Normal rate and regular rhythm.     Pulses: Normal pulses.     Heart sounds: Normal heart sounds.  Pulmonary:     Effort: Pulmonary effort is normal.     Breath sounds: Normal breath sounds.  Abdominal:     General: Abdomen is flat. Bowel sounds are normal.     Palpations: Abdomen is soft.  Musculoskeletal:        General: Normal range of motion.     Cervical back: Normal range of motion and neck supple.  Skin:    General: Skin is warm.     Capillary Refill: Capillary refill takes less  than 2 seconds.  Neurological:     General: No focal deficit present.     Mental Status: He is alert. Mental status is at baseline.     ED Results / Procedures / Treatments   Labs (all labs ordered are listed, but only abnormal results are displayed) Labs Reviewed  CBC WITH DIFFERENTIAL/PLATELET - Abnormal; Notable for the following components:      Result Value   RBC 5.90 (*)    HCT 52.6 (*)    MCHC 29.3 (*)    Monocytes Absolute 1.1 (*)    All other components within normal limits  COMPREHENSIVE METABOLIC PANEL - Abnormal; Notable for the following components:   CO2 20 (*)    Glucose, Bld 120 (*)    Creatinine, Ser 1.95 (*)    GFR calc non Af Amer 32 (*)    GFR calc Af Amer 37 (*)    All other components within normal limits  TROPONIN I (HIGH SENSITIVITY) - Abnormal; Notable for the following components:   Troponin I (High Sensitivity) 41 (*)    All other components within normal limits  RESPIRATORY PANEL BY RT PCR (FLU A&B, COVID)  PROTIME-INR  URINALYSIS, ROUTINE W REFLEX MICROSCOPIC  CBG MONITORING, ED  TROPONIN I (HIGH SENSITIVITY)    EKG EKG Interpretation  Date/Time:  Monday August 09 2019 12:36:19 EST Ventricular Rate:  55 PR Interval:    QRS Duration: 112 QT Interval:  490 QTC Calculation: 469 R Axis:   8 Text Interpretation: Sinus rhythm Borderline intraventricular conduction delay No significant change since last tracing Confirmed by Jacalyn Lefevre [phone]) on 08/09/2019 12:49:14 PM   Radiology DG Chest Port 1 View  Result Date: 08/09/2019 CLINICAL DATA:  Syncope. EXAM: PORTABLE CHEST 1 VIEW COMPARISON:  05/13/2017. FINDINGS: Trachea is midline. Heart is enlarged, as before. Lungs are low in volume with minimal left basilar subsegmental atelectasis. No airspace consolidation or pleural fluid. Visualized upper abdomen is unremarkable. IMPRESSION: Low lung volumes.  No acute findings. Electronically Signed   By: Leanna Battles M.D.   On: 08/09/2019 13:35     Procedures Procedures (including critical care time)  Medications Ordered in ED Medications  sodium chloride 0.9 % bolus 1,000 mL (1,000 mLs Intravenous New Bag/Given 08/09/19 1244)    And  0.9 %  sodium chloride infusion (has no administration in time range)    ED Course  I have reviewed the triage vital  signs and the nursing notes.  Pertinent labs & imaging results that were available during my care of the patient were reviewed by me and considered in my medical decision making (see chart for details).    MDM Rules/Calculators/A&P                      Pt given IVFs, but is still unable to stand up on his own.  This is abnormal for pt.  The pt's troponin is also slightly elevated.  EKG shows nothing acute.  Pt d/w Dr. Blake Divine for admission for obs.   Final Clinical Impression(s) / ED Diagnoses Final diagnoses:  Syncope, unspecified syncope type  Elevated troponin    Rx / DC Orders ED Discharge Orders    None       Jacalyn Lefevre, MD 08/09/19 1555

## 2019-08-09 NOTE — ED Notes (Signed)
Attempted 2x to get blood work, unable to get labs.

## 2019-08-09 NOTE — ED Notes (Signed)
Unable to get orthostatic vital signs, due to pt can not sit up or stand. Attempted to reposition pt for X-ray.

## 2019-08-09 NOTE — ED Notes (Signed)
Blood return noted with IV start but not enough for sufficient lab sample.

## 2019-08-09 NOTE — ED Notes (Signed)
Provider at bedside speaking to pt wife; pt has advanced dementia and is nonverbal but nodes.

## 2019-08-09 NOTE — ED Triage Notes (Signed)
Per EMS- Patient is from home and has advanced dementia.  Patient took the patient to the bathroom and was sitting on the toilet. Wife left the patient on the toilet and when she returned the patient was unresponsive. Patiaent's wife called 911 and when they arrived the wife had the patient sitting in a chair and stated he was at his norm (nonverbal and drooling,) Patiaent's wife reports that the patient did have a BM and when she took the patient to his appointment last week his K+ level was high.

## 2019-08-09 NOTE — H&P (Signed)
History and Physical    Donald [name] UXN:[id number] DOB: 06-23-1941 DOA: 08/09/2019  PCP: Jani Gravel, MD  Patient coming from: Home   I have personally briefly reviewed patient's old medical records in [place]  Chief Complaint: brought in for unresponsiveness.   HPI: Donald [name] is a 79 y.o. male with medical history significant of hypertension, dementia, was brought in by his wife after she found him in the bathroom passed out. As per the wife, pt was left in the bathroom to have a bowel movement, and after a few min when she went to check on him, she found him unresponsive. Pt gradually came to consciousness and was brought to ED.  On arrival he was hypotensive, bradycardic,. Labs revealed AKI, , with elevated troponin. EKG shows sinus rhythm with intraventricular conduction delay.  As per the wife pt was fine until today.  No fever or chills, no vomiting or diarrhea.  He was referred to medical service for observation for syncope, and dehydration and possibly vasovagal event.   COVID 19 screening test is pending.    Review of Systems: could not be obtained due to dementia.   Past Medical History:  Diagnosis Date  . Dementia ([place])   . Hypertension     Past Surgical History:  Procedure Laterality Date  . NO PAST SURGERIES     Social work .   reports that he has never smoked. He has never used smokeless tobacco. He reports that he does not drink alcohol or use drugs.  No Known Allergies  Family History  Problem Relation Age of Onset  . Hypertension Mother   . Dementia Sister    See family history above, reviewed and pertinent.   Prior to Admission medications   Medication Sig Start Date End Date Taking? Authorizing Provider  amLODipine (NORVASC) 10 MG tablet Take 10 mg by mouth daily.    Yes [provider]  donepezil (ARICEPT) 23 MG TABS tablet TAKE 1 TABLET BY MOUTH AT BEDTIME Patient taking differently: Take 23 mg by  mouth at bedtime.  11/18/16  Yes Ward Givens, NP  lisinopril (ZESTRIL) 40 MG tablet Take 40 mg by mouth daily. 01/05/19  Yes [provider]  pantoprazole (PROTONIX) 40 MG tablet Take 40 mg by mouth daily. 06/02/19  Yes [provider]  hydrOXYzine (ATARAX/VISTARIL) 25 MG tablet Take 1 tablet (25 mg total) by mouth 2 (two) times daily as needed. Patient not taking: Reported on 08/09/2019 05/18/19   Debbora Presto, NP    Physical Exam: Vitals:   08/09/19 1530 08/09/19 1600 08/09/19 1630 08/09/19 1829  BP: (!) 120/58 118/76 (!) 122/93   Pulse: (!) 59 64 60   Resp: 16 (!) 22 17   Temp:      TempSrc:      SpO2: 100% 99% 100%   Weight:    72.6 kg  Height:    5\' 8"  (1.727 m)    Constitutional: NAD, calm, comfortable Vitals:   08/09/19 1530 08/09/19 1600 08/09/19 1630 08/09/19 1829  BP: (!) 120/58 118/76 (!) 122/93   Pulse: (!) 59 64 60   Resp: 16 (!) 22 17   Temp:      TempSrc:      SpO2: 100% 99% 100%   Weight:    72.6 kg  Height:    5\' 8"  (1.727 m)   Eyes: PERRL, lids and conjunctivae normal ENMT: Mucous membranes are dry  Neck: normal, supple, no masses,  Respiratory: clear to  auscultation bilaterally, no wheezing, no crackles. Normal respiratory effort. No accessory muscle use.  Cardiovascular: Regular rate and rhythm,  No extremity edema. 2+ pedal pulses.   Abdomen: no tenderness, no masses palpated.  Bowel sounds positive.  Musculoskeletal: no clubbing / cyanosis. No pedal edema.  Skin: no rashes, lesions, ulcers. No induration Neurologic: alert and following minimal commands.  Psychiatric: could not be assessed.     Labs on Admission: I have personally reviewed following labs and imaging studies  CBC: Recent Labs  Lab 08/09/19 1338  WBC 8.0  NEUTROABS 5.6  HGB 15.4  HCT 52.6*  MCV 89.2  PLT 253   Basic Metabolic Panel: Recent Labs  Lab 08/09/19 1338  NA 143  K 4.7  CL 110  CO2 20*  GLUCOSE 120*  BUN 21  CREATININE 1.95*  CALCIUM 9.0     GFR: Estimated Creatinine Clearance: 29.2 mL/min (A) (by C-G formula based on SCr of 1.95 mg/dL (H)). Liver Function Tests: Recent Labs  Lab 08/09/19 1338  AST 40  ALT 33  ALKPHOS 52  BILITOT 0.7  PROT 8.0  ALBUMIN 3.7   No results for input(s): LIPASE, AMYLASE in the last 168 hours. No results for input(s): AMMONIA in the last 168 hours. Coagulation Profile: Recent Labs  Lab 08/09/19 1338  INR 1.0   Cardiac Enzymes: No results for input(s): CKTOTAL, CKMB, CKMBINDEX, TROPONINI in the last 168 hours. BNP (last 3 results) No results for input(s): PROBNP in the last 8760 hours. HbA1C: No results for input(s): HGBA1C in the last 72 hours. CBG: Recent Labs  Lab 08/09/19 1246  GLUCAP 93   Lipid Profile: No results for input(s): CHOL, HDL, LDLCALC, TRIG, CHOLHDL, LDLDIRECT in the last 72 hours. Thyroid Function Tests: No results for input(s): TSH, T4TOTAL, FREET4, T3FREE, THYROIDAB in the last 72 hours. Anemia Panel: No results for input(s): VITAMINB12, FOLATE, FERRITIN, TIBC, IRON, RETICCTPCT in the last 72 hours. Urine analysis:    Component Value Date/Time   COLORURINE AMBER (A) 09/18/2018 2355   APPEARANCEUR HAZY (A) 09/18/2018 2355   LABSPEC 1.023 09/18/2018 2355   PHURINE 5.0 09/18/2018 2355   GLUCOSEU NEGATIVE 09/18/2018 2355   HGBUR NEGATIVE 09/18/2018 2355   BILIRUBINUR MODERATE (A) 09/18/2018 2355   KETONESUR NEGATIVE 09/18/2018 2355   PROTEINUR 30 (A) 09/18/2018 2355   UROBILINOGEN 0.2 02/02/2014 1804   NITRITE NEGATIVE 09/18/2018 2355   LEUKOCYTESUR NEGATIVE 09/18/2018 2355    Radiological Exams on Admission: DG Chest Port 1 View  Result Date: 08/09/2019 CLINICAL DATA:  Syncope. EXAM: PORTABLE CHEST 1 VIEW COMPARISON:  05/13/2017. FINDINGS: Trachea is midline. Heart is enlarged, as before. Lungs are low in volume with minimal left basilar subsegmental atelectasis. No airspace consolidation or pleural fluid. Visualized upper abdomen is unremarkable.  IMPRESSION: Low lung volumes.  No acute findings. Electronically Signed   By: Leanna Battles M.D.   On: 08/09/2019 13:35    EKG: Independently reviewed. Sinus rhythm with intraventricular conduction delay  Assessment/Plan Active Problems:   Syncope    Syncope/ unresponsiveness.  Possibly vasovagal event as it happened when he was having BM.  Pt appears comfortable and not in distress.  Repeat EKG In am. Trend troponin's and echocardiogram ordered.  Pt is minimally verbal and says yes and no.  PT./ot eval.  Orthostatics vital signs in am.  Nutrition consult.    Dehydration and AKI;  Hydrate with NS fluids and repeat renal parameters in am.  Check UA for infection.    Hypotension:  Hold bp meds at this time.    Dementia:  Resume home meds.   In view of his advanced age, and poor functional status, dementia, and debility palliative care consulted for goals of care.    Severity of Illness: The appropriate patient status for this patient is OBSERVATION. Observation status is judged to be reasonable and necessary in order to provide the required intensity of service to ensure the patient's safety. The patient's presenting symptoms, physical exam findings, and initial radiographic and laboratory data in the context of their medical condition is felt to place them at decreased risk for further clinical deterioration. Furthermore, it is anticipated that the patient will be medically stable for discharge from the hospital within 2 midnights of admission.     DVT prophylaxis: lovenox.  Code Status: DNR Family Communication: discussed with wife over the phone.  Disposition Plan: PENDING clinical improvement.  Consults called: palliative consult for goals of care.  Admission status: obs/ tele.    Kathlen Mody MD [facility]   08/09/2019, 6:31 PM

## 2019-08-09 NOTE — ED Notes (Signed)
Stephen from main lab phlebotomy verbalizes will come attempt blood draw for pt repeat troponin.

## 2019-08-10 ENCOUNTER — Other Ambulatory Visit: Payer: Self-pay

## 2019-08-10 ENCOUNTER — Observation Stay (HOSPITAL_BASED_OUTPATIENT_CLINIC_OR_DEPARTMENT_OTHER): Payer: Medicare Other

## 2019-08-10 DIAGNOSIS — U071 COVID-19: Secondary | ICD-10-CM | POA: Diagnosis present

## 2019-08-10 DIAGNOSIS — G309 Alzheimer's disease, unspecified: Secondary | ICD-10-CM

## 2019-08-10 DIAGNOSIS — I10 Essential (primary) hypertension: Secondary | ICD-10-CM | POA: Diagnosis present

## 2019-08-10 DIAGNOSIS — I959 Hypotension, unspecified: Secondary | ICD-10-CM | POA: Diagnosis present

## 2019-08-10 DIAGNOSIS — F0280 Dementia in other diseases classified elsewhere without behavioral disturbance: Secondary | ICD-10-CM

## 2019-08-10 DIAGNOSIS — J9601 Acute respiratory failure with hypoxia: Secondary | ICD-10-CM | POA: Diagnosis present

## 2019-08-10 DIAGNOSIS — R55 Syncope and collapse: Secondary | ICD-10-CM | POA: Diagnosis present

## 2019-08-10 DIAGNOSIS — N179 Acute kidney failure, unspecified: Secondary | ICD-10-CM

## 2019-08-10 DIAGNOSIS — I459 Conduction disorder, unspecified: Secondary | ICD-10-CM | POA: Diagnosis present

## 2019-08-10 DIAGNOSIS — R001 Bradycardia, unspecified: Secondary | ICD-10-CM | POA: Diagnosis present

## 2019-08-10 DIAGNOSIS — Z8249 Family history of ischemic heart disease and other diseases of the circulatory system: Secondary | ICD-10-CM | POA: Diagnosis not present

## 2019-08-10 DIAGNOSIS — Z66 Do not resuscitate: Secondary | ICD-10-CM | POA: Diagnosis present

## 2019-08-10 DIAGNOSIS — E860 Dehydration: Secondary | ICD-10-CM | POA: Diagnosis present

## 2019-08-10 LAB — TSH: TSH: 1.347 u[IU]/mL (ref 0.350–4.500)

## 2019-08-10 LAB — LACTATE DEHYDROGENASE: LDH: 238 U/L — ABNORMAL HIGH (ref 98–192)

## 2019-08-10 LAB — FIBRINOGEN: Fibrinogen: 430 mg/dL (ref 210–475)

## 2019-08-10 LAB — ECHOCARDIOGRAM COMPLETE
Height: 68 in
Weight: 2560 oz

## 2019-08-10 LAB — CBC
HCT: 48.7 % (ref 39.0–52.0)
Hemoglobin: 15 g/dL (ref 13.0–17.0)
MCH: 26.4 pg (ref 26.0–34.0)
MCHC: 30.8 g/dL (ref 30.0–36.0)
MCV: 85.7 fL (ref 80.0–100.0)
Platelets: 187 10*3/uL (ref 150–400)
RBC: 5.68 MIL/uL (ref 4.22–5.81)
RDW: 15.1 % (ref 11.5–15.5)
WBC: 5.3 10*3/uL (ref 4.0–10.5)
nRBC: 0 % (ref 0.0–0.2)

## 2019-08-10 LAB — BASIC METABOLIC PANEL
Anion gap: 11 (ref 5–15)
BUN: 18 mg/dL (ref 8–23)
CO2: 23 mmol/L (ref 22–32)
Calcium: 8.3 mg/dL — ABNORMAL LOW (ref 8.9–10.3)
Chloride: 106 mmol/L (ref 98–111)
Creatinine, Ser: 1.58 mg/dL — ABNORMAL HIGH (ref 0.61–1.24)
GFR calc Af Amer: 47 mL/min — ABNORMAL LOW (ref >60)
GFR calc non Af Amer: 41 mL/min — ABNORMAL LOW (ref >60)
Glucose, Bld: 85 mg/dL (ref 70–99)
Potassium: 4.4 mmol/L (ref 3.5–5.1)
Sodium: 140 mmol/L (ref 135–145)

## 2019-08-10 LAB — D-DIMER, QUANTITATIVE: D-Dimer, Quant: 0.67 ug/mL-FEU — ABNORMAL HIGH (ref 0.00–0.50)

## 2019-08-10 LAB — PROCALCITONIN: Procalcitonin: <0.1 ng/mL

## 2019-08-10 LAB — FERRITIN: Ferritin: 144 ng/mL (ref 24–336)

## 2019-08-10 LAB — C-REACTIVE PROTEIN: CRP: 0.7 mg/dL (ref <1.0)

## 2019-08-10 MED ORDER — ENOXAPARIN SODIUM 40 MG/0.4ML ~~LOC~~ SOLN
40.0000 mg | Freq: Every day | SUBCUTANEOUS | Status: DC
  Administered 2019-08-10 – 2019-08-11 (×2): 40 mg via SUBCUTANEOUS
  Filled 2019-08-10 (×3): qty 0.4

## 2019-08-10 MED ORDER — DEXAMETHASONE SODIUM PHOSPHATE 10 MG/ML IJ SOLN
6.0000 mg | Freq: Two times a day (BID) | INTRAMUSCULAR | Status: DC
  Administered 2019-08-10 – 2019-08-11 (×2): 6 mg via INTRAVENOUS
  Filled 2019-08-10 (×2): qty 1

## 2019-08-10 NOTE — ED Notes (Signed)
Phlebotomy called for 5am lab draw. Stated they will be here when possible.

## 2019-08-10 NOTE — ED Notes (Signed)
Multiple attempts were made by phlebotomy to draw morning labs, only light green and lav were able to be drawn. Phlebotomy was unable to get enough blood for gold tube.

## 2019-08-10 NOTE — ED Notes (Signed)
Pts brief was changed, patient was cleaned, and bedding was changed. Pt giving new blanket and left with call light within reach.

## 2019-08-10 NOTE — Evaluation (Signed)
Physical Therapy Evaluation Patient Details Name: Donald [name] MRN: [id number] DOB: [birth year] Today's Date: 08/10/2019   History of Present Illness  Pt is 79 y.o. male with medical history significant of hypertension, dementia, was brought in by his wife after she found him in the bathroom passed out. Pt found to be COVID positive.  Clinical Impression  Pt admitted with above diagnosis. Note evaluation somewhat limited due to pt in ED room (limited space and equipment) and not following commands.   He was able to transfer with min A and take a few steps.  Pt was unsteady and not following commands, so did not move away from bed.  Unable to get orthostatic BP due to not following commands.  May benefit from assist of 2 to improve safety for further treats.  Additionally, unsure of PLOF; however, pt appears mobile.  Pt currently with functional limitations due to the deficits listed below (see PT Problem List). Pt will benefit from skilled PT to increase their independence and safety with mobility to allow discharge to the venue listed below.       Follow Up Recommendations SNF    Equipment Recommendations  Other (comment)(TBD next venue)    Recommendations for Other Services       Precautions / Restrictions Precautions Precautions: Fall      Mobility  Bed Mobility Overal bed mobility: Needs Assistance Bed Mobility: Supine to Sit;Sit to Supine     Supine to sit: Min assist Sit to supine: Mod assist   General bed mobility comments: increased time and tactile/visual cues  Transfers Overall transfer level: Needs assistance Equipment used: 1 person hand held assist Transfers: Sit to/from Stand Sit to Stand: Min assist         General transfer comment: performed multiple times; difficult to get pt to return to bed - not following cammands; min A for balance  Ambulation/Gait Ambulation/Gait assistance: Min assist Gait Distance (Feet): 3 Feet Assistive device: 1  person hand held assist Gait Pattern/deviations: Decreased stride length;Narrow base of support;Trunk flexed     General Gait Details: unsteady; limited due to lines/leads in ED room and not follow commands  Stairs            Wheelchair Mobility    Modified Rankin (Stroke Patients Only)       Balance Overall balance assessment: Needs assistance Sitting-balance support: Bilateral upper extremity supported;Feet supported Sitting balance-Leahy Scale: Fair     Standing balance support: No upper extremity supported;During functional activity Standing balance-Leahy Scale: Poor                               Pertinent Vitals/Pain Pain Assessment: No/denies pain    Home Living Family/patient expects to be discharged to:: Unsure Living Arrangements: Spouse/significant other               Additional Comments: Pt unable to provide history due to dementia.  Per chart he was at home with wife.    Prior Function                 Hand Dominance        Extremity/Trunk Assessment   Upper Extremity Assessment Upper Extremity Assessment: Difficult to assess due to impaired cognition(Demonstrating at least 3/5 throughout and normal ROM; unable to follow further commands; no major deficits noted)    Lower Extremity Assessment Lower Extremity Assessment: Difficult to assess due to impaired cognition(Demonstrating at least 3/5 throughout  and normal ROM; unable to follow further commands; no major deficits noted)    Cervical / Trunk Assessment Cervical / Trunk Assessment: Normal  Communication   Communication: Other (comment);Expressive difficulties(limited due to dementia; only stated name during eval)  Cognition Arousal/Alertness: Awake/alert Behavior During Therapy: Impulsive(dementia: hard to focus on task at hand, kept "organizing lines") Overall Cognitive Status: No family/caregiver present to determine baseline cognitive functioning Area of  Impairment: Orientation;Problem solving;Following commands;Safety/judgement                       Following Commands: Follows one step commands inconsistently Safety/Judgement: Decreased awareness of safety   Problem Solving: Slow processing;Decreased initiation;Difficulty sequencing;Requires verbal cues;Requires tactile cues        General Comments General comments (skin integrity, edema, etc.): Pt on 2 LPM O2.  Fingers extremely cold and unable to get O2 sat but pt did not appear short of breath.  Attempted to get orthostatic bp but pt kept moving arms, standing up and down, not following commands - unable to get accurate BP    Exercises     Assessment/Plan    PT Assessment Patient needs continued PT services  PT Problem List Decreased strength;Decreased mobility;Decreased safety awareness;Decreased knowledge of precautions;Decreased activity tolerance;Cardiopulmonary status limiting activity;Decreased balance;Decreased knowledge of use of DME       PT Treatment Interventions DME instruction;Therapeutic activities;Cognitive remediation;Gait training;Therapeutic exercise;Patient/family education;Balance training;Functional mobility training    PT Goals (Current goals can be found in the Care Plan section)  Acute Rehab PT Goals Patient Stated Goal: unable PT Goal Formulation: Patient unable to participate in goal setting Time For Goal Achievement: 08/24/19 Potential to Achieve Goals: Good    Frequency Min 2X/week   Barriers to discharge        Co-evaluation               AM-PAC PT "6 Clicks" Mobility  Outcome Measure Help needed turning from your back to your side while in a flat bed without using bedrails?: A Little Help needed moving from lying on your back to sitting on the side of a flat bed without using bedrails?: A Little Help needed moving to and from a bed to a chair (including a wheelchair)?: A Lot Help needed standing up from a chair using your  arms (e.g., wheelchair or bedside chair)?: A Little Help needed to walk in hospital room?: A Lot Help needed climbing 3-5 steps with a railing? : A Lot 6 Click Score: 15    End of Session Equipment Utilized During Treatment: Gait belt;Oxygen Activity Tolerance: (limited due to confusion) Patient left: in bed;with call bell/phone within reach(no alarms in ED, bed rails up, assisted pt in starting to eat and he was continuing as I left) Nurse Communication: Mobility status PT Visit Diagnosis: Unsteadiness on feet (R26.81);Other abnormalities of gait and mobility (R26.89)    Time: 2841-3244 PT Time Calculation (min) (ACUTE ONLY): 35 min   Charges:   PT Evaluation $PT Eval Moderate Complexity: 1 Mod          Royetta Asal, PT Acute Rehab Services Pager [phone] [facility] [phone] [facility] [phone]   Rayetta Humphrey 08/10/2019, 5:30 PM

## 2019-08-10 NOTE — ED Notes (Signed)
Pt placed on 3L 

## 2019-08-10 NOTE — Progress Notes (Signed)
Echocardiogram 2D Echocardiogram has been performed.  Donald [name] Rolla Kedzierski 08/10/2019, 10:49 AM

## 2019-08-10 NOTE — Progress Notes (Signed)
PROGRESS NOTE    Donald [name]  SNK:[id number] DOB: Mar 28, 1941 DOA: 08/09/2019 PCP: Pearson Grippe, MD    Brief Narrative:   Donald [name] is a 79 y.o. male with medical history significant of hypertension, dementia, was brought in by his wife after she found him in the bathroom passed out. As per the wife, pt was left in the bathroom to have a bowel movement, and after a few min when she went to check on him, she found him unresponsive. Pt gradually came to consciousness and was brought to ED.  On arrival he was hypotensive, bradycardic,. Labs revealed AKI, , with elevated troponin. EKG shows sinus rhythm with intraventricular conduction delay.  COVID 19 screening test is positive. Earlier this am pt became hypoxic requiring about 3lit of [place] oxygen, . Initial CXR did not show any acute findings.  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 Echocardiogram shows grade 2 diastolic dysfunction with left ventricular ejection fraction of 60%. Troponins are minimally elevated and flat. EKG in the morning shows sinus rhythm. Orthostatic vital signs are pending. PT/OT/nutrition consult overall pending at this time.   Hypoxia/COVID-19 positive Nasal cannula oxygen to keep sats greater than 90% and Covid related labs ordered. Will start the patient on IV decadron as he is hypoxic.     Dehydration and AKI. Hydrated and renal parameters improved from 1.9-1.5. Urine analysis pending   Dementia No agitation seen. TSH within normal limits.  Disposition: As patient is covid 19 positive, hypoxic requiring 3l it of [place] oxygen, he is at increased risk of deterioration, and is not safe for discharge home. Recommend in patient stay until his hypoxia resolves.      DVT prophylaxis: LOVENOX.  Code Status: DNR Family  Communication: none at bedside, will call wife and update.  Disposition Plan: pending clinical improvement, improvement in hypoxia and PT evaluation.   Consultants:  None.   Procedures:  Echocardiogram.  Antimicrobials: none.   Subjective: Confused, no complaints of chest. Pain.   Objective: Vitals:   08/10/19 0630 08/10/19 1143 08/10/19 1300 08/10/19 1430  BP: 117/85 126/74 123/82 (!) 112/94  Pulse: (!) 52 (!) 55 63 (!) 58  Resp: 15 13 16 13   Temp:      TempSrc:      SpO2: 97% 97% 100% 100%  Weight:      Height:       No intake or output data in the 24 hours ending 08/10/19 1658 Filed Weights   08/09/19 1829  Weight: 72.6 kg    Examination:  General exam: Appears calm and comfortable  Respiratory system: tachypnea present, diminished air entry at bases, on 3 lit of [place] oxygen .  Cardiovascular system: S1 & S2 heard, RRR. No pedal edema. Gastrointestinal system: Abdomen is nondistended, soft and nontender.  Normal bowel sounds heard. Central nervous system: Alert but confused.  Extremities: able to move all extremities.  Skin: No rashes, Psychiatry:  No agitation seen.     Data Reviewed: I have personally reviewed following labs and imaging studies  CBC: Recent Labs  Lab 08/09/19 1338 08/10/19 0500  WBC 8.0 5.3  NEUTROABS 5.6  --   HGB 15.4 15.0  HCT 52.6* 48.7  MCV 89.2 85.7  PLT 253 187   Basic Metabolic Panel: Recent Labs  Lab 08/09/19 1338 08/10/19 0500  NA  143 140  K 4.7 4.4  CL 110 106  CO2 20* 23  GLUCOSE 120* 85  BUN 21 18  CREATININE 1.95* 1.58*  CALCIUM 9.0 8.3*   GFR: Estimated Creatinine Clearance: 36.1 mL/min (A) (by C-G formula based on SCr of 1.58 mg/dL (H)). Liver Function Tests: Recent Labs  Lab 08/09/19 1338  AST 40  ALT 33  ALKPHOS 52  BILITOT 0.7  PROT 8.0  ALBUMIN 3.7   No results for input(s): LIPASE, AMYLASE in the last 168 hours. No results for input(s): AMMONIA in the last 168 hours. Coagulation  Profile: Recent Labs  Lab 08/09/19 1338  INR 1.0   Cardiac Enzymes: No results for input(s): CKTOTAL, CKMB, CKMBINDEX, TROPONINI in the last 168 hours. BNP (last 3 results) No results for input(s): PROBNP in the last 8760 hours. HbA1C: No results for input(s): HGBA1C in the last 72 hours. CBG: Recent Labs  Lab 08/09/19 1246  GLUCAP 93   Lipid Profile: No results for input(s): CHOL, HDL, LDLCALC, TRIG, CHOLHDL, LDLDIRECT in the last 72 hours. Thyroid Function Tests: Recent Labs    08/10/19 0827  TSH 1.347   Anemia Panel: No results for input(s): VITAMINB12, FOLATE, FERRITIN, TIBC, IRON, RETICCTPCT in the last 72 hours. Sepsis Labs: No results for input(s): PROCALCITON, LATICACIDVEN in the last 168 hours.  Recent Results (from the past 240 hour(s))  Respiratory Panel by RT PCR (Flu A&B, Covid) - Nasopharyngeal Swab     Status: Abnormal   Collection Time: 08/09/19  5:52 PM   Specimen: Nasopharyngeal Swab  Result Value Ref Range Status   SARS Coronavirus 2 by RT PCR POSITIVE (A) NEGATIVE Final    Comment: RESULT CALLED TO, READ BACK BY AND VERIFIED WITH: OXENDINE,J. RN @1940  ON 01.04.2021 BY COHEN,K (NOTE) SARS-CoV-2 target nucleic acids are DETECTED. SARS-CoV-2 RNA is generally detectable in upper respiratory specimens  during the acute phase of infection. Positive results are indicative of the presence of the identified virus, but do not rule out bacterial infection or co-infection with other pathogens not detected by the test. Clinical correlation with patient history and other diagnostic information is necessary to determine patient infection status. The expected result is Negative. Fact Sheet for Patients:  PinkCheek.be Fact Sheet for Healthcare Providers: GravelBags.it This test is not yet approved or cleared by the [place] FDA and  has been authorized for detection and/or diagnosis of SARS-CoV-2  by FDA under an Emergency Use Authorization (EUA).  This EUA will remain in effect (meaning this test can  be used) for the duration of  the COVID-19 declaration under Section 564(b)(1) of the Act, 21 U.S.C. section 360bbb-3(b)(1), unless the authorization is terminated or revoked sooner.    Influenza A by PCR NEGATIVE NEGATIVE Final   Influenza B by PCR NEGATIVE NEGATIVE Final    Comment: (NOTE) The Xpert Xpress SARS-CoV-2/FLU/RSV assay is intended as an aid in  the diagnosis of influenza from Nasopharyngeal swab specimens and  should not be used as a sole basis for treatment. Nasal washings and  aspirates are unacceptable for Xpert Xpress SARS-CoV-2/FLU/RSV  testing. Fact Sheet for Patients: PinkCheek.be Fact Sheet for Healthcare Providers: GravelBags.it This test is not yet approved or cleared by the [place] FDA and  has been authorized for detection and/or diagnosis of SARS-CoV-2 by  FDA under an Emergency Use Authorization (EUA). This EUA will remain  in effect (meaning this test can be used) for the duration of the  Covid-19 declaration under Section 564(b)(1) of the  Act, 21  U.S.C. section 360bbb-3(b)(1), unless the authorization is  terminated or revoked. Performed at [facility], [address] [place], [place] [address]          Radiology Studies: [facility] Chest Port 1 View  Result Date: 08/09/2019 CLINICAL DATA:  Syncope. EXAM: PORTABLE CHEST 1 VIEW COMPARISON:  05/13/2017. FINDINGS: Trachea is midline. Heart is enlarged, as before. Lungs are low in volume with minimal left basilar subsegmental atelectasis. No airspace consolidation or pleural fluid. Visualized upper abdomen is unremarkable. IMPRESSION: Low lung volumes.  No acute findings. Electronically Signed   By: Leanna Battles M.D.   On: 08/09/2019 13:35   ECHOCARDIOGRAM COMPLETE  Result Date: 08/10/2019   ECHOCARDIOGRAM REPORT    Patient Name:   BREYLIN DOM Mehlberg Date of Exam: 08/10/2019 Medical Rec #:  [id number]                  Height:       68.0 in Accession #:    [id number]                 Weight:       160.0 lb Date of Birth:  10/09/1940                  BSA:          1.86 m Patient Age:    78 years                   BP:           117/73 mmHg Patient Gender: M                          HR:           60 bpm. Exam Location:  Inpatient Procedure: 2D Echo, Color Doppler and Cardiac Doppler                                MODIFIED REPORT: This report was modified by Weston Brass MD on 08/10/2019 due to syngo error.  Indications:     R55 Syncope  History:         Patient has no prior history of Echocardiogram examinations.                  Risk Factors:Hypertension. COVID+ on 08/09/19.  Sonographer:     Irving Burton Senior RDCS Referring Phys:  Herminio Heads Diagnosing Phys: Weston Brass MD  Sonographer Comments: No subcostal window. Technically difficult study due to altered mental status. IMPRESSIONS  1. Left ventricular ejection fraction, by visual estimation, is 60 to 65%. The left ventricle has normal function. Left ventricular septal wall thickness was mildly increased. There is no left ventricular hypertrophy.  2. Left ventricular diastolic parameters are consistent with Grade II diastolic dysfunction (pseudonormalization).  3. Global right ventricle has mildly reduced systolic function.The right ventricular size is normal. No increase in right ventricular wall thickness.  4. Left atrial size was normal.  5. Right atrial size was normal.  6. The mitral valve is normal in structure. Mild to moderate mitral valve regurgitation. No evidence of mitral stenosis.  7. The tricuspid valve is normal in structure.  8. The aortic valve is normal in structure. Aortic valve regurgitation is mild. No evidence of aortic valve sclerosis or stenosis.  9. The pulmonic valve was grossly normal. Pulmonic valve regurgitation  is trivial. 10. TR signal  is inadequate for assessing pulmonary artery systolic pressure. FINDINGS  Left Ventricle: Left ventricular ejection fraction, by visual estimation, is 60 to 65%. The left ventricle has normal function. The left ventricle is not well visualized. There is no left ventricular hypertrophy. Left ventricular diastolic parameters are consistent with Grade II diastolic dysfunction (pseudonormalization). Indeterminate filling pressures. Right Ventricle: The right ventricular size is normal. No increase in right ventricular wall thickness. Global RV systolic function is has mildly reduced systolic function. Left Atrium: Left atrial size was normal in size. Right Atrium: Right atrial size was normal in size Pericardium: There is no evidence of pericardial effusion. Mitral Valve: The mitral valve is normal in structure. Mild to moderate mitral valve regurgitation. No evidence of mitral valve stenosis by observation. Tricuspid Valve: The tricuspid valve is normal in structure. Tricuspid valve regurgitation is trivial. Aortic Valve: The aortic valve is normal in structure. Aortic valve regurgitation is mild. The aortic valve is structurally normal, with no evidence of sclerosis or stenosis. Pulmonic Valve: The pulmonic valve was grossly normal. Pulmonic valve regurgitation is trivial. Pulmonic regurgitation is trivial. Aorta: The ascending aorta was not well visualized and the aortic root is normal in size and structure. Venous: The inferior vena cava was not well visualized. IAS/Shunts: The interatrial septum was not well visualized.  LEFT VENTRICLE PLAX 2D LVIDd:         4.70 cm  Diastology LVIDs:         4.10 cm  LV e' lateral:   7.83 cm/s LV PW:         1.00 cm  LV E/e' lateral: 8.1 LV IVS:        1.10 cm  LV e' medial:    5.55 cm/s LVOT diam:     2.00 cm  LV E/e' medial:  11.4 LV SV:         28 ml LV SV Index:   15.02 LVOT Area:     3.14 cm  RIGHT VENTRICLE RV S prime:     11.10 cm/s TAPSE (M-mode): 1.8 cm LEFT ATRIUM              Index       RIGHT ATRIUM          Index LA diam:        2.90 cm 1.56 cm/m  RA Area:     7.51 cm LA Vol (A2C):   56.3 ml 30.29 ml/m RA Volume:   11.70 ml 6.29 ml/m LA Vol (A4C):   38.1 ml 20.50 ml/m LA Biplane Vol: 46.2 ml 24.85 ml/m  AORTIC VALVE LVOT Vmax:   52.20 cm/s LVOT Vmean:  38.300 cm/s LVOT VTI:    0.110 m  AORTA Ao Root diam: 3.10 cm Ao Asc diam:  3.20 cm MITRAL VALVE MV Area (PHT): 3.21 cm             SHUNTS MV PHT:        68.44 msec           Systemic VTI:  0.11 m MV Decel Time: 236 msec             Systemic Diam: 2.00 cm MV E velocity: 63.40 cm/s 103 cm/s MV A velocity: 29.10 cm/s 70.3 cm/s MV E/A ratio:  2.18       1.5  Weston BrassGayatri Acharya MD Electronically signed by Weston BrassGayatri Acharya MD Signature Date/Time: 08/10/2019/3:08:31 PM    Final (Updated)  Scheduled Meds: . donepezil  23 mg Oral QHS  . enoxaparin (LOVENOX) injection  40 mg Subcutaneous Daily  . pantoprazole  40 mg Oral Daily   Continuous Infusions: . sodium chloride 125 mL (08/10/19 0605)     LOS: 0 days        Kathlen Mody, MD [facility]  08/10/2019, 4:58 PM

## 2019-08-10 NOTE — ED Notes (Signed)
Wife of paitent would like a call with update, Kathrynn Running. [phone].

## 2019-08-10 NOTE — Evaluation (Signed)
SLP Cancellation Note  Patient Details Name: AZIM GILLINGHAM MRN: [id number] DOB: 1940-09-14   Cancelled treatment:       Reason Eval/Treat Not Completed: Other (comment)(called ED at 1540 but no answer, will continue efforts)   Chales Abrahams 08/10/2019, 3:43 PM  Rolena Infante, MS [facility] SLP Acute Rehab Services Office [phone]

## 2019-08-11 ENCOUNTER — Ambulatory Visit: Payer: Medicare Other | Admitting: Family Medicine

## 2019-08-11 LAB — CBC WITH DIFFERENTIAL/PLATELET
Abs Immature Granulocytes: 0.03 10*3/uL (ref 0.00–0.07)
Basophils Absolute: 0 10*3/uL (ref 0.0–0.1)
Basophils Relative: 0 %
Eosinophils Absolute: 0 10*3/uL (ref 0.0–0.5)
Eosinophils Relative: 0 %
HCT: 50.2 % (ref 39.0–52.0)
Hemoglobin: 15.3 g/dL (ref 13.0–17.0)
Immature Granulocytes: 0 %
Lymphocytes Relative: 11 %
Lymphs Abs: 0.8 10*3/uL (ref 0.7–4.0)
MCH: 26.4 pg (ref 26.0–34.0)
MCHC: 30.5 g/dL (ref 30.0–36.0)
MCV: 86.7 fL (ref 80.0–100.0)
Monocytes Absolute: 0.4 10*3/uL (ref 0.1–1.0)
Monocytes Relative: 6 %
Neutro Abs: 6.1 10*3/uL (ref 1.7–7.7)
Neutrophils Relative %: 83 %
Platelets: 219 10*3/uL (ref 150–400)
RBC: 5.79 MIL/uL (ref 4.22–5.81)
RDW: 15 % (ref 11.5–15.5)
WBC: 7.4 10*3/uL (ref 4.0–10.5)
nRBC: 0 % (ref 0.0–0.2)

## 2019-08-11 LAB — MAGNESIUM: Magnesium: 1.8 mg/dL (ref 1.7–2.4)

## 2019-08-11 LAB — C-REACTIVE PROTEIN: CRP: 1.2 mg/dL — ABNORMAL HIGH (ref <1.0)

## 2019-08-11 LAB — FERRITIN: Ferritin: 156 ng/mL (ref 24–336)

## 2019-08-11 MED ORDER — PRO-STAT SUGAR FREE PO LIQD
30.0000 mL | Freq: Two times a day (BID) | ORAL | Status: DC
  Administered 2019-08-11: 30 mL via ORAL
  Filled 2019-08-11: qty 30

## 2019-08-11 MED ORDER — DEXAMETHASONE SODIUM PHOSPHATE 10 MG/ML IJ SOLN
6.0000 mg | Freq: Every day | INTRAMUSCULAR | Status: DC
  Administered 2019-08-12: 6 mg via INTRAVENOUS
  Filled 2019-08-11: qty 1

## 2019-08-11 MED ORDER — ENSURE ENLIVE PO LIQD
237.0000 mL | Freq: Two times a day (BID) | ORAL | Status: DC
  Administered 2019-08-11 – 2019-08-12 (×2): 237 mL via ORAL

## 2019-08-11 MED ORDER — ADULT MULTIVITAMIN W/MINERALS CH
1.0000 | ORAL_TABLET | Freq: Every day | ORAL | Status: DC
  Administered 2019-08-11: 1 via ORAL
  Filled 2019-08-11: qty 1

## 2019-08-11 NOTE — Progress Notes (Signed)
Call placed to [facility] to provide update on plan of care. No answer and unable to leave voicemail.

## 2019-08-11 NOTE — Progress Notes (Signed)
Unable to assess Oxygen saturation out of bed at this time. Patient is not following verbal commands.

## 2019-08-11 NOTE — Evaluation (Signed)
Clinical/Bedside Swallow Evaluation Patient Details  Name: Donald [name] MRN: [id number] Date of Birth: 1941/01/30  Today's Date: 08/11/2019 Time: SLP Start Time (ACUTE ONLY): 8546 SLP Stop Time (ACUTE ONLY): 0941 SLP Time Calculation (min) (ACUTE ONLY): 12 min  Past Medical History:  Past Medical History:  Diagnosis Date  . Dementia ([place])   . Hypertension    Past Surgical History:  Past Surgical History:  Procedure Laterality Date  . NO PAST SURGERIES     HPI:  [name] is a 79 y.o. male with medical history significant of hypertension, dementia, was brought in by his wife after she found him in the bathroom passed out. As per the wife, pt was left in the bathroom to have a bowel movement, and after a few min when she went to check on him, she found him unresponsive. Pt gradually came to consciousness and was brought to ED. On arrival he was hypotensive, bradycardic,. Labs revealed AKI, , with elevated troponin. EKG shows sinus rhythm with intraventricular conduction delay.  COVID 19 screening test positive.  Pt became hypoxic requiring about 3lit of [place] oxygen, now down to 1 liter.  Initial CXR negative per MD notes.   Assessment / Plan / Recommendation Clinical Impression  Functional oropharyngeal swallow ability present for po observed *breakfast including juice 3 ounces, milk 3 ounces, 2 pieces of bacon, 1/2 french toast.  Pt CN exam negative although he did not follow directions - He is able to seal lips on spoon/straw.  Adequate mastication noted and self modulation of intake with tough bacon noted - indicative of increased safety with po.  Swallow appeared timely with no evidence of residuals.  Pt did not conduct [place] due to cognitive deficits requiring rest break.    Recommend continue regular/thin diet - focus on finger foods that may increase his ability to self feed.  Medications as tolerated.  Intermittent supervision helpful to improve pt's focus on eating  due to decreased attention.  No SLP follow up needed.  Thanks for this consult.  SLP Visit Diagnosis: Dysphagia, oral phase (R13.11)    Aspiration Risk    mild due to dementia   Diet Recommendation Regular;Thin liquid   Liquid Administration via: Cup;Straw Medication Administration: (as tolerated) Supervision: Patient able to self feed;Intermittent supervision to cue for compensatory strategies(intermittent supervision to improve intake) Compensations: Minimize environmental distractions;Slow rate;Small sips/bites Postural Changes: Remain upright for at least 30 minutes after po intake;Seated upright at 90 degrees    Other  Recommendations Oral Care Recommendations: Oral care BID   Follow up Recommendations None      Frequency and Duration   n/a         Prognosis    n/a    Swallow Study   General Date of Onset: 08/11/19 HPI: [name] is a 79 y.o. male with medical history significant of hypertension, dementia, was brought in by his wife after she found him in the bathroom passed out. As per the wife, pt was left in the bathroom to have a bowel movement, and after a few min when she went to check on him, she found him unresponsive. Pt gradually came to consciousness and was brought to ED. On arrival he was hypotensive, bradycardic,. Labs revealed AKI, , with elevated troponin. EKG shows sinus rhythm with intraventricular conduction delay.  COVID 19 screening test positive.  Pt became hypoxic requiring about 3lit of [place] oxygen, now down to 1 liter.  Initial CXR negative per MD notes. Type of Study:  Bedside Swallow Evaluation Previous Swallow Assessment: none in the system Diet Prior to this Study: Regular;Thin liquids Temperature Spikes Noted: No Respiratory Status: Nasal cannula(1) History of Recent Intubation: No Behavior/Cognition: Alert;Doesn't follow directions(pt has dementia) Oral Care Completed by SLP: No Oral Cavity - Dentition: Adequate natural  dentition Vision: Functional for self-feeding(for finger foods) Self-Feeding Abilities: Able to feed self;Needs assist Patient Positioning: Upright in bed Baseline Vocal Quality: Normal(pt voice only once during testing) Volitional Cough: Cognitively unable to elicit Volitional Swallow: Unable to elicit    Oral/Motor/Sensory Function Overall Oral Motor/Sensory Function: Other (comment)(pt did not follow commands but able to seal lips on spoon and straw)   Ice Chips Ice chips: Not tested   Thin Liquid Thin Liquid: Within functional limits Presentation: Straw    Nectar Thick Nectar Thick Liquid: Not tested   Honey Thick Honey Thick Liquid: Not tested   Puree Puree: Within functional limits Presentation: Spoon   Solid     Solid: Within functional limits Presentation: Self Fed Other Comments: bacon, pancake - benefited from verbal and tactile cues to continue intake-likely due to decreased sustained attention from dementia      Donald [name] 08/11/2019,9:41 AM  Donald Infante, MS [facility] SLP Acute Rehab Services Office [phone]

## 2019-08-11 NOTE — TOC Progression Note (Addendum)
Transition of Care [facility]) - Progression Note    Patient Details  Name: Donald [name] MRN: [id number] Date of Birth: 1940-10-15  Transition of [facility] CM/SW Contact  Geni Bers, RN Phone Number: 08/11/2019, 4:20 PM  Clinical Narrative:     Spoke with pt's wife. Pt will return home with [facility] for [facility] and DME O2. Plan for in AM.        Expected Discharge Plan and Services                                                 Social Determinants of Health (SDOH) Interventions    Readmission Risk Interventions No flowsheet data found.

## 2019-08-11 NOTE — Progress Notes (Signed)
OT Cancellation Note  Patient Details Name: [name] MRN: [id number] DOB: 19-Aug-1940   Cancelled Treatment:    Reason Eval/Treat Not Completed: Other (comment).  Pt has a h/o dementia and is not following commands at this time.  Will defer OT to SNF unless cognition improves enough to participate.  Please page me if pt will benefit.  Thanks  Calin Fantroy 08/11/2019, 1:41 PM  Roxana Hires, OTR/L Acute Rehabilitation Services 08/11/2019

## 2019-08-11 NOTE — Progress Notes (Signed)
PROGRESS NOTE  Donald [name]  DOB: 1941/06/13  PCP: Pearson Grippe, MD ZHG:[id number]  DOA: 08/09/2019 Admitted From: Home  LOS: 1 day   Chief Complaint  Patient presents with  . Loss of Consciousness  . COVID +   Brief narrative: Patient is a 79 y.o. male with PMH of hypertension, advanced dementia who was brought in by his wife to the ED on 1/5 after she found him in the bathroom passed out. Patient was apparently left in the bathroom to have a bowel movement, and after a few min when she went to check on him, she found him unresponsive. Pt gradually came to consciousness and was brought to ED.  On arrival he was hypotensive, bradycardic. Labs revealed AKI, with elevated troponin.  EKG shows sinus rhythm with intraventricular conduction delay.   COVID 19 screening and is in test positive.   Pt became hypoxic requiring about 1lit of [place] oxygen. Chest x-ray was negative.  Subjective: Patient was seen and examined this morning.  Elderly African-American male propped up in bed.  Alert, awake, unable to answer questions or follow questions.  Has advanced dementia at baseline.  Does not seem to be in physical distress.  Assessment/Plan: Syncope/unresponsiveness Most likely a vasovagal event.  Echocardiogram showed grade 2 diastolic dysfunction with left ventricular ejection fraction of 60%. Troponins are minimally elevated and flat.  EKG showed sinus rhythm without any ST-T wave changes. Orthostatic vital signs are pending. PT/OT/nutrition consult overall pending at this time.  COVID Infection Acute respiratory failure with hypoxia -3 LPM O2 -Presented with syncope, hypoxia -Chest imaging -negative chest x-ray -Treatment: Decadron 6 mg daily. -Oxygen - SpO2: 100 % O2 Flow Rate (L/min): 2 L/min -Continue airborne/contact isolation precautions. -WBC and inflammatory markers trend as below.  Lab Results  Component Value Date   SARSCOV2NAA POSITIVE (A) 08/09/2019      Recent Labs  Lab 08/09/19 1338 08/10/19 0500 08/11/19 0336  WBC 8.0 5.3 7.4   Recent Labs    08/10/19 1730 08/11/19 0336  DDIMER 0.67*  --   FERRITIN 144 156  LDH 238*  --   CRP 0.7 1.2*   Advanced dementia -Alert and awake but unable to answer any questions or follow commands.  Not noted to be agitated.   -Continue supportive care. -Home meds include donepezil.  I would hold it for now because it has cholinergic properties as well which can cause bradycardia, hypotension.  Mobility: PT recommended SNF. Diet: Regular diet Fluid: None DVT prophylaxis:  Lovenox Code Status:  DNR Family Communication:  Called and updated patient's wife.  She does not want patient to go to SNF. Expected Discharge:  Tentative plan to discharge to home tomorrow with home health services.  Consultants:    Procedures:    Antimicrobials: Anti-infectives (From admission, onward)   None        Code Status: DNR   Diet Order            Diet regular Room service appropriate? Yes; Fluid consistency: Thin  Diet effective now              Infusions:  . sodium chloride 50 mL/hr at 08/11/19 0557    Scheduled Meds: . [START ON 08/12/2019] dexamethasone (DECADRON) injection  6 mg Intravenous Daily  . donepezil  23 mg Oral QHS  . enoxaparin (LOVENOX) injection  40 mg Subcutaneous Daily  . pantoprazole  40 mg Oral Daily    PRN meds:    Objective: Vitals:  08/11/19 0555 08/11/19 1006  BP: 137/79 133/77  Pulse: 67 75  Resp: 16 16  Temp: 98.2 F (36.8 C)   SpO2: 100%     Intake/Output Summary (Last 24 hours) at 08/11/2019 1232 Last data filed at 08/11/2019 0015 Gross per 24 hour  Intake 1060 ml  Output --  Net 1060 ml   Filed Weights   08/09/19 1829 08/11/19 0008  Weight: 72.6 kg 71 kg   Weight change: -1.588 kg Body mass index is 23.8 kg/m.   Physical Exam: General exam: Appears calm and comfortable.  Skin: No rashes, lesions or ulcers. HEENT: Atraumatic,  normocephalic, supple neck, no obvious bleeding Lungs: Clear to auscultation bilaterally CVS: Regular rate and rhythm, no murmur GI/Abd soft, nontender, nondistended, bowel sound present CNS: Alert, awake, advanced dementia at baseline.  Does not respond to verbal or motor commands Psychiatry: Mood appropriate Extremities: No pedal edema, no calf tenderness  Data Review: I have personally reviewed the laboratory data and studies available.  Recent Labs  Lab 08/09/19 1338 08/10/19 0500 08/11/19 0336  WBC 8.0 5.3 7.4  NEUTROABS 5.6  --  6.1  HGB 15.4 15.0 15.3  HCT 52.6* 48.7 50.2  MCV 89.2 85.7 86.7  PLT 253 187 219   Recent Labs  Lab 08/09/19 1338 08/10/19 0500 08/11/19 0336  NA 143 140  --   K 4.7 4.4  --   CL 110 106  --   CO2 20* 23  --   GLUCOSE 120* 85  --   BUN 21 18  --   CREATININE 1.95* 1.58*  --   CALCIUM 9.0 8.3*  --   MG  --   --  1.8   Terrilee Croak, MD  [facility] 08/11/2019

## 2019-08-11 NOTE — Progress Notes (Signed)
Initial Nutrition Assessment  RD working remotely.   DOCUMENTATION CODES:   Not applicable  INTERVENTION:  - will order Ensure Enlive BID, each supplement provides 350 kcal and 20 grams of protein. - will order 30 mL Prostat BID, each supplement provides 100 kcal and 15 grams of protein. - will order daily multivitamin with minerals. - continue to encourage PO intakes and floor staff to provide feeding assistance, if needed.    NUTRITION DIAGNOSIS:   Increased nutrient needs related to acute illness(COVID-19) as evidenced by estimated needs.  GOAL:   Patient will meet greater than or equal to 90% of their needs  MONITOR:   PO intake, Supplement acceptance, Labs, Weight trends  REASON FOR ASSESSMENT:   Consult Assessment of nutrition requirement/status  ASSESSMENT:   79 y.o. male with medical history of HTN and advanced dementia. He was taken to the ED by his wife on 1/5 after she found him passed out/unresponsive in their bathroom. On arrival, he was hypotensive and bradycardic. Labs in the ED indicated AKI and elevated troponin. COVID-19 test in the ED was positive, CXR was unremarkable.  No intakes documented since admission. Flow sheet documentation indicates that patient has been non-verbal since admission. Unable to reach patient's wife on her cell phone.   Per chart review, current weight is 156 lb and weight has been stable over the past 6 months. Weight on 10/13/18 was 162 lb which indicates 6 lb weight loss (3.7% body weight) in the past 10 months; not significant for time frame.  Per notes: - syncope/unresponsiveness--thought to be 2/2 vasovagal event - COVID-19 - hx of advanced dementia--noted agitation - PT recommending SNF at time of d/c but patient's wife declines - d/c plan for home as soon as tomorrow (1/7)   Labs reviewed; creatinine: 1.58 mg/dl, Ca: 8.3 mg/dl, GFR: 47 ml/min. Medications reviewed. IVF; NS @ 50 ml/hr.     NUTRITION - FOCUSED  PHYSICAL EXAM:  unable to complete for COVID+ patient.   Diet Order:   Diet Order            Diet regular Room service appropriate? Yes; Fluid consistency: Thin  Diet effective now              EDUCATION NEEDS:   No education needs have been identified at this time  Skin:  Skin Assessment: Reviewed RN Assessment  Last BM:  1/6  Height:   Ht Readings from Last 1 Encounters:  08/09/19 5\' 8"  (1.727 m)    Weight:   Wt Readings from Last 1 Encounters:  08/11/19 71 kg    Ideal Body Weight:  70 kg  BMI:  Body mass index is 23.8 kg/m.  Estimated Nutritional Needs:   Kcal:  1850-2100 kcal  Protein:  92-105 grams  Fluid:  >/= 2 L/day     10/09/19, MS, RD, LDN, [facility] Inpatient Clinical Dietitian Pager # [phone] After hours/weekend pager # [phone]

## 2019-08-12 LAB — CBC WITH DIFFERENTIAL/PLATELET
Abs Immature Granulocytes: 0.05 10*3/uL (ref 0.00–0.07)
Basophils Absolute: 0 10*3/uL (ref 0.0–0.1)
Basophils Relative: 0 %
Eosinophils Absolute: 0 10*3/uL (ref 0.0–0.5)
Eosinophils Relative: 0 %
HCT: 45.3 % (ref 39.0–52.0)
Hemoglobin: 13.7 g/dL (ref 13.0–17.0)
Immature Granulocytes: 1 %
Lymphocytes Relative: 16 %
Lymphs Abs: 1.2 10*3/uL (ref 0.7–4.0)
MCH: 26.1 pg (ref 26.0–34.0)
MCHC: 30.2 g/dL (ref 30.0–36.0)
MCV: 86.3 fL (ref 80.0–100.0)
Monocytes Absolute: 1.1 10*3/uL — ABNORMAL HIGH (ref 0.1–1.0)
Monocytes Relative: 14 %
Neutro Abs: 5.5 10*3/uL (ref 1.7–7.7)
Neutrophils Relative %: 69 %
Platelets: 212 10*3/uL (ref 150–400)
RBC: 5.25 MIL/uL (ref 4.22–5.81)
RDW: 14.6 % (ref 11.5–15.5)
WBC: 7.9 10*3/uL (ref 4.0–10.5)
nRBC: 0 % (ref 0.0–0.2)

## 2019-08-12 LAB — FERRITIN: Ferritin: 206 ng/mL (ref 24–336)

## 2019-08-12 LAB — BASIC METABOLIC PANEL
Anion gap: 12 (ref 5–15)
BUN: 27 mg/dL — ABNORMAL HIGH (ref 8–23)
CO2: 18 mmol/L — ABNORMAL LOW (ref 22–32)
Calcium: 8 mg/dL — ABNORMAL LOW (ref 8.9–10.3)
Chloride: 106 mmol/L (ref 98–111)
Creatinine, Ser: 1.48 mg/dL — ABNORMAL HIGH (ref 0.61–1.24)
GFR calc Af Amer: 51 mL/min — ABNORMAL LOW (ref >60)
GFR calc non Af Amer: 44 mL/min — ABNORMAL LOW (ref >60)
Glucose, Bld: 106 mg/dL — ABNORMAL HIGH (ref 70–99)
Potassium: 4.7 mmol/L (ref 3.5–5.1)
Sodium: 136 mmol/L (ref 135–145)

## 2019-08-12 LAB — C-REACTIVE PROTEIN: CRP: 0.6 mg/dL (ref <1.0)

## 2019-08-12 LAB — MAGNESIUM: Magnesium: 1.8 mg/dL (ref 1.7–2.4)

## 2019-08-12 MED ORDER — DEXAMETHASONE 6 MG PO TABS: 6.0000 mg | ORAL_TABLET | Freq: Every day | ORAL | 0 refills | Status: AC

## 2019-08-12 MED ORDER — PRO-STAT SUGAR FREE PO LIQD: 30.0000 mL | Freq: Two times a day (BID) | ORAL | 0 refills | Status: DC

## 2019-08-12 MED ORDER — ENSURE ENLIVE PO LIQD: 237.0000 mL | Freq: Two times a day (BID) | ORAL | 12 refills | Status: DC

## 2019-08-12 NOTE — Discharge Summary (Signed)
Physician Discharge Summary  Donald [name] QIH:[id number] DOB: Jan 31, 1941 DOA: 08/09/2019  PCP: Pearson Grippe, MD  Admit date: 08/09/2019 Discharge date: 08/12/2019  Admitted From: Home Discharge disposition: Home with home health   Code Status: DNR  Diet Recommendation: Regular diet   Recommendations for Outpatient Follow-Up:   1. 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 male with PMH of hypertension, advanced dementia who was brought in by his wife to the ED on 1/5 after she found him in the bathroom passed out. Patient was apparently left in the bathroom to have a bowel movement, and after a few min when she went to check on him, she found him unresponsive. Pt gradually came to consciousness and was brought to ED.  On arrival he was hypotensive, bradycardic. Labs revealed AKI, with elevated troponin.  EKG shows sinus rhythm with intraventricular conduction delay.  COVID 19 screening and is in test positive.  Pt became hypoxic requiring about 1lit of [place] oxygen. Chest x-ray was negative.  Hospital Course:  Syncope/unresponsiveness Most likely a vasovagal event.  Echocardiogram showed grade 2 diastolic dysfunction with left ventricular ejection fraction of 60%. Troponins are minimally elevated and flat.  EKG showed sinus rhythm without any ST-T wave changes. Orthostatic vital signs were negative PT eval was obtained.  Patient was recommended for SNF placement.  I discussed with patient's wife.  She wants to take him home with home health, PT, RN, OT, aide.  COVID Infection Acute respiratory failure with hypoxia -3 LPM O2 -Presented with syncope, hypoxia -Chest imaging -negative chest x-ray -Treatment: Decadron 6 mg daily for 5 days because of  hypoxia.  On 3 L oxygen now.  I think it is most likely due to atelectasis and weakness. -Continue contact isolation precautions. -WBC and inflammatory markers have not been impressive. Lab Results  Component Value Date   SARSCOV2NAA POSITIVE (A) 08/09/2019    Recent Labs  Lab 08/09/19 1338 08/10/19 0500 08/11/19 0336 08/12/19 0409  WBC 8.0 5.3 7.4 7.9   Recent Labs    08/10/19 1730 08/11/19 0336 08/12/19 0410  DDIMER 0.67*  --   --   FERRITIN 144 156 206  LDH 238*  --   --   CRP 0.7 1.2* 0.6    Advanced dementia -Alert and awake but unable to answer any questions or follow commands.  Not noted to be agitated.   -Continue supportive care. -Home meds include donepezil.  I held it for now because it has cholinergic properties as well which can cause bradycardia, hypotension. -I explained this to the wife.  Stable to discharge to home today.  Subjective:  Seen and examined this morning.  Pleasant elderly demented African-American male.  He has been fed by staff.  Not in distress.  Discharge Exam:   Vitals:   08/11/19 1006 08/11/19 1328 08/11/19 1950 08/12/19 0511  BP: 133/77 133/77 126/73 110/75  Pulse: 75 69 69 (!) 55  Resp: 16 20 16 16   Temp:   98.6 F (37 C) 98 F (36.7 C)  TempSrc:   Oral Oral  SpO2:  99%    Weight:      Height:        Body mass index is 23.8 kg/m.  General  exam: Appears calm and comfortable.  Skin: No rashes, lesions or ulcers. HEENT: Atraumatic, normocephalic, supple neck, no obvious bleeding Lungs: Clear to auscultation bilaterally CVS: Regular rate and rhythm, no murmur GI/Abd soft, nontender, nondistended, bowel sound present CNS: Alert, awake, nods head to simple questions, not able to have a conversation because of dementia Psychiatry: Mood appropriate Extremities: No pedal edema, no calf tenderness  Discharge Instructions:  Wound care: None Discharge Instructions    Diet general   Complete by: As directed    Increase  activity slowly   Complete by: As directed       Allergies as of 08/12/2019   No Known Allergies     Medication List    STOP taking these medications   donepezil 23 MG Tabs tablet Commonly known as: ARICEPT   hydrOXYzine 25 MG tablet Commonly known as: ATARAX/VISTARIL     TAKE these medications   amLODipine 10 MG tablet Commonly known as: NORVASC Take 10 mg by mouth daily.   dexamethasone 6 MG tablet Commonly known as: Decadron Take 1 tablet (6 mg total) by mouth daily for 5 days.   feeding supplement (ENSURE ENLIVE) Liqd Take 237 mLs by mouth 2 (two) times daily between meals.   feeding supplement (PRO-STAT SUGAR FREE 64) Liqd Take 30 mLs by mouth 2 (two) times daily.   lisinopril 40 MG tablet Commonly known as: ZESTRIL Take 40 mg by mouth daily.   pantoprazole 40 MG tablet Commonly known as: PROTONIX Take 40 mg by mouth daily.            Durable Medical Equipment  (From admission, onward)         Start     Ordered   08/12/19 1155  DME Oxygen  Once    Question Answer Comment  Length of Need 6 Months   Mode or (Route) Nasal cannula   Liters per Minute 3   Frequency Continuous (stationary and portable oxygen unit needed)   Oxygen conserving device Yes   Oxygen delivery system Gas      08/12/19 1156          Time coordinating discharge: 35 minutes  The results of significant diagnostics from this hospitalization (including imaging, microbiology, ancillary and laboratory) are listed below for reference.    Procedures and Diagnostic Studies:   DG Chest Port 1 View  Result Date: 08/09/2019 CLINICAL DATA:  Syncope. EXAM: PORTABLE CHEST 1 VIEW COMPARISON:  05/13/2017. FINDINGS: Trachea is midline. Heart is enlarged, as before. Lungs are low in volume with minimal left basilar subsegmental atelectasis. No airspace consolidation or pleural fluid. Visualized upper abdomen is unremarkable. IMPRESSION: Low lung volumes.  No acute findings. Electronically  Signed   By: Lorin Picket M.D.   On: 08/09/2019 13:35   ECHOCARDIOGRAM COMPLETE  Result Date: 08/10/2019   ECHOCARDIOGRAM REPORT   [name] Date of Exam: 08/10/2019 Medical Rec #:  [id number]                  Height:       68.0 in Accession #:    [id number]                 Weight:       160.0 lb Date of Birth:  1941-05-14                  BSA:          1.86 m Patient Age:  78 years                   BP:           117/73 mmHg Patient Gender: M                          HR:           60 bpm. Exam Location:  Inpatient Procedure: 2D Echo, Color Doppler and Cardiac Doppler                                MODIFIED REPORT: This report was modified by Weston BrassGayatri Acharya MD on 08/10/2019 due to syngo error.  Indications:     R55 Syncope  History:         Patient has no prior history of Echocardiogram examinations.                  Risk Factors:Hypertension. COVID+ on 08/09/19.  Sonographer:     Irving BurtonEmily Senior RDCS Referring Phys:  Herminio [id number] VIJAYA AKULA Diagnosing Phys: Weston BrassGayatri Acharya MD  Sonographer Comments: No subcostal window. Technically difficult study due to altered mental status. IMPRESSIONS  1. Left ventricular ejection fraction, by visual estimation, is 60 to 65%. The left ventricle has normal function. Left ventricular septal wall thickness was mildly increased. There is no left ventricular hypertrophy.  2. Left ventricular diastolic parameters are consistent with Grade II diastolic dysfunction (pseudonormalization).  3. Global right ventricle has mildly reduced systolic function.The right ventricular size is normal. No increase in right ventricular wall thickness.  4. Left atrial size was normal.  5. Right atrial size was normal.  6. The mitral valve is normal in structure. Mild to moderate mitral valve regurgitation. No evidence of mitral stenosis.  7. The tricuspid valve is normal in structure.  8. The aortic valve is normal in structure. Aortic valve regurgitation is mild. No evidence  of aortic valve sclerosis or stenosis.  9. The pulmonic valve was grossly normal. Pulmonic valve regurgitation is trivial. 10. TR signal is inadequate for assessing pulmonary artery systolic pressure. FINDINGS  Left Ventricle: Left ventricular ejection fraction, by visual estimation, is 60 to 65%. The left ventricle has normal function. The left ventricle is not well visualized. There is no left ventricular hypertrophy. Left ventricular diastolic parameters are consistent with Grade II diastolic dysfunction (pseudonormalization). Indeterminate filling pressures. Right Ventricle: The right ventricular size is normal. No increase in right ventricular wall thickness. Global RV systolic function is has mildly reduced systolic function. Left Atrium: Left atrial size was normal in size. Right Atrium: Right atrial size was normal in size Pericardium: There is no evidence of pericardial effusion. Mitral Valve: The mitral valve is normal in structure. Mild to moderate mitral valve regurgitation. No evidence of mitral valve stenosis by observation. Tricuspid Valve: The tricuspid valve is normal in structure. Tricuspid valve regurgitation is trivial. Aortic Valve: The aortic valve is normal in structure. Aortic valve regurgitation is mild. The aortic valve is structurally normal, with no evidence of sclerosis or stenosis. Pulmonic Valve: The pulmonic valve was grossly normal. Pulmonic valve regurgitation is trivial. Pulmonic regurgitation is trivial. Aorta: The ascending aorta was not well visualized and the aortic root is normal in size and structure. Venous: The inferior vena cava was not well visualized. IAS/Shunts: The interatrial septum was not well visualized.  LEFT VENTRICLE PLAX 2D LVIDd:  4.70 cm  Diastology LVIDs:         4.10 cm  LV e' lateral:   7.83 cm/s LV PW:         1.00 cm  LV E/e' lateral: 8.1 LV IVS:        1.10 cm  LV e' medial:    5.55 cm/s LVOT diam:     2.00 cm  LV E/e' medial:  11.4 LV SV:          28 ml LV SV Index:   15.02 LVOT Area:     3.14 cm  RIGHT VENTRICLE RV S prime:     11.10 cm/s TAPSE (M-mode): 1.8 cm LEFT ATRIUM             Index       RIGHT ATRIUM          Index LA diam:        2.90 cm 1.56 cm/m  RA Area:     7.51 cm LA Vol (A2C):   56.3 ml 30.29 ml/m RA Volume:   11.70 ml 6.29 ml/m LA Vol (A4C):   38.1 ml 20.50 ml/m LA Biplane Vol: 46.2 ml 24.85 ml/m  AORTIC VALVE LVOT Vmax:   52.20 cm/s LVOT Vmean:  38.300 cm/s LVOT VTI:    0.110 m  AORTA Ao Root diam: 3.10 cm Ao Asc diam:  3.20 cm MITRAL VALVE MV Area (PHT): 3.21 cm             SHUNTS MV PHT:        68.44 msec           Systemic VTI:  0.11 m MV Decel Time: 236 msec             Systemic Diam: 2.00 cm MV E velocity: 63.40 cm/s 103 cm/s MV A velocity: 29.10 cm/s 70.3 cm/s MV E/A ratio:  2.18       1.5  Weston Brass MD Electronically signed by Weston Brass MD Signature Date/Time: 08/10/2019/3:08:31 PM    Final (Updated)      Labs:   Basic Metabolic Panel: Recent Labs  Lab 08/09/19 1338 08/10/19 0500 08/11/19 0336 08/12/19 0409  NA 143 140  --  136  K 4.7 4.4  --  4.7  CL 110 106  --  106  CO2 20* 23  --  18*  GLUCOSE 120* 85  --  106*  BUN 21 18  --  27*  CREATININE 1.95* 1.58*  --  1.48*  CALCIUM 9.0 8.3*  --  8.0*  MG  --   --  1.8 1.8   GFR Estimated Creatinine Clearance: 38.5 mL/min (A) (by C-G formula based on SCr of 1.48 mg/dL (H)). Liver Function Tests: Recent Labs  Lab 08/09/19 1338  AST 40  ALT 33  ALKPHOS 52  BILITOT 0.7  PROT 8.0  ALBUMIN 3.7   No results for input(s): LIPASE, AMYLASE in the last 168 hours. No results for input(s): AMMONIA in the last 168 hours. Coagulation profile Recent Labs  Lab 08/09/19 1338  INR 1.0    CBC: Recent Labs  Lab 08/09/19 1338 08/10/19 0500 08/11/19 0336 08/12/19 0409  WBC 8.0 5.3 7.4 7.9  NEUTROABS 5.6  --  6.1 5.5  HGB 15.4 15.0 15.3 13.7  HCT 52.6* 48.7 50.2 45.3  MCV 89.2 85.7 86.7 86.3  PLT 253 187 219 212   Cardiac Enzymes: No  results for input(s): CKTOTAL, CKMB, CKMBINDEX, TROPONINI in the last 168 hours.  BNP: Invalid input(s): POCBNP CBG: Recent Labs  Lab 08/09/19 1246  GLUCAP 93   D-Dimer Recent Labs    08/10/19 1730  DDIMER 0.67*   Hgb A1c No results for input(s): HGBA1C in the last 72 hours. Lipid Profile No results for input(s): CHOL, HDL, LDLCALC, TRIG, CHOLHDL, LDLDIRECT in the last 72 hours. Thyroid function studies Recent Labs    08/10/19 0827  TSH 1.347   Anemia work up Recent Labs    08/11/19 0336 08/12/19 0410  FERRITIN 156 206   Microbiology Recent Results (from the past 240 hour(s))  Respiratory Panel by RT PCR (Flu A&B, Covid) - Nasopharyngeal Swab     Status: Abnormal   Collection Time: 08/09/19  5:52 PM   Specimen: Nasopharyngeal Swab  Result Value Ref Range Status   SARS Coronavirus 2 by RT PCR POSITIVE (A) NEGATIVE Final    Comment: RESULT CALLED TO, READ BACK BY AND VERIFIED WITH: OXENDINE,J. RN @1938  ON 01.04.2021 BY COHEN,K (NOTE) SARS-CoV-2 target nucleic acids are DETECTED. SARS-CoV-2 RNA is generally detectable in upper respiratory specimens  during the acute phase of infection. Positive results are indicative of the presence of the identified virus, but do not rule out bacterial infection or co-infection with other pathogens not detected by the test. Clinical correlation with patient history and other diagnostic information is necessary to determine patient infection status. The expected result is Negative. Fact Sheet for Patients:  03.04.2021 Fact Sheet for Healthcare Providers: https://www.moore.com/ This test is not yet approved or cleared by the https://www.young.biz/ FDA and  has been authorized for detection and/or diagnosis of SARS-CoV-2 by FDA under an Emergency Use Authorization (EUA).  This EUA will remain in effect (meaning this test can  be used) for the duration of  the COVID-19 declaration under  Section 564(b)(1) of the Act, 21 U.S.C. section 360bbb-3(b)(1), unless the authorization is terminated or revoked sooner.    Influenza A by PCR NEGATIVE NEGATIVE Final   Influenza B by PCR NEGATIVE NEGATIVE Final    Comment: (NOTE) The Xpert Xpress SARS-CoV-2/FLU/RSV assay is intended as an aid in  the diagnosis of influenza from Nasopharyngeal swab specimens and  should not be used as a sole basis for treatment. Nasal washings and  aspirates are unacceptable for Xpert Xpress SARS-CoV-2/FLU/RSV  testing. Fact Sheet for Patients: [place] Fact Sheet for Healthcare Providers: https://www.moore.com/ This test is not yet approved or cleared by the https://www.young.biz/ FDA and  has been authorized for detection and/or diagnosis of SARS-CoV-2 by  FDA under an Emergency Use Authorization (EUA). This EUA will remain  in effect (meaning this test can be used) for the duration of the  Covid-19 declaration under Section 564(b)(1) of the Act, 21  U.S.C. section 360bbb-3(b)(1), unless the authorization is  terminated or revoked. Performed at [facility], [address], [place], [place]     Please note: You were cared for by a hospitalist during your hospital stay. Once you are discharged, your primary care physician will handle any further medical issues. Please note that NO REFILLS for any discharge medications will be authorized once you are discharged, as it is imperative that you return to your primary care physician (or establish a relationship with a primary care physician if you do not have one) for your post hospital discharge needs so that they can reassess your need for medications and monitor your lab values.  Signed: [id number] Davene Jobin  [facility] 08/12/2019, 12:00 PM

## 2019-08-12 NOTE — Progress Notes (Signed)
SATURATION QUALIFICATIONS: (This note is used to comply with regulatory documentation for home oxygen)  Patient Saturations on Room Air at Rest = 100%  Patient Saturations on Room Air while Ambulating = 87%  Patient Saturations on 1 Liters of oxygen while Ambulating = 98%  Please briefly explain why patient needs home oxygen: Covid

## 2019-08-13 DIAGNOSIS — G309 Alzheimer's disease, unspecified: Secondary | ICD-10-CM | POA: Diagnosis not present

## 2019-08-13 DIAGNOSIS — R55 Syncope and collapse: Secondary | ICD-10-CM | POA: Diagnosis not present

## 2019-08-13 DIAGNOSIS — M6281 Muscle weakness (generalized): Secondary | ICD-10-CM | POA: Diagnosis not present

## 2019-08-13 DIAGNOSIS — I10 Essential (primary) hypertension: Secondary | ICD-10-CM | POA: Diagnosis not present

## 2019-08-13 DIAGNOSIS — F0280 Dementia in other diseases classified elsewhere without behavioral disturbance: Secondary | ICD-10-CM | POA: Diagnosis not present

## 2019-08-13 DIAGNOSIS — U071 COVID-19: Secondary | ICD-10-CM | POA: Diagnosis not present

## 2019-08-16 ENCOUNTER — Other Ambulatory Visit: Payer: Self-pay

## 2019-08-16 DIAGNOSIS — G309 Alzheimer's disease, unspecified: Secondary | ICD-10-CM | POA: Diagnosis not present

## 2019-08-16 DIAGNOSIS — M6281 Muscle weakness (generalized): Secondary | ICD-10-CM | POA: Diagnosis not present

## 2019-08-16 DIAGNOSIS — I10 Essential (primary) hypertension: Secondary | ICD-10-CM | POA: Diagnosis not present

## 2019-08-16 DIAGNOSIS — F0280 Dementia in other diseases classified elsewhere without behavioral disturbance: Secondary | ICD-10-CM | POA: Diagnosis not present

## 2019-08-16 DIAGNOSIS — U071 COVID-19: Secondary | ICD-10-CM | POA: Diagnosis not present

## 2019-08-16 DIAGNOSIS — R55 Syncope and collapse: Secondary | ICD-10-CM | POA: Diagnosis not present

## 2019-08-16 NOTE — Patient Outreach (Signed)
Red Emmi:  Alert reason:  Follow up- NO Emmi Start date 08/14/19  Reviewed emmi alert. Placed call to patient and spoke with wife. Reviewed reason for call. Wife reports she made patient a follow up appointment with MD on 08/30/2019.  Reports patient is doing very well. Wife states pulse ox is good, Reports 2 therapist in to see patient today. Reports patient is eating and drinking well. Wife denies any new problems or concerns today.  PLAN: close case as no needs identified.   Rowe Pavy, RN, BSN, CEN [facility] [employer] [phone]

## 2019-08-17 DIAGNOSIS — G309 Alzheimer's disease, unspecified: Secondary | ICD-10-CM | POA: Diagnosis not present

## 2019-08-17 DIAGNOSIS — I10 Essential (primary) hypertension: Secondary | ICD-10-CM | POA: Diagnosis not present

## 2019-08-17 DIAGNOSIS — R55 Syncope and collapse: Secondary | ICD-10-CM | POA: Diagnosis not present

## 2019-08-17 DIAGNOSIS — U071 COVID-19: Secondary | ICD-10-CM | POA: Diagnosis not present

## 2019-08-17 DIAGNOSIS — F0280 Dementia in other diseases classified elsewhere without behavioral disturbance: Secondary | ICD-10-CM | POA: Diagnosis not present

## 2019-08-17 DIAGNOSIS — M6281 Muscle weakness (generalized): Secondary | ICD-10-CM | POA: Diagnosis not present

## 2019-08-19 ENCOUNTER — Other Ambulatory Visit: Payer: Self-pay

## 2019-08-19 ENCOUNTER — Encounter (HOSPITAL_COMMUNITY): Payer: Self-pay

## 2019-08-19 ENCOUNTER — Emergency Department (HOSPITAL_COMMUNITY): Payer: Medicare Other

## 2019-08-19 ENCOUNTER — Inpatient Hospital Stay (HOSPITAL_COMMUNITY)
Admission: EM | Admit: 2019-08-19 | Discharge: 2019-08-31 | DRG: 871 | Disposition: A | Discharge status: Skilled Nursing Facility | Payer: Medicare Other | Attending: Internal Medicine | Admitting: Internal Medicine

## 2019-08-19 DIAGNOSIS — E860 Dehydration: Secondary | ICD-10-CM | POA: Diagnosis present

## 2019-08-19 DIAGNOSIS — A4189 Other specified sepsis: Secondary | ICD-10-CM | POA: Diagnosis present

## 2019-08-19 DIAGNOSIS — Z7401 Bed confinement status: Secondary | ICD-10-CM | POA: Diagnosis not present

## 2019-08-19 DIAGNOSIS — R197 Diarrhea, unspecified: Secondary | ICD-10-CM | POA: Diagnosis present

## 2019-08-19 DIAGNOSIS — R6520 Severe sepsis without septic shock: Secondary | ICD-10-CM | POA: Diagnosis not present

## 2019-08-19 DIAGNOSIS — R339 Retention of urine, unspecified: Secondary | ICD-10-CM | POA: Diagnosis not present

## 2019-08-19 DIAGNOSIS — I129 Hypertensive chronic kidney disease with stage 1 through stage 4 chronic kidney disease, or unspecified chronic kidney disease: Secondary | ICD-10-CM | POA: Diagnosis present

## 2019-08-19 DIAGNOSIS — J9601 Acute respiratory failure with hypoxia: Secondary | ICD-10-CM | POA: Diagnosis present

## 2019-08-19 DIAGNOSIS — Z79899 Other long term (current) drug therapy: Secondary | ICD-10-CM

## 2019-08-19 DIAGNOSIS — A419 Sepsis, unspecified organism: Secondary | ICD-10-CM | POA: Diagnosis not present

## 2019-08-19 DIAGNOSIS — R0902 Hypoxemia: Secondary | ICD-10-CM | POA: Diagnosis not present

## 2019-08-19 DIAGNOSIS — N1330 Unspecified hydronephrosis: Secondary | ICD-10-CM | POA: Diagnosis not present

## 2019-08-19 DIAGNOSIS — R531 Weakness: Secondary | ICD-10-CM | POA: Diagnosis not present

## 2019-08-19 DIAGNOSIS — M2550 Pain in unspecified joint: Secondary | ICD-10-CM | POA: Diagnosis not present

## 2019-08-19 DIAGNOSIS — Z66 Do not resuscitate: Secondary | ICD-10-CM | POA: Diagnosis present

## 2019-08-19 DIAGNOSIS — N179 Acute kidney failure, unspecified: Secondary | ICD-10-CM | POA: Diagnosis present

## 2019-08-19 DIAGNOSIS — R2689 Other abnormalities of gait and mobility: Secondary | ICD-10-CM | POA: Diagnosis not present

## 2019-08-19 DIAGNOSIS — R2681 Unsteadiness on feet: Secondary | ICD-10-CM | POA: Diagnosis not present

## 2019-08-19 DIAGNOSIS — N281 Cyst of kidney, acquired: Secondary | ICD-10-CM | POA: Diagnosis not present

## 2019-08-19 DIAGNOSIS — F0280 Dementia in other diseases classified elsewhere without behavioral disturbance: Secondary | ICD-10-CM | POA: Diagnosis not present

## 2019-08-19 DIAGNOSIS — R0602 Shortness of breath: Secondary | ICD-10-CM | POA: Diagnosis not present

## 2019-08-19 DIAGNOSIS — G309 Alzheimer's disease, unspecified: Secondary | ICD-10-CM

## 2019-08-19 DIAGNOSIS — M6281 Muscle weakness (generalized): Secondary | ICD-10-CM | POA: Diagnosis not present

## 2019-08-19 DIAGNOSIS — E870 Hyperosmolality and hypernatremia: Secondary | ICD-10-CM | POA: Diagnosis not present

## 2019-08-19 DIAGNOSIS — R41841 Cognitive communication deficit: Secondary | ICD-10-CM | POA: Diagnosis not present

## 2019-08-19 DIAGNOSIS — N1831 Chronic kidney disease, stage 3a: Secondary | ICD-10-CM | POA: Diagnosis not present

## 2019-08-19 DIAGNOSIS — U071 COVID-19: Secondary | ICD-10-CM

## 2019-08-19 DIAGNOSIS — R000 Tachycardia, unspecified: Secondary | ICD-10-CM | POA: Diagnosis not present

## 2019-08-19 DIAGNOSIS — Z8249 Family history of ischemic heart disease and other diseases of the circulatory system: Secondary | ICD-10-CM | POA: Diagnosis not present

## 2019-08-19 DIAGNOSIS — G9341 Metabolic encephalopathy: Secondary | ICD-10-CM | POA: Diagnosis present

## 2019-08-19 DIAGNOSIS — N183 Chronic kidney disease, stage 3 unspecified: Secondary | ICD-10-CM | POA: Diagnosis not present

## 2019-08-19 DIAGNOSIS — R404 Transient alteration of awareness: Secondary | ICD-10-CM | POA: Diagnosis not present

## 2019-08-19 DIAGNOSIS — N170 Acute kidney failure with tubular necrosis: Secondary | ICD-10-CM | POA: Diagnosis not present

## 2019-08-19 DIAGNOSIS — Z23 Encounter for immunization: Secondary | ICD-10-CM | POA: Diagnosis not present

## 2019-08-19 DIAGNOSIS — E872 Acidosis: Secondary | ICD-10-CM | POA: Diagnosis not present

## 2019-08-19 DIAGNOSIS — R278 Other lack of coordination: Secondary | ICD-10-CM | POA: Diagnosis not present

## 2019-08-19 DIAGNOSIS — R1312 Dysphagia, oropharyngeal phase: Secondary | ICD-10-CM | POA: Diagnosis not present

## 2019-08-19 DIAGNOSIS — J1282 Pneumonia due to coronavirus disease 2019: Secondary | ICD-10-CM | POA: Diagnosis not present

## 2019-08-19 DIAGNOSIS — I10 Essential (primary) hypertension: Secondary | ICD-10-CM | POA: Diagnosis not present

## 2019-08-19 DIAGNOSIS — J1289 Other viral pneumonia: Secondary | ICD-10-CM | POA: Diagnosis not present

## 2019-08-19 DIAGNOSIS — R0689 Other abnormalities of breathing: Secondary | ICD-10-CM | POA: Diagnosis not present

## 2019-08-19 LAB — LACTIC ACID, PLASMA
Lactic Acid, Venous: 3.1 mmol/L (ref 0.5–1.9)
Lactic Acid, Venous: 2 mmol/L (ref 0.5–1.9)

## 2019-08-19 LAB — COMPREHENSIVE METABOLIC PANEL
ALT: 65 U/L — ABNORMAL HIGH (ref 0–44)
AST: 137 U/L — ABNORMAL HIGH (ref 15–41)
Albumin: 3 g/dL — ABNORMAL LOW (ref 3.5–5.0)
Alkaline Phosphatase: 51 U/L (ref 38–126)
Anion gap: 15 (ref 5–15)
BUN: 43 mg/dL — ABNORMAL HIGH (ref 8–23)
CO2: 23 mmol/L (ref 22–32)
Calcium: 8.6 mg/dL — ABNORMAL LOW (ref 8.9–10.3)
Chloride: 108 mmol/L (ref 98–111)
Creatinine, Ser: 2.43 mg/dL — ABNORMAL HIGH (ref 0.61–1.24)
GFR calc Af Amer: 28 mL/min — ABNORMAL LOW (ref >60)
GFR calc non Af Amer: 24 mL/min — ABNORMAL LOW (ref >60)
Glucose, Bld: 109 mg/dL — ABNORMAL HIGH (ref 70–99)
Potassium: 3.8 mmol/L (ref 3.5–5.1)
Sodium: 146 mmol/L — ABNORMAL HIGH (ref 135–145)
Total Bilirubin: 0.9 mg/dL (ref 0.3–1.2)
Total Protein: 7.7 g/dL (ref 6.5–8.1)

## 2019-08-19 LAB — CBC WITH DIFFERENTIAL/PLATELET
Abs Immature Granulocytes: 0.08 10*3/uL — ABNORMAL HIGH (ref 0.00–0.07)
Basophils Absolute: 0 10*3/uL (ref 0.0–0.1)
Basophils Relative: 0 %
Eosinophils Absolute: 0 10*3/uL (ref 0.0–0.5)
Eosinophils Relative: 0 %
HCT: 51.8 % (ref 39.0–52.0)
Hemoglobin: 15.9 g/dL (ref 13.0–17.0)
Immature Granulocytes: 1 %
Lymphocytes Relative: 7 %
Lymphs Abs: 0.8 10*3/uL (ref 0.7–4.0)
MCH: 26.2 pg (ref 26.0–34.0)
MCHC: 30.7 g/dL (ref 30.0–36.0)
MCV: 85.2 fL (ref 80.0–100.0)
Monocytes Absolute: 0.8 10*3/uL (ref 0.1–1.0)
Monocytes Relative: 7 %
Neutro Abs: 10.5 10*3/uL — ABNORMAL HIGH (ref 1.7–7.7)
Neutrophils Relative %: 85 %
Platelets: 307 10*3/uL (ref 150–400)
RBC: 6.08 MIL/uL — ABNORMAL HIGH (ref 4.22–5.81)
RDW: 15.7 % — ABNORMAL HIGH (ref 11.5–15.5)
WBC: 12.2 10*3/uL — ABNORMAL HIGH (ref 4.0–10.5)
nRBC: 0 % (ref 0.0–0.2)

## 2019-08-19 LAB — BRAIN NATRIURETIC PEPTIDE: B Natriuretic Peptide: 330.2 pg/mL — ABNORMAL HIGH (ref 0.0–100.0)

## 2019-08-19 MED ORDER — ZOLPIDEM TARTRATE 5 MG PO TABS: 5.0000 mg | ORAL_TABLET | Freq: Every evening | ORAL | Status: DC | PRN

## 2019-08-19 MED ORDER — CALCIUM CARBONATE ANTACID 1250 MG/5ML PO SUSP
500.0000 mg | Freq: Four times a day (QID) | ORAL | Status: DC | PRN
  Filled 2019-08-19: qty 5

## 2019-08-19 MED ORDER — ACETAMINOPHEN 650 MG RE SUPP
650.0000 mg | Freq: Four times a day (QID) | RECTAL | Status: DC | PRN
  Administered 2019-08-21: 650 mg via RECTAL
  Filled 2019-08-19 (×2): qty 1

## 2019-08-19 MED ORDER — NEPRO/CARBSTEADY PO LIQD
237.0000 mL | Freq: Three times a day (TID) | ORAL | Status: DC | PRN
  Filled 2019-08-19: qty 237

## 2019-08-19 MED ORDER — HEPARIN SODIUM (PORCINE) 5000 UNIT/ML IJ SOLN
5000.0000 [IU] | Freq: Three times a day (TID) | INTRAMUSCULAR | Status: DC
  Administered 2019-08-19 – 2019-08-31 (×36): 5000 [IU] via SUBCUTANEOUS
  Filled 2019-08-19 (×36): qty 1

## 2019-08-19 MED ORDER — ACETAMINOPHEN 325 MG PO TABS
650.0000 mg | ORAL_TABLET | Freq: Four times a day (QID) | ORAL | Status: DC | PRN
  Administered 2019-08-21 – 2019-08-23 (×2): 650 mg via ORAL
  Filled 2019-08-19 (×3): qty 2

## 2019-08-19 MED ORDER — SODIUM CHLORIDE 0.9 % IV SOLN
INTRAVENOUS | Status: DC
  Administered 2019-08-19: 22:00:00 125 mL/h via INTRAVENOUS

## 2019-08-19 MED ORDER — ONDANSETRON HCL 4 MG PO TABS: 4.0000 mg | ORAL_TABLET | Freq: Four times a day (QID) | ORAL | Status: DC | PRN

## 2019-08-19 MED ORDER — SODIUM CHLORIDE 0.9 % IV SOLN
1.0000 g | INTRAVENOUS | Status: DC
  Administered 2019-08-19 – 2019-08-21 (×3): 1 g via INTRAVENOUS
  Filled 2019-08-19: qty 10
  Filled 2019-08-19 (×2): qty 1

## 2019-08-19 MED ORDER — HYDROXYZINE HCL 25 MG PO TABS
25.0000 mg | ORAL_TABLET | Freq: Three times a day (TID) | ORAL | Status: DC | PRN
  Filled 2019-08-19: qty 1

## 2019-08-19 MED ORDER — AMLODIPINE BESYLATE 10 MG PO TABS
10.0000 mg | ORAL_TABLET | Freq: Every day | ORAL | Status: DC
  Administered 2019-08-19 – 2019-08-22 (×4): 10 mg via ORAL
  Filled 2019-08-19: qty 2
  Filled 2019-08-19 (×3): qty 1

## 2019-08-19 MED ORDER — SORBITOL 70 % SOLN
30.0000 mL | Status: DC | PRN
  Filled 2019-08-19: qty 30

## 2019-08-19 MED ORDER — ONDANSETRON HCL 4 MG/2ML IJ SOLN: 4.0000 mg | Freq: Four times a day (QID) | INTRAMUSCULAR | Status: DC | PRN

## 2019-08-19 MED ORDER — SODIUM CHLORIDE 0.9 % IV BOLUS
1000.0000 mL | Freq: Once | INTRAVENOUS | Status: AC
  Administered 2019-08-19: 17:00:00 1000 mL via INTRAVENOUS

## 2019-08-19 MED ORDER — ACETAMINOPHEN 500 MG PO TABS
ORAL_TABLET | ORAL | Status: AC
  Administered 2019-08-19: 16:00:00 1000 mg
  Filled 2019-08-19: qty 2

## 2019-08-19 MED ORDER — DOCUSATE SODIUM 283 MG RE ENEM
1.0000 | ENEMA | RECTAL | Status: DC | PRN
  Filled 2019-08-19: qty 1

## 2019-08-19 MED ORDER — CAMPHOR-MENTHOL 0.5-0.5 % EX LOTN
1.0000 "application " | TOPICAL_LOTION | Freq: Three times a day (TID) | CUTANEOUS | Status: DC | PRN
  Filled 2019-08-19: qty 222

## 2019-08-19 NOTE — ED Provider Notes (Addendum)
[facility]-EMERGENCY DEPT Provider Note   CSN: [id number] Arrival date & time: 08/19/19  1509     History Chief Complaint  Patient presents with  . Weakness  . COVID Positive    Donald [name] is a 79 y.o. male.  HPI    Patient with history of dementia, nonverbal status presents from home with family concerns of weakness. Patient himself cannot provide any details of his history.  Level 5 caveat. Reportedly patient recently had Covid diagnosis, admission, and was discharged. The patient may have had fever, weakness, but no family members are available to provide additional details. Past Medical History:  Diagnosis Date  . Dementia (HCC)   . Hypertension     Patient Active Problem List   Diagnosis Date Noted  . Near syncope 08/10/2019  . Syncope 08/09/2019  . AKI (acute kidney injury) (HCC) 08/09/2019  . Dementia with behavioral disturbance (HCC) 12/15/2015  . Tonic seizure (HCC) 12/15/2015  . Altered mental status 10/29/2015  . Elevated lactic acid level 10/29/2015  . Encephalopathy acute 10/29/2015  . Enlarged prostate 10/29/2015  . Hypertension 10/29/2015  . 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 NO PAST SURGERIES         Family History  Problem Relation Age of Onset  . Hypertension Mother   . Dementia Sister     Social History   Tobacco Use  . Smoking status: Never Smoker  . Smokeless tobacco: Never Used  Substance Use Topics  . Alcohol use: No    Alcohol/week: 0.0 standard drinks  . Drug use: No    Home Medications Prior to Admission medications   Medication Sig Start Date End Date Taking? Authorizing Provider  Amino Acids-Protein Hydrolys (FEEDING SUPPLEMENT, PRO-STAT SUGAR FREE 64,) LIQD Take 30 mLs by mouth 2 (two) times daily. 08/12/19  Yes Dahal, Melina Schools, MD  amLODipine (NORVASC) 10 MG tablet Take 10 mg by mouth daily.    Yes [provider]  azithromycin (ZITHROMAX) 250 MG tablet Take 1-2 tablets by mouth as directed. Started 1.12.2021 took 500mg  now 250mg  will end 1.16.2021 08/17/19  Yes [provider]  dexamethasone (DECADRON) 2 MG tablet Take 6 mg by mouth daily. 08/12/19  Yes [provider]  feeding supplement, ENSURE ENLIVE, (ENSURE ENLIVE) LIQD Take 237 mLs by mouth 2 (two) times daily between meals. 08/12/19  Yes Dahal, 10/10/19, MD  lisinopril (ZESTRIL) 40 MG tablet Take 40 mg by mouth daily. 01/05/19  Yes [provider]  pantoprazole (PROTONIX) 40 MG tablet Take 40 mg by mouth daily. 06/02/19  Yes [provider]    Allergies    Patient has no known allergies.  Review of Systems   Review of Systems  Unable to perform ROS: Dementia    Physical Exam Updated Vital Signs BP (!) 160/144   Pulse (!) 110   Temp (!) 101.7 F (38.7 C)   Resp (!) 22   SpO2 95%   Physical Exam Vitals and nursing note reviewed.  Constitutional:      General: He is not in acute distress.    Appearance: He is well-developed.     Comments: Thin adult male awake and alert, though nonverbal, intermittently participatory.  HENT:     Head: Normocephalic and atraumatic.  Eyes:     Conjunctiva/sclera: Conjunctivae normal.  Cardiovascular:     Rate and Rhythm: Regular rhythm. Tachycardia present.  Pulmonary:     Effort:  Pulmonary effort is normal. No respiratory distress.     Breath sounds: No stridor.  Abdominal:     General: There is no distension.  Skin:    General: Skin is warm and dry.  Neurological:     Mental Status: He is alert.     Motor: Weakness, tremor, atrophy and abnormal muscle tone present.     Comments: Patient follows some neurologic commands, including lifting declines, trying to uncross his legs, but is nonverbal, and is only intermittently participatory in the exam.  Psychiatric:        Cognition and Memory: Cognition is impaired.     ED Results / Procedures /  Treatments   Labs (all labs ordered are listed, but only abnormal results are displayed) Labs Reviewed  COMPREHENSIVE METABOLIC PANEL - Abnormal; Notable for the following components:      Result Value   Sodium 146 (*)    Glucose, Bld 109 (*)    BUN 43 (*)    Creatinine, Ser 2.43 (*)    Calcium 8.6 (*)    Albumin 3.0 (*)    AST 137 (*)    ALT 65 (*)    GFR calc non Af Amer 24 (*)    GFR calc Af Amer 28 (*)    All other components within normal limits  CBC WITH DIFFERENTIAL/PLATELET - Abnormal; Notable for the following components:   WBC 12.2 (*)    RBC 6.08 (*)    RDW 15.7 (*)    Neutro Abs 10.5 (*)    Abs Immature Granulocytes 0.08 (*)    All other components within normal limits  BRAIN NATRIURETIC PEPTIDE - Abnormal; Notable for the following components:   B Natriuretic Peptide 330.2 (*)    All other components within normal limits  LACTIC ACID, PLASMA - Abnormal; Notable for the following components:   Lactic Acid, Venous 3.1 (*)    All other components within normal limits  URINALYSIS, ROUTINE W REFLEX MICROSCOPIC  LACTIC ACID, PLASMA    EKG EKG Interpretation  Date/Time:  Thursday August 19 2019 15:28:07 EST Ventricular Rate:  112 PR Interval:    QRS Duration: 88 QT Interval:  303 QTC Calculation: 414 R Axis:   24 Text Interpretation: Sinus tachycardia Artifact Abnormal ECG Confirmed by Carmin Muskrat [phone]) on 08/19/2019 3:50:54 PM   Radiology DG Chest Port 1 View  Result Date: 08/19/2019 CLINICAL DATA:  Pt has hx of dementia. Pt's wife stated that after d/c, pt has gotten weaker. Wife believes it is a medication reaction. Pt is taking decadron and z pack. Pt recently d/c behavioral medication during stay at [facility]. EXAM: PORTABLE CHEST 1 VIEW COMPARISON:  08/09/2019 FINDINGS: Cardiac silhouette normal in size.  No mediastinal or hilar masses. Mild linear opacities are noted at the lung bases most likely atelectasis. These findings are similar to prior exam again  accentuated by low lung volumes. Remainder of the lungs is clear. No pleural effusion or pneumothorax. Skeletal structures are grossly intact. IMPRESSION: No acute cardiopulmonary disease. Electronically Signed   By: Lajean Manes M.D.   On: 08/19/2019 16:21    Procedures Procedures (including critical care time)  Medications Ordered in ED Medications  acetaminophen (TYLENOL) 500 MG tablet (1,000 mg  Given 08/19/19 1551)  sodium chloride 0.9 % bolus 1,000 mL (1,000 mLs Intravenous New Bag/Given (Non-Interop) 08/19/19 1657)    ED Course  I have reviewed the triage vital signs and the nursing notes.  Pertinent labs & imaging results that were  available during my care of the patient were reviewed by me and considered in my medical decision making (see chart for details).  Review after initial evaluation notable for discharge 1 week ago following admission 3 days prior for syncope Patient known of Covid, AKI, elevated troponins during that hospitalization.    MDM Rules/Calculators/A&P                      6:48 PM Labs notable for acute kidney injury, hypernatremia.  On patient volume BUN 43, mild elevation in transaminase.  Patient had a lactic acidosis on arrival as well as leukocytosis. Patient received initial fluids here, as he was clinically dehydrated, will continue to require these. Patient is Covid positive status, last result 10 days ago. With concern for Covid positive status, acute kidney injury, patient will require admission for further monitoring, management.  Attempts to contact the patient's wife were unsuccessful. Final Clinical Impression(s) / ED Diagnoses Final diagnoses:  COVID-19 virus infection  AKI (acute kidney injury) (HCC)     Gerhard Munch, MD 08/19/19 1852  CRITICAL CARE Performed by: Gerhard Munch Total critical care time: 35 minutes Critical care time was exclusive of separately billable procedures and treating other patients. Critical care was  necessary to treat or prevent imminent or life-threatening deterioration. Critical care was time spent personally by me on the following activities: development of treatment plan with patient and/or surrogate as well as nursing, discussions with consultants, evaluation of patient's response to treatment, examination of patient, obtaining history from patient or surrogate, ordering and performing treatments and interventions, ordering and review of laboratory studies, ordering and review of radiographic studies, pulse oximetry and re-evaluation of patient's condition.    Gerhard Munch, MD 09/03/19 [phone]

## 2019-08-19 NOTE — ED Notes (Signed)
Date and time results received: 08/19/19 4:47 PM (use smartphrase ".now" to insert current time)  Test: Lactic Critical Value: 3.1  Name of Provider Notified: Jeraldine Loots  Orders Received? Or Actions Taken?: Orders Received - See Orders for details

## 2019-08-19 NOTE — H&P (Signed)
History and Physical   Donald [name] JKK:[id number] DOB: [birth year] DOA: 08/19/2019  Referring MD/NP/PA: Dr. Jeraldine Loots  PCP: Pearson Grippe, MD   Outpatient Specialists: None  Patient coming from: Home  Chief Complaint: Weakness and dehydration  HPI: Donald [name] is a 79 y.o. male with medical history significant of dementia, hypertension, recent COVID-19 infection for which patient was admitted and discharged 08/09/2019 2 08/12/2019 at which point he had syncope acute kidney injury as well as shortness of breath.  Patient was discharged to follow-up with PCP.  He has apparently been weak and debilitated since discharge.  He was brought back today by wife due to worsening symptoms especially weakness.  Patient is unable to give adequate history.  He was found to be tachypneic but no evidence of pneumonia.  Patient noted to have acute kidney injury.  He has had decreased oral intake since discharge.  Appears to be dehydrated.  Patient is therefore being admitted with early sepsis especially with fever most likely post Covid syndrome with early pneumonia or UTI..  ED Course: Temperature 102.9 blood pressure 160/144 pulse 122 respirate of 32 oxygen sats 92% on room air.  Sodium 146 potassium 3.8 chloride 108 CO2 23 with glucose 109 BUN is 43 creatinine 2.43 calcium 8.6 AST 137 ALT 65.  Lactic acid 3.1.  White count is 12.2 hemoglobin 15.9 platelets 307.  Chest x-ray showed no acute findings.  Urinalysis currently pending.  Patient being admitted with acute renal failure with early sepsis.  Review of Systems: As per HPI otherwise 10 point review of systems negative.    Past Medical History:  Diagnosis Date  . Dementia (HCC)   . Hypertension     Past Surgical History:  Procedure Laterality Date  . NO PAST SURGERIES       reports that he has never smoked. He has never used smokeless tobacco. He reports that he does not drink alcohol or use drugs.  No Known  Allergies  Family History  Problem Relation Age of Onset  . Hypertension Mother   . Dementia Sister      Prior to Admission medications   Medication Sig Start Date End Date Taking? Authorizing Provider  Amino Acids-Protein Hydrolys (FEEDING SUPPLEMENT, PRO-STAT SUGAR FREE 64,) LIQD Take 30 mLs by mouth 2 (two) times daily. 08/12/19  Yes Dahal, Melina Schools, MD  amLODipine (NORVASC) 10 MG tablet Take 10 mg by mouth daily.    Yes [provider]  azithromycin (ZITHROMAX) 250 MG tablet Take 1-2 tablets by mouth as directed. Started 1.12.2021 took 500mg  now 250mg  will end 1.16.2021 08/17/19  Yes [provider]  dexamethasone (DECADRON) 2 MG tablet Take 6 mg by mouth daily. 08/12/19  Yes [provider]  feeding supplement, ENSURE ENLIVE, (ENSURE ENLIVE) LIQD Take 237 mLs by mouth 2 (two) times daily between meals. 08/12/19  Yes Dahal, 10/10/19, MD  lisinopril (ZESTRIL) 40 MG tablet Take 40 mg by mouth daily. 01/05/19  Yes [provider]  pantoprazole (PROTONIX) 40 MG tablet Take 40 mg by mouth daily. 06/02/19  Yes [provider]    Physical Exam: Vitals:   08/19/19 1730 08/19/19 1800 08/19/19 1801 08/19/19 1831  BP: (!) 141/94 137/83  (!) 160/144  Pulse: (!) 115 (!) 101  (!) 110  Resp: (!) 25 (!) 24  (!) 22  Temp:      TempSrc:      SpO2: 97% 92% 97% 95%      Constitutional: Confused, laying still no acute distress  Vitals:   08/19/19 1730 08/19/19 1800 08/19/19 1801 08/19/19 1831  BP: (!) 141/94 137/83  (!) 160/144  Pulse: (!) 115 (!) 101  (!) 110  Resp: (!) 25 (!) 24  (!) 22  Temp:      TempSrc:      SpO2: 97% 92% 97% 95%   Eyes: PERRL, lids and conjunctivae normal ENMT: Mucous membranes are dry. Posterior pharynx clear of any exudate or lesions.Normal dentition.  Neck: normal, supple, no masses, no thyromegaly Respiratory: clear to auscultation bilaterally, no wheezing, no crackles. Normal respiratory effort. No accessory muscle use.   Cardiovascular: Sinus tachycardia, no murmurs / rubs / gallops. No extremity edema. 2+ pedal pulses. No carotid bruits.  Abdomen: no tenderness, no masses palpated. No hepatosplenomegaly. Bowel sounds positive.  Musculoskeletal: no clubbing / cyanosis. No joint deformity upper and lower extremities. Good ROM, no contractures. Normal muscle tone.  Skin: no rashes, lesions, ulcers. No induration Neurologic: CN 2-12 grossly intact. Sensation intact, DTR normal. Strength 5/5 in all 4.  Psychiatric: Confused, disoriented, not giving adequate history.     Labs on Admission: I have personally reviewed following labs and imaging studies  CBC: Recent Labs  Lab 08/19/19 1605  WBC 12.2*  NEUTROABS 10.5*  HGB 15.9  HCT 51.8  MCV 85.2  PLT 307   Basic Metabolic Panel: Recent Labs  Lab 08/19/19 1605  NA 146*  K 3.8  CL 108  CO2 23  GLUCOSE 109*  BUN 43*  CREATININE 2.43*  CALCIUM 8.6*   GFR: Estimated Creatinine Clearance: 23.5 mL/min (A) (by C-G formula based on SCr of 2.43 mg/dL (H)). Liver Function Tests: Recent Labs  Lab 08/19/19 1605  AST 137*  ALT 65*  ALKPHOS 51  BILITOT 0.9  PROT 7.7  ALBUMIN 3.0*   No results for input(s): LIPASE, AMYLASE in the last 168 hours. No results for input(s): AMMONIA in the last 168 hours. Coagulation Profile: No results for input(s): INR, PROTIME in the last 168 hours. Cardiac Enzymes: No results for input(s): CKTOTAL, CKMB, CKMBINDEX, TROPONINI in the last 168 hours. BNP (last 3 results) No results for input(s): PROBNP in the last 8760 hours. HbA1C: No results for input(s): HGBA1C in the last 72 hours. CBG: No results for input(s): GLUCAP in the last 168 hours. Lipid Profile: No results for input(s): CHOL, HDL, LDLCALC, TRIG, CHOLHDL, LDLDIRECT in the last 72 hours. Thyroid Function Tests: No results for input(s): TSH, T4TOTAL, FREET4, T3FREE, THYROIDAB in the last 72 hours. Anemia Panel: No results for input(s): VITAMINB12,  FOLATE, FERRITIN, TIBC, IRON, RETICCTPCT in the last 72 hours. Urine analysis:    Component Value Date/Time   COLORURINE AMBER (A) 09/18/2018 2355   APPEARANCEUR HAZY (A) 09/18/2018 2355   LABSPEC 1.023 09/18/2018 2355   PHURINE 5.0 09/18/2018 2355   GLUCOSEU NEGATIVE 09/18/2018 2355   HGBUR NEGATIVE 09/18/2018 2355   BILIRUBINUR MODERATE (A) 09/18/2018 2355   KETONESUR NEGATIVE 09/18/2018 2355   PROTEINUR 30 (A) 09/18/2018 2355   UROBILINOGEN 0.2 02/02/2014 1804   NITRITE NEGATIVE 09/18/2018 2355   LEUKOCYTESUR NEGATIVE 09/18/2018 2355   Sepsis Labs: @LABRCNTIP (procalcitonin:4,lacticidven:4) )No results found for this or any previous visit (from the past 240 hour(s)).   Radiological Exams on Admission: DG Chest Port 1 View  Result Date: 08/19/2019 CLINICAL DATA:  Pt has hx of dementia. Pt's wife stated that after d/c, pt has gotten weaker. Wife believes it is a medication reaction. Pt is taking decadron and z pack. Pt recently d/c behavioral medication  during stay at [facility]. EXAM: PORTABLE CHEST 1 VIEW COMPARISON:  08/09/2019 FINDINGS: Cardiac silhouette normal in size.  No mediastinal or hilar masses. Mild linear opacities are noted at the lung bases most likely atelectasis. These findings are similar to prior exam again accentuated by low lung volumes. Remainder of the lungs is clear. No pleural effusion or pneumothorax. Skeletal structures are grossly intact. IMPRESSION: No acute cardiopulmonary disease. Electronically Signed   By: Lajean Manes M.D.   On: 08/19/2019 16:21    EKG: Independently reviewed.  It shows sinus tachycardia with a rate of 112.  No significant ST changes.  Assessment/Plan Active Problems:   ARF (acute renal failure) (HCC)     #1 acute kidney failure: BUN/creatinine elevated.  Most likely prerenal.  Patient was having good oral intake.  Admit the patient.  Hydrate the patient.  PT OT and speech evaluation.  Patient most likely will require skilled placement  if not significantly improved.  #2 recent Covid pneumonia: Patient has no infiltrates on his chest x-ray.  Does not seem to have respiratory symptoms.  I will give him some Decadron due to overall symptoms but will not treat for pneumonia.  #3 fever with leukocytosis: Follow-up on urinalysis.  Treat empirically for early sepsis.  Could still be early pneumonia.  #4 dementia: Patient has dementia but no significant agitation.  Wife is available for communication  #5 hypertension: Continue home regimen once confirmed   DVT prophylaxis: Heparin Code Status: DNR Family Communication: Wife over the phone Disposition Plan: To be determined Consults called: None Admission status: Inpatient  Severity of Illness: The appropriate patient status for this patient is INPATIENT. Inpatient status is judged to be reasonable and necessary in order to provide the required intensity of service to ensure the patient's safety. The patient's presenting symptoms, physical exam findings, and initial radiographic and laboratory data in the context of their chronic comorbidities is felt to place them at high risk for further clinical deterioration. Furthermore, it is not anticipated that the patient will be medically stable for discharge from the hospital within 2 midnights of admission. The following factors support the patient status of inpatient.   " The patient's presenting symptoms include weakness. " The worrisome physical exam findings include confusion. " The initial radiographic and laboratory data are worrisome because of BUN/creatinine elevated. " The chronic co-morbidities include Alzheimer's dementia.   * I certify that at the point of admission it is my clinical judgment that the patient will require inpatient hospital care spanning beyond 2 midnights from the point of admission due to high intensity of service, high risk for further deterioration and high frequency of surveillance  required.Barbette Merino MD [facility] Pager [phone]  If 7PM-7AM, please contact night-coverage [email or web address] Password [facility]  08/19/2019, 7:32 PM

## 2019-08-19 NOTE — ED Triage Notes (Signed)
Pt BIBA from home. Pt has hx of dementia. Pt's wife stated that after d/c, pt has gotten weaker. Wife believes it is a medication reaction. Pt is taking decadron and z pack.  Pt recently d/c behavioral medication during stay at [facility].  Pt wears O2 at night only.

## 2019-08-19 NOTE — ED Notes (Signed)
Updated wife, [name], [phone]

## 2019-08-20 LAB — COMPREHENSIVE METABOLIC PANEL
ALT: 59 U/L — ABNORMAL HIGH (ref 0–44)
AST: 137 U/L — ABNORMAL HIGH (ref 15–41)
Albumin: 2.8 g/dL — ABNORMAL LOW (ref 3.5–5.0)
Alkaline Phosphatase: 53 U/L (ref 38–126)
Anion gap: 15 (ref 5–15)
BUN: 47 mg/dL — ABNORMAL HIGH (ref 8–23)
CO2: 19 mmol/L — ABNORMAL LOW (ref 22–32)
Calcium: 8.3 mg/dL — ABNORMAL LOW (ref 8.9–10.3)
Chloride: 113 mmol/L — ABNORMAL HIGH (ref 98–111)
Creatinine, Ser: 3.27 mg/dL — ABNORMAL HIGH (ref 0.61–1.24)
GFR calc Af Amer: 20 mL/min — ABNORMAL LOW (ref >60)
GFR calc non Af Amer: 17 mL/min — ABNORMAL LOW (ref >60)
Glucose, Bld: 124 mg/dL — ABNORMAL HIGH (ref 70–99)
Potassium: 4 mmol/L (ref 3.5–5.1)
Sodium: 147 mmol/L — ABNORMAL HIGH (ref 135–145)
Total Bilirubin: 0.9 mg/dL (ref 0.3–1.2)
Total Protein: 7.4 g/dL (ref 6.5–8.1)

## 2019-08-20 LAB — CBC
HCT: 54.1 % — ABNORMAL HIGH (ref 39.0–52.0)
Hemoglobin: 16.1 g/dL (ref 13.0–17.0)
MCH: 25.8 pg — ABNORMAL LOW (ref 26.0–34.0)
MCHC: 29.8 g/dL — ABNORMAL LOW (ref 30.0–36.0)
MCV: 86.7 fL (ref 80.0–100.0)
Platelets: 292 10*3/uL (ref 150–400)
RBC: 6.24 MIL/uL — ABNORMAL HIGH (ref 4.22–5.81)
RDW: 15.4 % (ref 11.5–15.5)
WBC: 16.9 10*3/uL — ABNORMAL HIGH (ref 4.0–10.5)
nRBC: 0 % (ref 0.0–0.2)

## 2019-08-20 MED ORDER — SODIUM CHLORIDE 0.9 % IV BOLUS
500.0000 mL | Freq: Once | INTRAVENOUS | Status: AC
  Administered 2019-08-20: 500 mL via INTRAVENOUS

## 2019-08-20 NOTE — Plan of Care (Signed)
Discussed care plan with Donald [name] his wife.

## 2019-08-20 NOTE — Consult Note (Signed)
   [facility] Inpatient Consult   08/20/2019  Jakson Delpilar [place] 05/19/1941 [id number]   Patient chart has been reviewed for readmissions less than 30 days.  Patient assessed for community [facility] Care Management follow up needs.   Chart review reveals patient from home where lives with wife. Will continue to follow for progression and disposition plans.  Christophe Louis, MSN, RN [facility] Liaison Nurse Mobile Phone [phone]  Toll free office [phone]

## 2019-08-20 NOTE — Progress Notes (Signed)
After failed attempts to get UA related to pt's incontinence. Attempted to get UA via straight cath. Pt was restless and fighting against staff. Couldn't not obtain specimen. Hematuria noted with incontinence after straight cath attempt. MD aware. Monitoring output. Wife updated over phone. Pt remains in bed with call light within reach. Bed low with alarm on. VSS. Will cont to mx.

## 2019-08-20 NOTE — Progress Notes (Signed)
PROGRESS NOTE    Donald [name]  EUM:[id number] DOB: 1941/03/22 DOA: 08/19/2019 PCP: Jani Gravel, MD    Brief Narrative: Donald [name] is a 79 y.o. male with medical history significant of dementia, hypertension, recent COVID-33 infection for which patient was admitted and discharged 08/09/2019 2 08/12/2019 at which point he had syncope acute kidney injury as well as shortness of breath.  Patient was discharged to follow-up with PCP.  He has apparently been weak and debilitated since discharge.  He was brought back today by wife due to worsening symptoms especially weakness.  Patient is unable to give adequate history.  He was found to be tachypneic but no evidence of pneumonia.  Patient noted to have acute kidney injury.  He has had decreased oral intake since discharge.  Appears to be dehydrated.  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  Acute on chronic renal failure 2.  Pneumonia due to COVID-19 virus infection without respiratory symptoms 3.  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  Hypertension 5.  Diarrhea  1.  Acute on chronic renal failure.  Baseline creatinine 6 months ago was 1.69.  Serum creatinine at 2.43 and trending up. Continue with IV fluid hydration Avoid nephrotoxic medications Monitor renal function  2.  Pneumonia due to COVID-19 virus infection without respiratory symptoms.  Patient saturating above 90% on room air Continue bronchodilators Oxygen supplementation as needed Started on empiric antibiotics.  Will likely de-escalate  3.  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   Essential hypertension.  Currently normotensive We will continue with amlodipine  5.  Diarrhea.  Concern for possible C. difficile colitis We will obtain C. difficile assay if stool is appropriate  DVT prophylaxis: Heparin subcutaneous  Code Status: DNR Family Communication: None Disposition Plan: Patient lives at home with wife and will likely be discharged back to facility/SNF. Barrier to discharge includes recurrent readmission.   Consultants:   None  Procedures: None  Antimicrobials:  Ceftriaxone Azithromycin  Subjective: Patient was seen and examined at bedside.  Not in acute distress. Patient has significant cognitive impairment due to Alzheimer's dementia Not verbalizing  Objective: Vitals:   08/20/19 0334 08/20/19 0500 08/20/19 1357 08/20/19 1400  BP: (!) 133/99  139/80   Pulse: 100  (!) 108   Resp: 20  18   Temp: 98.2 F (36.8 C)  98.4 F (36.9 C) 98.4 F (36.9 C)  TempSrc: Oral  Oral Oral  SpO2: 95%  96%   Weight:  68.8 kg    Height:        Intake/Output Summary (Last 24 hours) at 08/20/2019 1833 Last data filed at 08/20/2019 1500 Gross per 24 hour  Intake 3281.25 ml  Output --  Net 3281.25 ml   Filed Weights   08/19/19 2251 08/20/19 0500  Weight: 69.5 kg 68.8 kg    Examination:  General exam: Appears calm and comfortable and not in acute distress Respiratory system: Clear to auscultation. Respiratory effort normal. Cardiovascular system: S1 & S2 heard, RRR. No JVD, murmurs, rubs, gallops or clicks. No pedal edema. Gastrointestinal system: Abdomen is nondistended, soft and nontender. No organomegaly or masses felt. Normal bowel sounds heard. Central  nervous system: Alert but not oriented x3 Extremities: Bilateral lower extremity weakness  Skin: No rashes, lesions or ulcers Psychiatry: Nonverbal   Data Reviewed: I have personally reviewed following labs and imaging studies  CBC: Recent Labs  Lab 08/19/19 1605 08/20/19 0307  WBC 12.2*  16.9*  NEUTROABS 10.5*  --   HGB 15.9 16.1  HCT 51.8 54.1*  MCV 85.2 86.7  PLT 307 292   Basic Metabolic Panel: Recent Labs  Lab 08/19/19 1605 08/20/19 0307  NA 146* 147*  K 3.8 4.0  CL 108 113*  CO2 23 19*  GLUCOSE 109* 124*  BUN 43* 47*  CREATININE 2.43* 3.27*  CALCIUM 8.6* 8.3*   GFR: Estimated Creatinine Clearance: 17.4 mL/min (A) (by C-G formula based on SCr of 3.27 mg/dL (H)). Liver Function Tests: Recent Labs  Lab 08/19/19 1605 08/20/19 0307  AST 137* 137*  ALT 65* 59*  ALKPHOS 51 53  BILITOT 0.9 0.9  PROT 7.7 7.4  ALBUMIN 3.0* 2.8*   No results for input(s): LIPASE, AMYLASE in the last 168 hours. No results for input(s): AMMONIA in the last 168 hours. Coagulation Profile: No results for input(s): INR, PROTIME in the last 168 hours. Cardiac Enzymes: No results for input(s): CKTOTAL, CKMB, CKMBINDEX, TROPONINI in the last 168 hours. BNP (last 3 results) No results for input(s): PROBNP in the last 8760 hours. HbA1C: No results for input(s): HGBA1C in the last 72 hours. CBG: No results for input(s): GLUCAP in the last 168 hours. Lipid Profile: No results for input(s): CHOL, HDL, LDLCALC, TRIG, CHOLHDL, LDLDIRECT in the last 72 hours. Thyroid Function Tests: No results for input(s): TSH, T4TOTAL, FREET4, T3FREE, THYROIDAB in the last 72 hours. Anemia Panel: No results for input(s): VITAMINB12, FOLATE, FERRITIN, TIBC, IRON, RETICCTPCT in the last 72 hours. Sepsis Labs: Recent Labs  Lab 08/19/19 1605 08/19/19 1816  LATICACIDVEN 3.1* 2.0*    No results found for this or any previous visit (from the past 240 hour(s)).       Radiology Studies: DG Chest Port 1 View  Result Date: 08/19/2019 CLINICAL DATA:  Pt has hx of dementia. Pt's wife stated that after d/c, pt has gotten weaker. Wife believes it is a medication reaction. Pt is taking decadron and z pack. Pt recently d/c behavioral medication during stay at [facility]. EXAM: PORTABLE CHEST 1 VIEW  COMPARISON:  08/09/2019 FINDINGS: Cardiac silhouette normal in size.  No mediastinal or hilar masses. Mild linear opacities are noted at the lung bases most likely atelectasis. These findings are similar to prior exam again accentuated by low lung volumes. Remainder of the lungs is clear. No pleural effusion or pneumothorax. Skeletal structures are grossly intact. IMPRESSION: No acute cardiopulmonary disease. Electronically Signed   By: Amie Portland M.D.   On: 08/19/2019 16:21        Scheduled Meds: . amLODipine  10 mg Oral Daily  . heparin  5,000 Units Subcutaneous Q8H   Continuous Infusions: . sodium chloride 125 mL/hr at 08/20/19 1345  . cefTRIAXone (ROCEPHIN)  IV Stopped (08/19/19 2259)     LOS: 1 day    Time spent: 53    Aquilla Hacker, MD [facility] Pager [phone]   If 7PM-7AM, please contact night-coverage [email or web address] Password [facility] 08/20/2019, 6:33 PM

## 2019-08-21 ENCOUNTER — Inpatient Hospital Stay (HOSPITAL_COMMUNITY): Payer: Medicare Other

## 2019-08-21 LAB — COMPREHENSIVE METABOLIC PANEL
ALT: 45 U/L — ABNORMAL HIGH (ref 0–44)
AST: 89 U/L — ABNORMAL HIGH (ref 15–41)
Albumin: 2.6 g/dL — ABNORMAL LOW (ref 3.5–5.0)
Alkaline Phosphatase: 57 U/L (ref 38–126)
Anion gap: 14 (ref 5–15)
BUN: 61 mg/dL — ABNORMAL HIGH (ref 8–23)
CO2: 17 mmol/L — ABNORMAL LOW (ref 22–32)
Calcium: 7.7 mg/dL — ABNORMAL LOW (ref 8.9–10.3)
Chloride: 118 mmol/L — ABNORMAL HIGH (ref 98–111)
Creatinine, Ser: 5.93 mg/dL — ABNORMAL HIGH (ref 0.61–1.24)
GFR calc Af Amer: 10 mL/min — ABNORMAL LOW (ref >60)
GFR calc non Af Amer: 8 mL/min — ABNORMAL LOW (ref >60)
Glucose, Bld: 106 mg/dL — ABNORMAL HIGH (ref 70–99)
Potassium: 4.4 mmol/L (ref 3.5–5.1)
Sodium: 149 mmol/L — ABNORMAL HIGH (ref 135–145)
Total Bilirubin: 0.8 mg/dL (ref 0.3–1.2)
Total Protein: 7.3 g/dL (ref 6.5–8.1)

## 2019-08-21 LAB — PHOSPHORUS: Phosphorus: 3.2 mg/dL (ref 2.5–4.6)

## 2019-08-21 LAB — CBC
HCT: 53.3 % — ABNORMAL HIGH (ref 39.0–52.0)
Hemoglobin: 16.2 g/dL (ref 13.0–17.0)
MCH: 25.8 pg — ABNORMAL LOW (ref 26.0–34.0)
MCHC: 30.4 g/dL (ref 30.0–36.0)
MCV: 84.9 fL (ref 80.0–100.0)
Platelets: 278 10*3/uL (ref 150–400)
RBC: 6.28 MIL/uL — ABNORMAL HIGH (ref 4.22–5.81)
RDW: 16 % — ABNORMAL HIGH (ref 11.5–15.5)
WBC: 20.4 10*3/uL — ABNORMAL HIGH (ref 4.0–10.5)
nRBC: 0 % (ref 0.0–0.2)

## 2019-08-21 LAB — MAGNESIUM: Magnesium: 2.1 mg/dL (ref 1.7–2.4)

## 2019-08-21 MED ORDER — IPRATROPIUM-ALBUTEROL 0.5-2.5 (3) MG/3ML IN SOLN: 3.0000 mL | RESPIRATORY_TRACT | Status: DC | PRN

## 2019-08-21 MED ORDER — LACTATED RINGERS IV SOLN: INTRAVENOUS | Status: DC

## 2019-08-21 MED ORDER — LACTATED RINGERS IV BOLUS
1000.0000 mL | Freq: Once | INTRAVENOUS | Status: AC
  Administered 2019-08-21: 09:00:00 1000 mL via INTRAVENOUS

## 2019-08-21 MED ORDER — HALOPERIDOL LACTATE 5 MG/ML IJ SOLN
2.5000 mg | Freq: Once | INTRAMUSCULAR | Status: AC
  Administered 2019-08-21: 2.5 mg via INTRAVENOUS
  Filled 2019-08-21: qty 1

## 2019-08-21 MED ORDER — ALBUTEROL SULFATE HFA 108 (90 BASE) MCG/ACT IN AERS
2.0000 | INHALATION_SPRAY | RESPIRATORY_TRACT | Status: DC | PRN
  Filled 2019-08-21: qty 6.7

## 2019-08-21 MED ORDER — SODIUM CHLORIDE 0.9 % IV BOLUS
500.0000 mL | Freq: Once | INTRAVENOUS | Status: AC
  Administered 2019-08-21: 500 mL via INTRAVENOUS

## 2019-08-21 MED ORDER — ACETAMINOPHEN 650 MG RE SUPP
325.0000 mg | Freq: Once | RECTAL | Status: AC
  Administered 2019-08-21: 325 mg via RECTAL
  Filled 2019-08-21: qty 1

## 2019-08-21 MED ORDER — SODIUM BICARBONATE 650 MG PO TABS
650.0000 mg | ORAL_TABLET | Freq: Three times a day (TID) | ORAL | Status: DC
  Administered 2019-08-21 – 2019-08-22 (×4): 650 mg via ORAL
  Filled 2019-08-21 (×4): qty 1

## 2019-08-21 MED ORDER — LACTATED RINGERS IV BOLUS: 500.0000 mL | Freq: Once | INTRAVENOUS | Status: DC

## 2019-08-21 NOTE — Progress Notes (Signed)
   08/20/19 1901  Vitals  ECG Heart Rate (!) 113  Cardiac Rhythm ST  ECG Intervals  PR interval 0.14  QRS interval 0.06  QT interval 0.3  QTc interval 0.41  MEWS Score  MEWS Temp 0  MEWS Systolic 0  MEWS Pulse 2  MEWS RR 0  MEWS LOC 0  MEWS Score 2  MEWS Score Color Yellow  MEWS Assessment  Is this an acute change? Yes  MEWS guidelines implemented *See Row Information* Yellow   Paged [facility] floor coverage (Dr. Rana Snare) about yellow MEWS score.  Response at 9:15 PM: New orders: IV bolus @ 500 ml/hr.  Yellow MEWS score guidelines implemented. Will continue to monitor patient.

## 2019-08-21 NOTE — Progress Notes (Signed)
Telemetry called me to inform about the patient being in ventricular trigiminy. Paged Dr. Stevie Kern, NP ([facility] Floor Coverage) at [phone] to make him aware. Made [facility] floor coverage aware of patient's drop in oxygen sats on 3-4 L and that Belma, Charge RN and I put patient on High Flow 02 on 8 liters.  New orders: Nebulizer order was put in, but per Belma, Charge, RN, aeorolizing treatments (nebulizers) cannot be done for Covid + patients. Nebulizer order not acknowledged.  Dr. Stevie Kern said it looks like patient's Covid is worsening. He said to see how the phosphorus and magnesium electrolyte lab draws turn out. Labs will be getting drawn soon.  Patient is alert. Will continue to monitor patient.

## 2019-08-21 NOTE — Progress Notes (Signed)
   08/20/19 2357  Vitals  Temp (!) 103 F (39.4 C)  BP (!) 137/91  MAP (mmHg) 106  BP Location Right Arm  BP Method Automatic  Patient Position (if appropriate) Lying  Pulse Rate (!) 108  Resp 20  Oxygen Therapy  SpO2 (!) 80 %  O2 Device Nasal Cannula  O2 Flow Rate (L/min) 1 L/min  MEWS Score  MEWS Temp 2  MEWS Systolic 0  MEWS Pulse 1  MEWS RR 0  MEWS LOC 0  MEWS Score 3  MEWS Score Color Yellow  MEWS Assessment  Is this an acute change? Yes  MEWS guidelines implemented *See Row Information* Yellow  Provider Notification  Provider Name/Title Dr. Rana Snare, [facility] Floor Coverage  Date Provider Notified 08/21/19  Time Provider Notified [phone]  Notification Type Page  Notification Reason Change in status  Response See new orders (325 mg Tylenol after 650 mg Tylenol was given by me)  Date of Provider Response 08/21/19  Time of Provider Response 0049   Paged Stevie Kern, NP ([facility] Floor Coverage) to inform that IV bolus is still running and 650 mg Tylenol suppository was recently given. Patient is alert. Asked for new orders. The following new orders at 0049:  Tylenol suppository 325 mg Second IV bolus @ 500 ml/hr Chest X-ray  Oxygen was increased to 3 L. Will continue to monitor patient.

## 2019-08-21 NOTE — Progress Notes (Signed)
PROGRESS NOTE    Donald [name]  FBP:[id number] DOB: 07/28/1941 DOA: 08/19/2019 PCP: Jani Gravel, MD    Brief Narrative: Donald [name] is a 79 y.o. male with medical history significant of dementia, hypertension, recent COVID-24 infection for which patient was admitted and discharged 08/09/2019 2 08/12/2019 at which point he had syncope acute kidney injury as well as shortness of breath.  Patient was discharged to follow-up with PCP.  He has apparently been weak and debilitated since discharge.  He was brought back today by wife due to worsening symptoms especially weakness.  Patient is unable to give adequate history.  He was found to be tachypneic but no evidence of pneumonia.  Patient noted to have acute kidney injury.  He has had decreased oral intake since discharge.  Appears to be dehydrated.  Patient is therefore being admitted with early sepsis especially with fever most likely post Covid syndrome with early pneumonia or UTI.Marland Kitchen Please see HPI for full details  Assessment & Plan:   Principal Problem:   ARF (acute renal failure) ([place]) Active Problems:   Dementia in Alzheimer's disease ([place])   Hypertension  Problems list 1.  Acute on chronic renal failure 2.  Pneumonia due to COVID-19 virus infection without respiratory symptoms 3.  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  Hypertension 5.  Diarrhea 6.  Leukocytosis  1.  Acute on chronic renal failure.  Baseline creatinine 6 months ago was 1.69.  Serum creatinine at 2.43 and trending up. Continue with aggressive IV fluid hydration.  Will give bolus IVF Avoid nephrotoxic medications Monitor renal function  2.  Pneumonia due to COVID-19 virus infection without respiratory symptoms.  Patient saturating above 90% on room air Continue bronchodilators Oxygen supplementation as needed Started on empiric antibiotics.  Will likely de-escalate  3.  Dementia, Alzheimer type without behavioral  disturbance Continue with conservative management  4.  Essential hypertension.  Currently normotensive We will continue with amlodipine  5.  Diarrhea.  Concern for possible C. difficile colitis Willl obtain C. difficile assay if stool is appropriate  6.  Leukocytosis.  Isolated, no fever, CRP down to 0.6 and no left shift on differential.  Likely steroid-induced versus occult C. difficile colitis Continue to monitor  DVT prophylaxis: Heparin subcutaneous  Code Status: DNR Family Communication: None Disposition Plan: Patient lives at home with wife and will likely be discharged back to facility/SNF. Barrier to discharge home includes recurrent readmission.   Consultants:   None  Procedures: None  Antimicrobials:  Ceftriaxone Azithromycin  Subjective: Patient was seen and examined at bedside.  Not in acute distress. Patient is asleep and not responding verbally but arousable.  Has significant cognitive impairment due to Alzheimer's dementia Not verbalizing  Objective: Vitals:   08/21/19 0256 08/21/19 0700 08/21/19 0708 08/21/19 1341  BP: (!) 142/89 (!) 149/84  (!) 154/84  Pulse: (!) 105 (!) 101  98  Resp: 20 20  19   Temp: 100.3 F (37.9 C) 100 F (37.8 C)  100 F (37.8 C)  TempSrc:    Axillary  SpO2: (!) 89%   95%  Weight:   75 kg   Height:        Intake/Output Summary (Last 24 hours) at 08/21/2019 1428 Last data filed at 08/21/2019 0600 Gross per 24 hour  Intake 3682.02 ml  Output --  Net 3682.02 ml   Filed Weights   08/19/19 2251 08/20/19 0500 08/21/19 0708  Weight: 69.5 kg 68.8 kg 75 kg    Examination:  General exam: Appears calm and comfortable and not in acute distress Respiratory system: Clear to auscultation. Respiratory effort normal. Cardiovascular system: S1 & S2 heard, RRR. No JVD, murmurs, rubs, gallops or clicks. No pedal edema. Gastrointestinal system: Abdomen is nondistended, soft and nontender. No organomegaly or masses felt. Normal  bowel sounds heard. Central nervous system: Alert but not oriented x3 Extremities: Bilateral lower extremity weakness  Skin: No rashes, lesions or ulcers Psychiatry: Nonverbal   Data Reviewed: I have personally reviewed following labs and imaging studies  CBC: Recent Labs  Lab 08/19/19 1605 08/20/19 0307 08/21/19 0311  WBC 12.2* 16.9* 20.4*  NEUTROABS 10.5*  --   --   HGB 15.9 16.1 16.2  HCT 51.8 54.1* 53.3*  MCV 85.2 86.7 84.9  PLT 307 292 278   Basic Metabolic Panel: Recent Labs  Lab 08/19/19 1605 08/20/19 0307 08/21/19 0311  NA 146* 147* 149*  K 3.8 4.0 4.4  CL 108 113* 118*  CO2 23 19* 17*  GLUCOSE 109* 124* 106*  BUN 43* 47* 61*  CREATININE 2.43* 3.27* 5.93*  CALCIUM 8.6* 8.3* 7.7*  MG  --   --  2.1  PHOS  --   --  3.2   GFR: Estimated Creatinine Clearance: 9.6 mL/min (A) (by C-G formula based on SCr of 5.93 mg/dL (H)). Liver Function Tests: Recent Labs  Lab 08/19/19 1605 08/20/19 0307 08/21/19 0311  AST 137* 137* 89*  ALT 65* 59* 45*  ALKPHOS 51 53 57  BILITOT 0.9 0.9 0.8  PROT 7.7 7.4 7.3  ALBUMIN 3.0* 2.8* 2.6*   No results for input(s): LIPASE, AMYLASE in the last 168 hours. No results for input(s): AMMONIA in the last 168 hours. Coagulation Profile: No results for input(s): INR, PROTIME in the last 168 hours. Cardiac Enzymes: No results for input(s): CKTOTAL, CKMB, CKMBINDEX, TROPONINI in the last 168 hours. BNP (last 3 results) No results for input(s): PROBNP in the last 8760 hours. HbA1C: No results for input(s): HGBA1C in the last 72 hours. CBG: No results for input(s): GLUCAP in the last 168 hours. Lipid Profile: No results for input(s): CHOL, HDL, LDLCALC, TRIG, CHOLHDL, LDLDIRECT in the last 72 hours. Thyroid Function Tests: No results for input(s): TSH, T4TOTAL, FREET4, T3FREE, THYROIDAB in the last 72 hours. Anemia Panel: No results for input(s): VITAMINB12, FOLATE, FERRITIN, TIBC, IRON, RETICCTPCT in the last 72 hours. Sepsis  Labs: Recent Labs  Lab 08/19/19 1605 08/19/19 1816  LATICACIDVEN 3.1* 2.0*    No results found for this or any previous visit (from the past 240 hour(s)).       Radiology Studies: DG CHEST PORT 1 VIEW  Result Date: 08/21/2019 CLINICAL DATA:  Shortness of breath and COVID-19 positivity EXAM: PORTABLE CHEST 1 VIEW COMPARISON:  08/19/2019 FINDINGS: Cardiac shadow is stable. Lungs are well aerated bilaterally. Slight increase in patchy opacities are noted bilaterally consistent with the given clinical history. No sizable effusion is noted. No bony abnormality is seen. IMPRESSION: Increased parenchymal opacities consistent with the given clinical history. Electronically Signed   By: Alcide Clever M.D.   On: 08/21/2019 01:38   DG Chest Port 1 View  Result Date: 08/19/2019 CLINICAL DATA:  Pt has hx of dementia. Pt's wife stated that after d/c, pt has gotten weaker. Wife believes it is a medication reaction. Pt is taking decadron and z pack. Pt recently d/c behavioral medication during stay at [facility]. EXAM: PORTABLE CHEST 1 VIEW COMPARISON:  08/09/2019 FINDINGS: Cardiac silhouette normal in size.  No  mediastinal or hilar masses. Mild linear opacities are noted at the lung bases most likely atelectasis. These findings are similar to prior exam again accentuated by low lung volumes. Remainder of the lungs is clear. No pleural effusion or pneumothorax. Skeletal structures are grossly intact. IMPRESSION: No acute cardiopulmonary disease. Electronically Signed   By: Amie Portland M.D.   On: 08/19/2019 16:21        Scheduled Meds: . amLODipine  10 mg Oral Daily  . heparin  5,000 Units Subcutaneous Q8H  . sodium bicarbonate  650 mg Oral TID   Continuous Infusions: . cefTRIAXone (ROCEPHIN)  IV 1 g (08/20/19 2222)  . lactated ringers 125 mL/hr at 08/21/19 1036     LOS: 2 days    Time spent: 70    Aquilla Hacker, MD [facility] Pager [phone]   If 7PM-7AM, please contact  night-coverage [email or web address] Password [facility] 08/21/2019, 2:28 PM

## 2019-08-21 NOTE — Progress Notes (Signed)
Pt alert, very confused, non verbal. Pt is very tense, agitated , fighting the staff. During first round Pt's v/s were R-24, Pulse 102 BP-152/98, T-100.5 , pulse oX onO2 94% on HFNC 4 l. [place] Yellow. Yellow MEWS guidelines were implemented.Pain assessment was done  PAINAD-6. Tylenol was given. Some effect was noted. After rechecking Pt's v/s noted that Respiration -28. Pt still remains agitated, very tense, fighting, pulling staff. MD floor coverage was made aware.Md ordered Haldol 2.5 mg once.

## 2019-08-21 NOTE — Evaluation (Signed)
Clinical/Bedside Swallow Evaluation Patient Details  Name: Donald [name] MRN: [id number] Date of Birth: September 14, 1940  Today's Date: 08/21/2019 Time: SLP Start Time (ACUTE ONLY): 47 SLP Stop Time (ACUTE ONLY): 1622 SLP Time Calculation (min) (ACUTE ONLY): 12 min  Past Medical History:  Past Medical History:  Diagnosis Date  . Dementia ([place])   . Hypertension    Past Surgical History:  Past Surgical History:  Procedure Laterality Date  . NO PAST SURGERIES     HPI:  Donald [name] is a 79 y.o. male with medical history significant of dementia, hypertension, recent COVID-19 infection for which patient was admitted and discharged 08/09/2019 2 08/12/2019 at which point he had syncope acute kidney injury as well as shortness of breath.  Patient was discharged to follow-up with PCP.  He has apparently been weak and debilitated with decreased PO intake since discharge.  He was brought back by wife due to worsening symptoms especially weakness.   He was found to be tachypneic but no evidence of pneumonia.  Patient noted to have acute kidney injury.  He has had decreased oral intake since discharge.  Appears to be dehydrated.  Pt seen by ST for a BSE on 08/11/19 with recommendations for regular solids and thin liquids.   Assessment / Plan / Recommendation Clinical Impression  Pt presents with oral dysphagia likely secondary to edentulism (dentures not placed secondary to poor fit) and cognitive deficits.  Pt was encountered asleep in bed and he roused to moderate verbal stimulation; however, he remained lethargic throughout this evaluation.  He consumed minimal trials of thin liquid and puree.  Pt with decreased labial seal around the spoon and straw.  He was unable to draw liquid from the straw independently despite effort, therefore liquid was provided via tsp and siphoned straw sip.  AP transport was prolonged and suspect delayed swallow initiation; however, no clinical s/sx of  aspiration were observed with thin liquid or pureed solids.  Trace R anterior labial spillage was observed during thin liquid trials.  Trace oral residue was observed following puree trials which the pt was able to clear given a liquid wash.  Pt refused additional trials via closing his lips tightly and turning away from SLP.  Recommend continuation of Dysphagia 1 (puree) solids and thin liquids with the following precautions: 1) Small bites/sips 2) Slow rate of intake 3) Limit distractions 4) Sit upright 5) Awake/alert for PO intake.  SLP will f/u for dysphagia treatment per POC.   SLP Visit Diagnosis: Dysphagia, oral phase (R13.11)    Aspiration Risk  Mild aspiration risk    Diet Recommendation Dysphagia 1 (Puree);Thin liquid   Liquid Administration via: Straw;Cup;Spoon Medication Administration: Crushed with puree Supervision: Full supervision/cueing for compensatory strategies;Staff to assist with self feeding Compensations: Minimize environmental distractions;Slow rate;Small sips/bites Postural Changes: Remain upright for at least 30 minutes after po intake    Other  Recommendations Oral Care Recommendations: Oral care BID   Follow up Recommendations Skilled Nursing facility;24 hour supervision/assistance      Frequency and Duration min 2x/week  2 weeks       Prognosis Prognosis for Safe Diet Advancement: Fair Barriers to Reach Goals: Cognitive deficits      Swallow Study   General Date of Onset: 08/19/19 HPI: Donald [name] is a 79 y.o. male with medical history significant of dementia, hypertension, recent COVID-19 infection for which patient was admitted and discharged 08/09/2019 2 08/12/2019 at which point he had syncope acute kidney injury as well  as shortness of breath.  Patient was discharged to follow-up with PCP.  He has apparently been weak and debilitated with decreased PO intake since discharge.  He was brought back by wife due to worsening symptoms especially  weakness.   He was found to be tachypneic but no evidence of pneumonia.  Patient noted to have acute kidney injury.  He has had decreased oral intake since discharge.  Appears to be dehydrated.  Pt seen by ST for a BSE on 08/11/19 with recommendations for regular solids and thin liquids. Type of Study: Bedside Swallow Evaluation Previous Swallow Assessment: HPI  Diet Prior to this Study: Dysphagia 1 (puree);Thin liquids Temperature Spikes Noted: Yes Respiratory Status: Nasal cannula History of Recent Intubation: No Behavior/Cognition: Alert;Doesn't follow directions Oral Cavity Assessment: (Difficult to assess ) Oral Care Completed by SLP: No Oral Cavity - Dentition: Edentulous(Dentures at hospital but poor fitting ) Self-Feeding Abilities: Needs assist Patient Positioning: Upright in bed Baseline Vocal Quality: Not observed Volitional Cough: Cognitively unable to elicit Volitional Swallow: Unable to elicit    Oral/Motor/Sensory Function Overall Oral Motor/Sensory Function: (Unable to evaluate secondary to dementia )   Ice Chips Ice chips: Not tested   Thin Liquid Thin Liquid: Impaired Presentation: Spoon;Straw Oral Phase Impairments: Reduced labial seal Oral Phase Functional Implications: Right anterior spillage;Prolonged oral transit Pharyngeal  Phase Impairments: Suspected delayed Swallow    Nectar Thick Nectar Thick Liquid: Not tested   Honey Thick Honey Thick Liquid: Not tested   Puree Puree: Impaired Presentation: Spoon Oral Phase Impairments: Reduced lingual movement/coordination Oral Phase Functional Implications: Oral residue;Prolonged oral transit Pharyngeal Phase Impairments: Suspected delayed Swallow   Solid     Solid: Not tested     [name] M.S., CCC-SLP Acute Rehabilitation Services Office: [phone]  Shanon Rosser Chondra Boyde 08/21/2019,5:51 PM

## 2019-08-21 NOTE — Progress Notes (Signed)
MEWS score now green.  

## 2019-08-22 ENCOUNTER — Inpatient Hospital Stay (HOSPITAL_COMMUNITY): Payer: Medicare Other

## 2019-08-22 DIAGNOSIS — A419 Sepsis, unspecified organism: Secondary | ICD-10-CM

## 2019-08-22 LAB — BLOOD GAS, ARTERIAL
Acid-base deficit: 8.4 mmol/L — ABNORMAL HIGH (ref 0.0–2.0)
Bicarbonate: 13.7 mmol/L — ABNORMAL LOW (ref 20.0–28.0)
Drawn by: 331471
FIO2: 36
O2 Saturation: 91.3 %
Patient temperature: 98.6
pCO2 arterial: 22.3 mmHg — ABNORMAL LOW (ref 32.0–48.0)
pH, Arterial: 7.403 (ref 7.350–7.450)
pO2, Arterial: 61.4 mmHg — ABNORMAL LOW (ref 83.0–108.0)

## 2019-08-22 LAB — SODIUM, URINE, RANDOM: Sodium, Ur: 51 mmol/L

## 2019-08-22 LAB — CREATININE, URINE, RANDOM: Creatinine, Urine: 132.84 mg/dL

## 2019-08-22 MED ORDER — LACTATED RINGERS IV BOLUS
1000.0000 mL | Freq: Once | INTRAVENOUS | Status: AC
  Administered 2019-08-22: 08:00:00 1000 mL via INTRAVENOUS

## 2019-08-22 MED ORDER — HYDRALAZINE HCL 20 MG/ML IJ SOLN
10.0000 mg | Freq: Once | INTRAMUSCULAR | Status: AC
  Administered 2019-08-22: 02:00:00 10 mg via INTRAVENOUS
  Filled 2019-08-22: qty 1

## 2019-08-22 MED ORDER — VANCOMYCIN HCL 1500 MG/300ML IV SOLN
1500.0000 mg | Freq: Once | INTRAVENOUS | Status: AC
  Administered 2019-08-22: 09:00:00 1500 mg via INTRAVENOUS
  Filled 2019-08-22: qty 300

## 2019-08-22 MED ORDER — SODIUM CHLORIDE 0.9 % IV SOLN
2.0000 g | INTRAVENOUS | Status: DC
  Administered 2019-08-22 – 2019-08-23 (×2): 2 g via INTRAVENOUS
  Filled 2019-08-22: qty 20
  Filled 2019-08-22: qty 2

## 2019-08-22 MED ORDER — AMLODIPINE BESYLATE 5 MG PO TABS
5.0000 mg | ORAL_TABLET | Freq: Two times a day (BID) | ORAL | Status: DC
  Administered 2019-08-22 – 2019-08-31 (×18): 5 mg via ORAL
  Filled 2019-08-22 (×18): qty 1

## 2019-08-22 MED ORDER — SODIUM BICARBONATE 8.4 % IV SOLN
INTRAVENOUS | Status: DC
  Filled 2019-08-22 (×5): qty 1000

## 2019-08-22 MED ORDER — CHLORHEXIDINE GLUCONATE CLOTH 2 % EX PADS
6.0000 | MEDICATED_PAD | Freq: Every day | CUTANEOUS | Status: DC
  Administered 2019-08-22 – 2019-08-31 (×10): 6 via TOPICAL

## 2019-08-22 MED ORDER — LORAZEPAM 2 MG/ML IJ SOLN
0.5000 mg | Freq: Once | INTRAMUSCULAR | Status: AC
  Administered 2019-08-22: 0.5 mg via INTRAVENOUS
  Filled 2019-08-22: qty 1

## 2019-08-22 MED ORDER — MORPHINE SULFATE (PF) 2 MG/ML IV SOLN
1.0000 mg | INTRAVENOUS | Status: DC | PRN
  Administered 2019-08-22 – 2019-08-28 (×5): 1 mg via INTRAVENOUS
  Filled 2019-08-22 (×6): qty 1

## 2019-08-22 MED ORDER — SODIUM CHLORIDE 0.9 % IV SOLN: INTRAVENOUS | Status: DC

## 2019-08-22 MED ORDER — VANCOMYCIN VARIABLE DOSE PER UNSTABLE RENAL FUNCTION (PHARMACIST DOSING): Status: DC

## 2019-08-22 NOTE — Progress Notes (Signed)
Charge nurse Langley Adie and Gerrianne Scale was made aware . Pt was assessed by Ene. Continue to monitor and follow MEWS guidelines.

## 2019-08-22 NOTE — Progress Notes (Signed)
MD Bodenheimer was made aware re: MEWS is 4 red. Pt is very anxious. Pulse increased from 102 to 116, respirations 28, BP-152/96. Pulse ox 99 on O2 via HFNC @ 4 liter.  Md get back and ordered Ativan.

## 2019-08-22 NOTE — Progress Notes (Signed)
MEWS red guidelines in progress. Pt still MEWS 4 red. Ativan was given as orders, pt less anxious, but still very tense.

## 2019-08-22 NOTE — Consult Note (Signed)
Renal Service Consult Note [facility]  Donald [name] 08/22/2019 [name] Requesting Physician:  Dr Donald [name], Donald [name].   Reason for Consult:  Renal failure HPI: The patient is a 79 y.o. year-old with hx of HTN and dementia and recent COVID-19 infection admitted Jan 4th - 7th, 2021.  Was dc'd to f/u w/ PCP but got weaker and more debilitated after dc.  Brought to ED on 1/14. Appeared dehydrated and B/Cr were up at 43/ 2.43, +fevers.  Pt was admitted and rec'd IVF's 2.5L, 3.7L on 1/14 and 1/15.  Creat however worsened up to 3.2 on 1/15 and 5.93 on 1/16.  Today a foley cath was placed around 10am w/ 2000 cc UOP initially. He went for renal US just after foley placed which showed mild bilat hydro and decompressed bladder w/ foley in place.  No labs done today. Asked to see for renal failure.    Pt is nonverbal, no hx obtained.      Date   Creat  eGFR   2014- 2018  1.4- 1.12 Sep 2018  1.69  44   Jan 4- 7, 2021 1.95 >> 1.48 37- 51   Jan 14  2.43  28    Jan 15  3.27   Jan 16  5.93  10    Old UA's 2014, 2015, 2017 and feb 2020 > neg protein and no sig rbc/ wbc   ROS - n/a   Past Medical History  Past Medical History:  Diagnosis Date  . Dementia ([place])   . Hypertension    Past Surgical History  Past Surgical History:  Procedure Laterality Date  . NO PAST SURGERIES     Family History  Family History  Problem Relation Age of Onset  . Hypertension Mother   . Dementia Sister    Social History  reports that he has never smoked. He has never used smokeless tobacco. He reports that he does not drink alcohol or use drugs. Allergies No Known Allergies Home medications Prior to Admission medications   Medication Sig Start Date End Date Taking? Authorizing Provider  Amino Acids-Protein Hydrolys (FEEDING SUPPLEMENT, PRO-STAT SUGAR FREE 64,) LIQD Take 30 mLs by mouth 2 (two) times daily. 08/12/19  Yes Dahal, Marlowe Aschoff, MD  amLODipine (NORVASC) 10 MG tablet Take  10 mg by mouth daily.    Yes [provider]  azithromycin (ZITHROMAX) 250 MG tablet Take 1-2 tablets by mouth as directed. Started 1.12.2021 took 58m now 2532mwill end 1.16.2021 08/17/19  Yes [provider]  dexamethasone (DECADRON) 2 MG tablet Take 6 mg by mouth daily. 08/12/19  Yes [provider]  feeding supplement, ENSURE ENLIVE, (ENSURE ENLIVE) LIQD Take 237 mLs by mouth 2 (two) times daily between meals. 08/12/19  Yes Dahal, Donald AschoffMD  lisinopril (ZESTRIL) 40 MG tablet Take 40 mg by mouth daily. 01/05/19  Yes [provider]  pantoprazole (PROTONIX) 40 MG tablet Take 40 mg by mouth daily. 06/02/19  Yes [provider]   Liver Function Tests Recent Labs  Lab 08/19/19 1605 08/20/19 0307 08/21/19 0311  AST 137* 137* 89*  ALT 65* 59* 45*  ALKPHOS 51 53 57  BILITOT 0.9 0.9 0.8  PROT 7.7 7.4 7.3  ALBUMIN 3.0* 2.8* 2.6*   No results for input(s): LIPASE, AMYLASE in the last 168 hours. CBC Recent Labs  Lab 08/19/19 1605 08/20/19 0307 08/21/19 0311  WBC 12.2* 16.9* 20.4*  NEUTROABS 10.5*  --   --   HGB  15.9 16.1 16.2  HCT 51.8 54.1* 53.3*  MCV 85.2 86.7 84.9  PLT 307 292 939   Basic Metabolic Panel Recent Labs  Lab 08/19/19 1605 08/20/19 0307 08/21/19 0311  NA 146* 147* 149*  K 3.8 4.0 4.4  CL 108 113* 118*  CO2 23 19* 17*  GLUCOSE 109* 124* 106*  BUN 43* 47* 61*  CREATININE 2.43* 3.27* 5.93*  CALCIUM 8.6* 8.3* 7.7*  PHOS  --   --  3.2   Iron/TIBC/Ferritin/ %Sat    Component Value Date/Time   FERRITIN 206 08/12/2019 0410    Vitals:   08/22/19 1020 08/22/19 1122 08/22/19 1320 08/22/19 1620  BP: (!) 158/82  135/76 (!) 144/74  Pulse: (!) 115  98 96  Resp: (!) 30  (!) 22 (!) 22  Temp: 99.3 F (37.4 C)  99.6 F (37.6 C) 99 F (37.2 C)  TempSrc:      SpO2: 98%  98% 96%  Weight:  75.2 kg    Height:        Exam Gen eyes closed, nonverbal, won't respond to commands, resists exam if I try to move his arms and  becomes agitated and combative, o/w no distress, coughing some, nasal O2 No rash, cyanosis or gangrene Sclera anicteric, throat clear  No jvd or bruits Chest bilat coarse basilar rales and rhonchi RRR no MRG Abd soft ntnd no mass or ascites +bs GU normal male w foley draining clear yellow urine MS no joint effusions or deformity Ext no sigLE or UE  edema Neuro as above    Home meds:  - amlodipine 10/ lisinopril 40  - pantoprazole 40  - decadron 6 mg daily   UNa 51, Ucreat 133  UA pend   Renal US - at 10:38 am today > 11-12 cm kidneys, mild bilat hydro, cysts   10f foley placed at 10:06 am this morning, 800 cc out directly than another 1200cc , total 2000 after placement  Assessment/ Plan: 1. AoCKD 3 - baseline creat 1.4- 1.8.  Most likely this is obstructive neph from urinary retention.  Labs not done today have ordered for now.  > 2000 cc out w/ foley placement this am.  Pt is not a dialysis candidate given comorbidities. DNR.  Supportive care. No vol^ on exam. Will follow.  2. Hypernatremia/ met acidosis - will change IVF to hypotonic bicarb gtt at 150/hr 3. Urinary retention - keep foley in for now, o/w per primary  4. Dementia, marked 5. COVID + PNA 6. DNR   Donald Splinter MD 08/22/2019, 6:11 PM

## 2019-08-22 NOTE — Progress Notes (Signed)
Report given to RN at [facility]. Pt going to room 114. Waiting for transportation. VSS. Resting in bed with call light within reach. MEWS now green. Requiring 3L O2 at this time. Will cont to mx.

## 2019-08-22 NOTE — Progress Notes (Signed)
PROGRESS NOTE    Donald [name]  GNF:[id number] DOB: 01-09-1941 DOA: 08/19/2019 PCP: Jani Gravel, MD    Brief Narrative: Donald [name] is a 79 y.o. male with medical history significant of dementia, hypertension, recent COVID-61 infection for which patient was admitted and discharged 08/09/2019 2 08/12/2019 at which point he had syncope acute kidney injury as well as shortness of breath.  Patient was discharged to follow-up with PCP.  He has apparently been weak and debilitated since discharge.  He was brought back today by wife due to worsening symptoms especially weakness.  Patient is unable to give adequate history.  He was found to be tachypneic but no evidence of pneumonia.  Patient noted to have acute kidney injury.  He has had decreased oral intake since discharge.  Appears to be dehydrated.  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  Acute on chronic renal failure 2.  Sepsis secondary to pneumonia due to COVID-19 virus infection  3.  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  Hypertension 5.  Diarrhea 6.  Leukocytosis  7.  Metabolic acidosis  1.  Acute on chronic renal failure.  Baseline creatinine 6 months ago was 1.69.  Serum creatinine at 2.43 and trending up. Serum creatinine appears to be worsening and currently at 5.93 today. Nephrology was consulted and recommended obtaining bilateral renal ultrasound. A Foley catheter was inserted and more than 2 L was drained. Concerns for possible obstructive uropathy likely prostate enlargement. Continue with aggressive IV fluid hydration.   Avoid nephrotoxic medications Monitor renal function  2.   Sepsis secondary to pneumonia due to COVID-19 virus infection   Patient noted with fever, tachycardia, tachypnea and leukocytosis.  Patient now requiring increasing oxygen up to 5 L O2 to maintain saturation above 90%. Continue bronchodilators Continue with oxygen and wean off as tolerated Continue with empiric Vanco and ceftriaxone.     3.  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  Essential hypertension.  Currently normotensive Will continue with amlodipine  5.  Diarrhea.  Concern for possible C. difficile colitis Willl obtain C. difficile assay if stool is appropriate  6.  Leukocytosis.  Patient now has a fever, tachycardia and tachypnea Concern for possible sepsis  7.  Metabolic acidosis Continue with sodium bicarb tabs  DVT prophylaxis: Heparin subcutaneous  Code Status: DNR Family Communication: None Disposition Plan: Patient lives at home with wife and will likely be discharged back to SNF. Barrier to discharge home includes care provider/recurrent readmission.   Consultants:   None  Procedures: None  Antimicrobials:  Ceftriaxone Azithromycin  Subjective: Patient was seen and examined at bedside.  Not in acute distress. Patient is asleep and not responding verbally but arousable.  Has significant cognitive impairment due to Alzheimer's dementia Not verbalizing  Objective: Vitals:   08/22/19 0617 08/22/19 0629 08/22/19 0655 08/22/19 1020  BP: 117/81 (!) 129/105 (!) 159/80 (!) 158/82  Pulse: (!) 122 (!) 117 (!) 116 (!) 115  Resp: (!) 28 (!) 28 (!) 28 (!) 30  Temp:    99.3 F (37.4 C)  TempSrc:      SpO2: 98% 99% 99% 98%  Weight:  Height:        Intake/Output Summary (Last 24 hours) at 08/22/2019 1209 Last data filed at 08/22/2019 1200 Gross per 24 hour  Intake 1959.59 ml  Output 2030 ml  Net -70.41 ml   Filed Weights   08/19/19 2251 08/20/19 0500 08/21/19 0708  Weight: 69.5 kg 68.8 kg 75  kg    Examination:  General exam: Patient is agitated and restless, requiring bilateral upper extremity mittens.  Requiring 5 L O2 to maintain saturation above 90% Respiratory system: Clear to auscultation. Respiratory effort normal. Cardiovascular system: S1 & S2 heard, RRR. No JVD, murmurs, rubs, gallops or clicks. No pedal edema. Gastrointestinal system: Abdomen is nondistended, soft and nontender. No organomegaly or masses felt. Normal bowel sounds heard. Central nervous system: Alert but not oriented x3 Extremities: Bilateral lower extremity weakness  Skin: No rashes, lesions or ulcers Psychiatry: Nonverbal   Data Reviewed: I have personally reviewed following labs and imaging studies  CBC: Recent Labs  Lab 08/19/19 1605 08/20/19 0307 08/21/19 0311  WBC 12.2* 16.9* 20.4*  NEUTROABS 10.5*  --   --   HGB 15.9 16.1 16.2  HCT 51.8 54.1* 53.3*  MCV 85.2 86.7 84.9  PLT 307 292 278   Basic Metabolic Panel: Recent Labs  Lab 08/19/19 1605 08/20/19 0307 08/21/19 0311  NA 146* 147* 149*  K 3.8 4.0 4.4  CL 108 113* 118*  CO2 23 19* 17*  GLUCOSE 109* 124* 106*  BUN 43* 47* 61*  CREATININE 2.43* 3.27* 5.93*  CALCIUM 8.6* 8.3* 7.7*  MG  --   --  2.1  PHOS  --   --  3.2   GFR: Estimated Creatinine Clearance: 9.6 mL/min (A) (by C-G formula based on SCr of 5.93 mg/dL (H)). Liver Function Tests: Recent Labs  Lab 08/19/19 1605 08/20/19 0307 08/21/19 0311  AST 137* 137* 89*  ALT 65* 59* 45*  ALKPHOS 51 53 57  BILITOT 0.9 0.9 0.8  PROT 7.7 7.4 7.3  ALBUMIN 3.0* 2.8* 2.6*   No results for input(s): LIPASE, AMYLASE in the last 168 hours. No results for input(s): AMMONIA in the last 168 hours. Coagulation Profile: No results for input(s): INR, PROTIME in the last 168 hours. Cardiac Enzymes: No results for input(s): CKTOTAL, CKMB, CKMBINDEX, TROPONINI in the last 168 hours. BNP (last 3 results) No results for input(s): PROBNP in the last 8760 hours. HbA1C: No results  for input(s): HGBA1C in the last 72 hours. CBG: No results for input(s): GLUCAP in the last 168 hours. Lipid Profile: No results for input(s): CHOL, HDL, LDLCALC, TRIG, CHOLHDL, LDLDIRECT in the last 72 hours. Thyroid Function Tests: No results for input(s): TSH, T4TOTAL, FREET4, T3FREE, THYROIDAB in the last 72 hours. Anemia Panel: No results for input(s): VITAMINB12, FOLATE, FERRITIN, TIBC, IRON, RETICCTPCT in the last 72 hours. Sepsis Labs: Recent Labs  Lab 08/19/19 1605 08/19/19 1816  LATICACIDVEN 3.1* 2.0*    No results found for this or any previous visit (from the past 240 hour(s)).       Radiology Studies: US RENAL  Result Date: 08/22/2019 CLINICAL DATA:  Hypertension, dementia, COVID positive EXAM: RENAL / URINARY TRACT ULTRASOUND COMPLETE COMPARISON:  CT abdomen 10/02/2018 FINDINGS: Right Kidney: Renal measurements: 11.2 x 5.9 x 6.0 cm = volume: 206 mL. Mild hydronephrosis. Two prominent anechoic cysts measuring 4 cm and 2.5 cm. No complexity Left Kidney: Renal measurements: 11.6 x 4.7 x 5.7 cm = volume: 163 mL. Mild hydronephrosis. Large anechoic cyst measuring 4 cm.  No  complexity. Bladder: Bladder collapsed around Foley catheter. Other: None. IMPRESSION: 1. Mild bilateral hydronephrosis. 2. Bilateral benign appearing renal cysts. 3. Foley catheter within bladder. Electronically Signed   By: Genevive Bi M.D.   On: 08/22/2019 10:52   DG Chest Port 1 View  Addendum Date: 08/22/2019   ADDENDUM REPORT: 08/22/2019 10:49 ADDENDUM: Correction: Impression 2. Subtle bilateral airspace disease would be consistent with viral pneumonia Electronically Signed   By: Genevive Bi M.D.   On: 08/22/2019 10:49   Result Date: 08/22/2019 CLINICAL DATA:  Difficulty breathing COVID positive EXAM: PORTABLE CHEST 1 VIEW COMPARISON:  08/21/2019 positive COVID FINDINGS: A stable mediastinum and cardiac silhouette. There is subtle bilateral airspace densities. Low lung volumes. No  pneumothorax IMPRESSION: 1. No interval change. 2. Subtle bilateral airspace densities could would be consistent with viral pneumonia. Electronically Signed: By: Genevive Bi M.D. On: 08/22/2019 08:39   DG CHEST PORT 1 VIEW  Result Date: 08/21/2019 CLINICAL DATA:  Shortness of breath and COVID-19 positivity EXAM: PORTABLE CHEST 1 VIEW COMPARISON:  08/19/2019 FINDINGS: Cardiac shadow is stable. Lungs are well aerated bilaterally. Slight increase in patchy opacities are noted bilaterally consistent with the given clinical history. No sizable effusion is noted. No bony abnormality is seen. IMPRESSION: Increased parenchymal opacities consistent with the given clinical history. Electronically Signed   By: Alcide Clever M.D.   On: 08/21/2019 01:38        Scheduled Meds: . amLODipine  10 mg Oral Daily  . Chlorhexidine Gluconate Cloth  6 each Topical Daily  . heparin  5,000 Units Subcutaneous Q8H  . sodium bicarbonate  650 mg Oral TID  . vancomycin variable dose per unstable renal function (pharmacist dosing)   Does not apply See admin instructions   Continuous Infusions: . sodium chloride    . cefTRIAXone (ROCEPHIN)  IV       LOS: 3 days    Time spent: 63    Aquilla Hacker, MD [facility] Pager [phone]   If 7PM-7AM, please contact night-coverage [email or web address] Password [facility] 08/22/2019, 12:09 PM

## 2019-08-22 NOTE — Progress Notes (Signed)
MD Bodenheimer  was updated on Pt's status. MEWS is green at this time. No new orders. Continue to monitor.

## 2019-08-22 NOTE — Progress Notes (Signed)
Received report from night RN. Pt in bed with call light within reach. Appears restless. Discussed with MD plan for today. Pt currently red MEWS. Consulted Palliative care as ordered. Pt currently requiring 4L O2 stating 96% with resp at 30 bpm. Pt remains hypertensive and tachycardic on the monitor, last HR was 116 bpm. Currently afebrile and voiding small amounts of incontinent amber urine. MD placing new orders at this time. Will cont to follow MEWS protocol and monitor closely.

## 2019-08-22 NOTE — Progress Notes (Signed)
Sent UA down to lab. output since foley insertion. Amber, clear urine. Pt MEWS now yellow. Still monitoring VS. Pt stable. New order to transfer to [facility]

## 2019-08-22 NOTE — Progress Notes (Signed)
16 fr foley catheter placed as ordered. 800 mL amber urine out on placement. Securement device applied. Nephrology consulted. Will cont to mx output.

## 2019-08-22 NOTE — Progress Notes (Signed)
Pharmacy Antibiotic Note  Donald [name] is a 79 y.o. male with hx dementia and recently hospitalized for COVID (1/4 to 1/7), presented to the ED on 08/19/2019 with generalized weakness. He was started on ceftriaxone on admission for PNA. Pharmacy was consulted on 1/17 to start vancomycin for suspected sepsis.  Today, 08/22/2019: - Tmax 100.5, wbc up 20.4 -  scr up 5.93 (crcl<10)  Plan: - vancomycin 1500 mg IV x1 now - with renal insuff., will check random level in 1-2 days and re-dose if < 20 - will increase ceftriaxone to 2 gm IV q24h for sepsis indication - f/u renal function ___________________________________  Height: 5\' 8"  (172.7 cm) Weight: 165 lb 5.5 oz (75 kg) IBW/kg (Calculated) : 68.4  Temp (24hrs), Avg:99.4 F (37.4 C), Min:98.6 F (37 C), Max:100.5 F (38.1 C)  Recent Labs  Lab 08/19/19 1605 08/19/19 1816 08/20/19 0307 08/21/19 0311  WBC 12.2*  --  16.9* 20.4*  CREATININE 2.43*  --  3.27* 5.93*  LATICACIDVEN 3.1* 2.0*  --   --     Estimated Creatinine Clearance: 9.6 mL/min (A) (by C-G formula based on SCr of 5.93 mg/dL (H)).    No Known Allergies    Thank you for allowing pharmacy to be a part of this patient's care.  08/23/19 08/22/2019 7:33 AM

## 2019-08-23 LAB — NA AND K (SODIUM & POTASSIUM), RAND UR
Potassium Urine: 34 mmol/L
Sodium, Ur: 65 mmol/L

## 2019-08-23 LAB — COMPREHENSIVE METABOLIC PANEL
ALT: 40 U/L (ref 0–44)
AST: 76 U/L — ABNORMAL HIGH (ref 15–41)
Albumin: 2.3 g/dL — ABNORMAL LOW (ref 3.5–5.0)
Alkaline Phosphatase: 58 U/L (ref 38–126)
Anion gap: 10 (ref 5–15)
BUN: 32 mg/dL — ABNORMAL HIGH (ref 8–23)
CO2: 25 mmol/L (ref 22–32)
Calcium: 8.2 mg/dL — ABNORMAL LOW (ref 8.9–10.3)
Chloride: 116 mmol/L — ABNORMAL HIGH (ref 98–111)
Creatinine, Ser: 2.46 mg/dL — ABNORMAL HIGH (ref 0.61–1.24)
GFR calc Af Amer: 28 mL/min — ABNORMAL LOW (ref >60)
GFR calc non Af Amer: 24 mL/min — ABNORMAL LOW (ref >60)
Glucose, Bld: 114 mg/dL — ABNORMAL HIGH (ref 70–99)
Potassium: 3.6 mmol/L (ref 3.5–5.1)
Sodium: 151 mmol/L — ABNORMAL HIGH (ref 135–145)
Total Bilirubin: 0.5 mg/dL (ref 0.3–1.2)
Total Protein: 6.8 g/dL (ref 6.5–8.1)

## 2019-08-23 LAB — CBC
HCT: 46.5 % (ref 39.0–52.0)
Hemoglobin: 14.7 g/dL (ref 13.0–17.0)
MCH: 25.7 pg — ABNORMAL LOW (ref 26.0–34.0)
MCHC: 31.6 g/dL (ref 30.0–36.0)
MCV: 81.4 fL (ref 80.0–100.0)
Platelets: 281 10*3/uL (ref 150–400)
RBC: 5.71 MIL/uL (ref 4.22–5.81)
RDW: 15.7 % — ABNORMAL HIGH (ref 11.5–15.5)
WBC: 11.2 10*3/uL — ABNORMAL HIGH (ref 4.0–10.5)
nRBC: 0 % (ref 0.0–0.2)

## 2019-08-23 LAB — C-REACTIVE PROTEIN: CRP: 20.1 mg/dL — ABNORMAL HIGH (ref <1.0)

## 2019-08-23 LAB — D-DIMER, QUANTITATIVE: D-Dimer, Quant: 4.23 ug/mL-FEU — ABNORMAL HIGH (ref 0.00–0.50)

## 2019-08-23 MED ORDER — DEXTROSE 5 % IV SOLN
INTRAVENOUS | Status: DC
  Filled 2019-08-23: qty 1000

## 2019-08-23 MED ORDER — QUETIAPINE FUMARATE 50 MG PO TABS
50.0000 mg | ORAL_TABLET | Freq: Every day | ORAL | Status: DC
  Administered 2019-08-23 – 2019-08-30 (×8): 50 mg via ORAL
  Filled 2019-08-23: qty 2
  Filled 2019-08-23: qty 1
  Filled 2019-08-23: qty 2
  Filled 2019-08-23: qty 1
  Filled 2019-08-23: qty 2
  Filled 2019-08-23 (×3): qty 1

## 2019-08-23 NOTE — Progress Notes (Signed)
Pt arrived via transport alert, awake and confused. Pt tx to bed where he is increasingly resistant to care, pt grabbing at this writers arms. Unable to redirect at this time as he seems highly agitated. V/S recorded, skin assessed with RN, dry and intact, foley care with chg wipes performed at this time. Pt arrived with safety mittens already on and in place.

## 2019-08-23 NOTE — Progress Notes (Signed)
Pt had several short periods of being asleep and awake this shift. Writer checked on pt several times as pt moves a lot in bed, Clinical research associate and another rn repositioned several times this shift. Later in shift, pt much more calm but still resistant. Was able to give tylenol and pt cooperated. Pt also drank fluids and ate a pudding and tolerated it well. More foley care performed and skin moisturized. Pt answered simple yes/no questions for this writer at this time. Will cont to monitor.

## 2019-08-23 NOTE — Plan of Care (Signed)

## 2019-08-23 NOTE — Progress Notes (Signed)
Spoke to [facility], wife, to update patient condition. Currently on room air. With sat of 93-95%. Fair to good appetite. IVF changed.

## 2019-08-23 NOTE — Progress Notes (Signed)
Dr. Natale Milch noted of RR in the upper 30's to 40 while awake. ST with pvc and intermittent psvt, low grade temp of 99.4 ax. Good appetite for dinner, consumed 90%.

## 2019-08-23 NOTE — Progress Notes (Signed)
Tachypnea in the rate of 30's, thereby increasing MEWS score to 4, known at other facility at which time pt is anxious and agitated. Pt slightly grimacing and scratching abdomen with mitten hands. Morphine 1 mg IV given. Spo2 93% on room air, however placed on 2 liters [place] for comfort with sat of 97%. Rapid response nurse notified, will continue to monitor pt.

## 2019-08-23 NOTE — Progress Notes (Signed)
PROGRESS NOTE    Donald [name]  HCW:[id number] DOB: [birth year] DOA: 08/19/2019 PCP: Jani Gravel, MD    Brief Narrative: Donald [name] is a 79 y.o. male with medical history significant of dementia, hypertension, recent COVID-55 infection for which patient was admitted and discharged 08/09/2019 2 08/12/2019 at which point he had syncope acute kidney injury as well as shortness of breath.  Patient was discharged to follow-up with PCP.  He has apparently been weak and debilitated since discharge.  He was brought back today by wife due to worsening symptoms especially weakness.  Patient is unable to give adequate history.  He was found to be tachypneic but no evidence of pneumonia.  Patient noted to have acute kidney injury.  He has had decreased oral intake since discharge.  Appears to be dehydrated.  Patient is therefore being admitted with early sepsis especially with fever most likely post Covid syndrome with early pneumonia or UTI.  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  -Hold Actemra dosing given concern for bacterial pneumonia -Continue ceftriaxone; discontinue vancomycin in the setting of AKI below  Acute metabolic encephalopathy on chronic dementia, likely POA - Baseline per wife is essentially nonverbal but patient does  ambulate on his own, feeds himself most days but does not cook, clean, or perform any ADLs as far as bathing, clothing, hygiene. - Patient's wife indicates he is very "stubborn" and can get combative if he is prompted or provoked to do something he does not want to do such as ambulate or eat. - We will continue supportive care with PT OT speech and follow along clinically - Mental status changes likely in the setting of urinary obstruction, uremia, COVID-19 pneumonia.  Acute kidney injury on chronic renal failure.   Lab Results  Component Value Date   CREATININE 2.46 (H) 08/23/2019   CREATININE 5.93 (H) 08/21/2019   CREATININE 3.27 (H) 08/20/2019  - Baseline creatinine 6 months ago was 1.69.  Creatinine peak 5.93 here - now downtrending appropriately after foley placement.. 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  Currently normotensive Will continue with amlodipine  DVT prophylaxis: Heparin subcutaneous Code Status: DNR Family Communication: Wife updated over the phone, lengthy conversation about patient's baseline clinical status and poor prognosis given advanced age, comorbid conditions and current acute illness. Disposition Plan: Patient lives at home with wife who performs all ADLs and care.  Patient was at adult daycare until the recent outbreak of Covid.  Tentative disposition at this time is likely back home with home health with wife although if patient does not improve drastically SNF may be reasonable pending clinical course  Consultants:   Nephrology  Subjective: No acute issues or events overnight, patient remains nonverbal which is not far from his baseline per discussion with wife.  Patient poorly interactive, somewhat  somnolent; review of systems markedly limited  Objective: Vitals:   08/23/19 0800 08/23/19 1000 08/23/19 1200 08/23/19 1300  BP: 110/68  (!) 128/101   Pulse:  (!) 101    Resp: (!) 24 (!) 29  (!) 30  Temp:      TempSrc:      SpO2:  97%    Weight:      Height:        Intake/Output Summary (Last 24 hours) at 08/23/2019 1349 Last data filed at 08/23/2019 1252 Gross per 24 hour  Intake 1608.2 ml  Output 3650 ml  Net -2041.8 ml   Filed Weights   08/20/19 0500 08/21/19 0708 08/22/19 1122  Weight: 68.8 kg 75 kg 75.2 kg    Examination:  General: Cachectic elderly appearing gentleman, no acute distress, nonverbal, poorly interactive -withdraws to pain HEENT:  Normocephalic atraumatic.  Sclerae nonicteric, noninjected.  Extraocular movements intact bilaterally. Neck:  Without mass or deformity.  Trachea is midline. Lungs:  Clear to auscultate bilaterally without rhonchi, wheeze, or rales. Heart:  Regular rate and rhythm.  Without murmurs, rubs, or gallops. Abdomen:  Soft, nontender, nondistended.  Without guarding or rebound. Extremities: Without cyanosis, clubbing, edema, or obvious deformity. Vascular:  Dorsalis pedis and posterior tibial pulses palpable bilaterally.   Data Reviewed: I have personally reviewed following labs and imaging studies  CBC: Recent Labs  Lab 08/19/19 1605 08/20/19 0307 08/21/19 0311 08/23/19 1003  WBC 12.2* 16.9* 20.4* 11.2*  NEUTROABS 10.5*  --   --   --   HGB 15.9 16.1 16.2 14.7  HCT 51.8 54.1* 53.3* 46.5  MCV 85.2 86.7 84.9 81.4  PLT 307 292 278 017   Basic Metabolic Panel: Recent Labs  Lab 08/19/19 1605 08/20/19 0307 08/21/19 0311 08/23/19 1003  NA 146* 147* 149* 151*  K 3.8 4.0 4.4 3.6  CL 108 113* 118* 116*  CO2 23 19* 17* 25  GLUCOSE 109* 124* 106* 114*  BUN 43* 47* 61* 32*  CREATININE 2.43* 3.27* 5.93* 2.46*  CALCIUM 8.6* 8.3* 7.7* 8.2*  MG  --   --  2.1  --   PHOS  --   --  3.2  --    GFR: Estimated Creatinine  Clearance: 23.2 mL/min (A) (by C-G formula based on SCr of 2.46 mg/dL (H)). Liver Function Tests: Recent Labs  Lab 08/19/19 1605 08/20/19 0307 08/21/19 0311 08/23/19 1003  AST 137* 137* 89* 76*  ALT 65* 59* 45* 40  ALKPHOS 51 53 57 58  BILITOT 0.9 0.9 0.8 0.5  PROT 7.7 7.4 7.3 6.8  ALBUMIN 3.0* 2.8* 2.6* 2.3*   Sepsis Labs: Recent Labs  Lab 08/19/19 1605 08/19/19 1816  LATICACIDVEN 3.1* 2.0*    Recent Results (from the past 240 hour(s))  Culture, blood (routine x 2)     Status: None (Preliminary result)   Collection Time: 08/22/19  8:08 AM   Specimen: BLOOD  Result Value Ref Range Status   Specimen Description   Final    BLOOD RIGHT ARM Performed at [facility], [place] [address] [place], [place] [address]    Special Requests   Final    BOTTLES DRAWN AEROBIC ONLY Blood Culture adequate volume Performed at [facility] [address] [place], [place] [address]    Culture   Final  NO GROWTH < 24 HOURS Performed at [facility] [address] [place], [place] [address]    Report Status PENDING  Incomplete  Culture, blood (routine x 2)     Status: None (Preliminary result)   Collection Time: 08/22/19  8:12 AM   Specimen: BLOOD RIGHT HAND  Result Value Ref Range Status   Specimen Description   Final    BLOOD RIGHT HAND Performed at [facility] [address] [place], [place] [address]    Special Requests   Final    BOTTLES DRAWN AEROBIC ONLY Blood Culture results may not be optimal due to an inadequate volume of blood received in culture bottles Performed at [facility] [address] [place], [place] [address]    Culture   Final    NO GROWTH < 24 HOURS Performed at [facility] [address], [place], [place] [address]    Report Status PENDING  Incomplete     Radiology Studies: US RENAL  Result Date: 08/22/2019 CLINICAL DATA:  Hypertension, dementia, COVID  positive EXAM: RENAL / URINARY TRACT ULTRASOUND COMPLETE COMPARISON:  CT abdomen 10/02/2018 FINDINGS: Right Kidney: Renal measurements: 11.2 x 5.9 x 6.0 cm = volume: 206 mL. Mild hydronephrosis. Two prominent anechoic cysts measuring 4 cm and 2.5 cm. No complexity Left Kidney: Renal measurements: 11.6 x 4.7 x 5.7 cm = volume: 163 mL. Mild hydronephrosis. Large anechoic cyst measuring 4 cm.  No complexity. Bladder: Bladder collapsed around Foley catheter. Other: None. IMPRESSION: 1. Mild bilateral hydronephrosis. 2. Bilateral benign appearing renal cysts. 3. Foley catheter within bladder. Electronically Signed   By: Suzy Bouchard M.D.   On: 08/22/2019 10:52   DG Chest Port 1 View  Addendum Date: 08/22/2019   ADDENDUM REPORT: 08/22/2019 10:49 ADDENDUM: Correction: Impression 2. Subtle bilateral airspace disease would be consistent with viral pneumonia Electronically Signed   By: Suzy Bouchard M.D.   On: 08/22/2019 10:49   Result Date: 08/22/2019 CLINICAL DATA:  Difficulty breathing COVID positive EXAM: PORTABLE CHEST 1 VIEW COMPARISON:  08/21/2019 positive COVID FINDINGS: A stable mediastinum and cardiac silhouette. There is subtle bilateral airspace densities. Low lung volumes. No pneumothorax IMPRESSION: 1. No interval change. 2. Subtle bilateral airspace densities could would be consistent with viral pneumonia. Electronically Signed: By: Suzy Bouchard M.D. On: 08/22/2019 08:39    Scheduled Meds: . amLODipine  5 mg Oral BID  . Chlorhexidine Gluconate Cloth  6 each Topical Daily  . heparin  5,000 Units Subcutaneous Q8H  . vancomycin variable dose per unstable renal function (pharmacist dosing)   Does not apply See admin instructions   Continuous Infusions: . cefTRIAXone (ROCEPHIN)  IV 2 g (08/22/19 2127)  . dextrose 150 mL/hr at 08/23/19 1327     LOS: 4 days    Time spent: [place], [place] [facility] Pager [phone]   If 7PM-7AM, please contact  night-coverage [email or web address] 08/23/2019, 1:49 PM

## 2019-08-23 NOTE — Progress Notes (Signed)
FYI- Pt v/s may not be accurate due to pt moving a lot constantly.

## 2019-08-23 NOTE — Progress Notes (Signed)
[facility] Progress Note    79 y/o man HTN dementia recent COVID+ admitted 1/4-7. Here with weakness and with AKI + fevers -> IVF w/ worsening of renal fx. 2000 ml UOP after foley cath placed with mild b/l hydro on u/s.  Home meds:  - amlodipine 10/ lisinopril 40  - pantoprazole 40  - decadron 6 mg daily  Assessment/ Plan:   1. AoCKD 3 - baseline creat 1.4- 1.8.  Most likely this is obstructive neph from urinary retention.  Labs not done today have ordered for now.  > 2000 cc out w/ foley placement this am.  Pt is not a dialysis candidate given comorbidities. DNR.  Supportive care. No vol^ on exam. Will follow.  - Renal function is improving fortunately. 2. Hypernatremia/ met acidosis - Acidosis has resolved.   Free water deficit of 3.3L not accounting for any ongoing losses. Changed IVF to D5W at 150/hr and will check free water clearance as well.  3. Urinary retention - keep foley in for now, o/w per primary  4. Dementia, marked 5. COVID + PNA 6. DNR  Subjective:   Confused with mittens on.  Had to be repositioned overnight. Tolerating PO's.    Objective:   BP 110/68   Pulse (!) 118   Temp 99.5 F (37.5 C) (Axillary)   Resp (!) 24   Ht '5\' 8"'  (1.727 m)   Wt 75.2 kg   SpO2 99%   BMI 25.21 kg/m   Intake/Output Summary (Last 24 hours) at 08/23/2019 1240 Last data filed at 08/23/2019 0900 Gross per 24 hour  Intake 498.2 ml  Output 3050 ml  Net -2551.8 ml   Weight change: 0.2 kg  Physical Exam: Gen nonverbal, won't respond to commands, resists exam if I try to move his arms and becomes agitated and combative No jvd or bruits Chest bilat coarse basilar rales and rhonchi RRR no MRG Abd soft ntnd no mass or ascites +bs GU normal male w foley draining clear yellow urine MS no joint effusions or deformity Ext no sig edema Neuro as above  Imaging: US RENAL  Result Date: 08/22/2019 CLINICAL DATA:  Hypertension, dementia, COVID positive EXAM: RENAL /  URINARY TRACT ULTRASOUND COMPLETE COMPARISON:  CT abdomen 10/02/2018 FINDINGS: Right Kidney: Renal measurements: 11.2 x 5.9 x 6.0 cm = volume: 206 mL. Mild hydronephrosis. Two prominent anechoic cysts measuring 4 cm and 2.5 cm. No complexity Left Kidney: Renal measurements: 11.6 x 4.7 x 5.7 cm = volume: 163 mL. Mild hydronephrosis. Large anechoic cyst measuring 4 cm.  No complexity. Bladder: Bladder collapsed around Foley catheter. Other: None. IMPRESSION: 1. Mild bilateral hydronephrosis. 2. Bilateral benign appearing renal cysts. 3. Foley catheter within bladder. Electronically Signed   By: Suzy Bouchard M.D.   On: 08/22/2019 10:52   DG Chest Port 1 View  Addendum Date: 08/22/2019   ADDENDUM REPORT: 08/22/2019 10:49 ADDENDUM: Correction: Impression 2. Subtle bilateral airspace disease would be consistent with viral pneumonia Electronically Signed   By: Suzy Bouchard M.D.   On: 08/22/2019 10:49   Result Date: 08/22/2019 CLINICAL DATA:  Difficulty breathing COVID positive EXAM: PORTABLE CHEST 1 VIEW COMPARISON:  08/21/2019 positive COVID FINDINGS: A stable mediastinum and cardiac silhouette. There is subtle bilateral airspace densities. Low lung volumes. No pneumothorax IMPRESSION: 1. No interval change. 2. Subtle bilateral airspace densities could would be consistent with viral pneumonia. Electronically Signed: By: Suzy Bouchard M.D. On: 08/22/2019 08:39    Labs: BMET Recent Labs  Lab 08/19/19  1605 08/20/19 0307 08/21/19 0311 08/23/19 1003  NA 146* 147* 149* 151*  K 3.8 4.0 4.4 3.6  CL 108 113* 118* 116*  CO2 23 19* 17* 25  GLUCOSE 109* 124* 106* 114*  BUN 43* 47* 61* 32*  CREATININE 2.43* 3.27* 5.93* 2.46*  CALCIUM 8.6* 8.3* 7.7* 8.2*  PHOS  --   --  3.2  --    CBC Recent Labs  Lab 08/19/19 1605 08/20/19 0307 08/21/19 0311 08/23/19 1003  WBC 12.2* 16.9* 20.4* 11.2*  NEUTROABS 10.5*  --   --   --   HGB 15.9 16.1 16.2 14.7  HCT 51.8 54.1* 53.3* 46.5  MCV 85.2 86.7 84.9  81.4  PLT 307 292 278 281    Medications:    . amLODipine  5 mg Oral BID  . Chlorhexidine Gluconate Cloth  6 each Topical Daily  . heparin  5,000 Units Subcutaneous Q8H  . vancomycin variable dose per unstable renal function (pharmacist dosing)   Does not apply See admin instructions      Otelia Santee, MD 08/23/2019, 12:40 PM

## 2019-08-24 LAB — BASIC METABOLIC PANEL
Anion gap: 8 (ref 5–15)
BUN: 26 mg/dL — ABNORMAL HIGH (ref 8–23)
CO2: 24 mmol/L (ref 22–32)
Calcium: 7.9 mg/dL — ABNORMAL LOW (ref 8.9–10.3)
Chloride: 115 mmol/L — ABNORMAL HIGH (ref 98–111)
Creatinine, Ser: 1.84 mg/dL — ABNORMAL HIGH (ref 0.61–1.24)
GFR calc Af Amer: 39 mL/min — ABNORMAL LOW (ref >60)
GFR calc non Af Amer: 34 mL/min — ABNORMAL LOW (ref >60)
Glucose, Bld: 123 mg/dL — ABNORMAL HIGH (ref 70–99)
Potassium: 3.8 mmol/L (ref 3.5–5.1)
Sodium: 147 mmol/L — ABNORMAL HIGH (ref 135–145)

## 2019-08-24 LAB — PROCALCITONIN: Procalcitonin: 0.47 ng/mL

## 2019-08-24 LAB — VANCOMYCIN, RANDOM: Vancomycin Rm: 5

## 2019-08-24 MED ORDER — SODIUM CHLORIDE 0.9 % IV SOLN
200.0000 mg | Freq: Once | INTRAVENOUS | Status: AC
  Administered 2019-08-24: 10:00:00 200 mg via INTRAVENOUS
  Filled 2019-08-24: qty 40

## 2019-08-24 MED ORDER — SODIUM CHLORIDE 0.9 % IV SOLN
100.0000 mg | Freq: Every day | INTRAVENOUS | Status: AC
  Administered 2019-08-25 – 2019-08-28 (×4): 100 mg via INTRAVENOUS
  Filled 2019-08-24 (×4): qty 20

## 2019-08-24 MED ORDER — DEXAMETHASONE SODIUM PHOSPHATE 10 MG/ML IJ SOLN
6.0000 mg | INTRAMUSCULAR | Status: AC
  Administered 2019-08-24 – 2019-08-29 (×6): 6 mg via INTRAVENOUS
  Filled 2019-08-24 (×6): qty 1

## 2019-08-24 MED ORDER — VANCOMYCIN HCL 1250 MG/250ML IV SOLN
1250.0000 mg | Freq: Once | INTRAVENOUS | Status: DC
  Filled 2019-08-24: qty 250

## 2019-08-24 NOTE — Progress Notes (Signed)
PROGRESS NOTE    Donald [name]  QQV:[id number] DOB: May 10, 1941 DOA: 08/19/2019 PCP: Jani Gravel, MD    Brief Narrative: Donald [name] is a 79 y.o. male with medical history significant of dementia, hypertension, recent COVID-38 infection for which patient was admitted and discharged 08/09/2019 2 08/12/2019 at which point he had syncope acute kidney injury as well as shortness of breath. Patient was discharged to follow-up with PCP. He has apparently been weak and debilitated since discharge. He was brought back today by wife due to worsening symptoms especially weakness. Patient is unable to give adequate history. He was found to be tachypneic but no evidence of pneumonia. Patient noted to have acute kidney injury. He has had decreased oral intake since discharge. Appears to be dehydrated. Patient is therefore being admitted with early sepsis especially with fever most likely post Covid syndrome with early pneumonia or UTI.  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[place])   Hypertension   Acute hypoxic respiratory failure in the setting COVID-19 pneumonia POA, improving Rule out concurrent bacterial pneumonia  Patient met sepsis criteria at admission SpO2: 95 % O2 Flow Rate (L/min): 2 L/min Recent Labs    08/23/19 0947 08/23/19 1003  DDIMER  --  4.23*  CRP 20.1*  --   - CXR personally reviewed, consistent with bilateral patchy infiltrate concerning for acute pneumonia in the setting of COVID-19 - Remdesivir/Dexamethasone per protocol 5 and 10 days respectively - Sepsis secondary to pneumonia due to COVID-19 virus infection   Patient noted with fever, tachycardia, tachypnea and leukocytosis.  - Hold Actemra dosing given concern for concurrent bacterial pneumonia - Completed ceftriaxone; vancomycin previously DC'd in the setting of AKI below  Acute metabolic encephalopathy on  chronic dementia, likely POA - Baseline per wife is essentially nonverbal but patient does ambulate on his own, feeds himself most days but does not cook, clean, or perform any ADLs as far as bathing, clothing, hygiene, etc. - Patient's wife indicates he is very "stubborn" and can get combative if he is prompted or provoked to do something he does not want to do such as ambulate or eat. - We will continue supportive care with PT/OT/SLP and follow along clinically - Any acute mental status changes likely in the setting of urinary obstruction, uremia, COVID-19 pneumonia - which are all resolving  Acute kidney injury on chronic renal failure Lab Results  Component Value Date   CREATININE 1.84 (H) 08/24/2019   CREATININE 2.46 (H) 08/23/2019   CREATININE 5.93 (H) 08/21/2019  - Baseline creatinine 6 months ago was 1.69.  Creatinine peak 5.93 here - now downtrending appropriately after foley placement. 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  Currently normotensive - Will continue with amlodipine   DVT prophylaxis: Heparin subQ Code Status: DNR Family Communication: Wife updated over the phone, lengthy conversation about patient's baseline clinical status and poor prognosis given advanced age, comorbid conditions and current acute illness. Disposition Plan: Patient lives at home with wife who performs all ADLs and care.  Patient was at adult daycare until the recent outbreak of Covid at their facility. Tentative disposition at this time is likely back home with home health/hospital at home with wife although if patient does not improve  drastically SNF may be reasonable pending clinical course and wife's ability to care for the patient appropriately  (she is concerned about not seeing him or him becoming altered/confused/combative at new facility).  Consultants:   Nephrology  Subjective: No acute issues or events overnight, patient remains nonverbal which is his baseline per discussion with wife.  Patient poorly interactive, somewhat somnolent; review of systems markedly limited - attempting to tolerate PO with some assistance this am.  Objective: Vitals:   08/24/19 0800 08/24/19 0901 08/24/19 1000 08/24/19 1200  BP: (!) 151/74  126/81 (!) 148/92  Pulse: 100  100   Resp: (!) 26  (!) 22 (!) 24  Temp: 99.6 F (37.6 C)   99 F (37.2 C)  TempSrc: Axillary  Axillary   SpO2: 97% 97% 95% 95%  Weight:      Height:        Intake/Output Summary (Last 24 hours) at 08/24/2019 1500 Last data filed at 08/24/2019 1400 Gross per 24 hour  Intake 1118.52 ml  Output 1400 ml  Net -281.48 ml   Filed Weights   08/21/19 0708 08/22/19 1122 08/24/19 0500  Weight: 75 kg 75.2 kg 76 kg    Examination:  General: Cachectic elderly appearing gentleman, no acute distress, nonverbal, poorly interactive -withdraws to pain HEENT:  Normocephalic atraumatic.  Sclerae nonicteric, noninjected.  Extraocular movements intact bilaterally. Neck:  Without mass or deformity.  Trachea is midline. Lungs:  Clear to auscultate bilaterally without rhonchi, wheeze, or rales. Heart:  Regular rate and rhythm.  Without murmurs, rubs, or gallops. Abdomen:  Soft, nontender, nondistended.  Without guarding or rebound. Extremities: Without cyanosis, clubbing, edema, or obvious deformity. Vascular:  Dorsalis pedis and posterior tibial pulses palpable bilaterally.   Data Reviewed: I have personally reviewed following labs and imaging studies  CBC: Recent Labs  Lab 08/19/19 1605 08/20/19 0307 08/21/19 0311 08/23/19 1003  WBC 12.2* 16.9* 20.4* 11.2*  NEUTROABS 10.5*  --   --   --   HGB 15.9 16.1 16.2 14.7  HCT 51.8 54.1* 53.3* 46.5  MCV 85.2 86.7 84.9 81.4  PLT  307 292 278 161   Basic Metabolic Panel: Recent Labs  Lab 08/19/19 1605 08/20/19 0307 08/21/19 0311 08/23/19 1003 08/24/19 0438  NA 146* 147* 149* 151* 147*  K 3.8 4.0 4.4 3.6 3.8  CL 108 113* 118* 116* 115*  CO2 23 19* 17* 25 24  GLUCOSE 109* 124* 106* 114* 123*  BUN 43* 47* 61* 32* 26*  CREATININE 2.43* 3.27* 5.93* 2.46* 1.84*  CALCIUM 8.6* 8.3* 7.7* 8.2* 7.9*  MG  --   --  2.1  --   --   PHOS  --   --  3.2  --   --    GFR: Estimated Creatinine Clearance: 31 mL/min (A) (by C-G formula based on SCr of 1.84 mg/dL (H)). Liver Function Tests: Recent Labs  Lab 08/19/19 1605 08/20/19 0307 08/21/19 0311 08/23/19 1003  AST 137* 137* 89* 76*  ALT 65* 59* 45* 40  ALKPHOS 51 53 57 58  BILITOT 0.9 0.9 0.8 0.5  PROT 7.7 7.4 7.3 6.8  ALBUMIN 3.0* 2.8* 2.6* 2.3*   Sepsis Labs: Recent Labs  Lab 08/19/19 1605 08/19/19 1816 08/24/19 0438  PROCALCITON  --   --  0.47  LATICACIDVEN 3.1* 2.0*  --     Recent Results (from the past 240 hour(s))  Culture, blood (routine x 2)     Status: None (Preliminary result)   Collection Time: 08/22/19  8:08 AM   Specimen: BLOOD  Result Value Ref Range Status   Specimen Description   Final    BLOOD RIGHT ARM Performed at [facility] [address] [place], [place] [address]    Special Requests   Final    BOTTLES DRAWN AEROBIC ONLY Blood Culture adequate volume Performed at [facility] [address], [place], [place] [address]    Culture   Final    NO GROWTH 2 DAYS Performed at [facility] [address] [place], [place] [address]    Report Status PENDING  Incomplete  Culture, blood (routine x 2)     Status: None (Preliminary result)   Collection Time: 08/22/19  8:12 AM   Specimen: BLOOD RIGHT HAND  Result Value Ref Range Status   Specimen Description   Final    BLOOD RIGHT HAND Performed at [facility] [address], [place], [place] [address]    Special  Requests   Final    BOTTLES DRAWN AEROBIC ONLY Blood Culture results may not be optimal due to an inadequate volume of blood received in culture bottles Performed at [facility] [address], [place], [place] [address]    Culture   Final    NO GROWTH 2 DAYS Performed at [facility] [address], [place], [place] [address]    Report Status PENDING  Incomplete     Radiology Studies: No results found.  Scheduled Meds: . amLODipine  5 mg Oral BID  . Chlorhexidine Gluconate Cloth  6 each Topical Daily  . dexamethasone (DECADRON) injection  6 mg Intravenous Q24H  . heparin  5,000 Units Subcutaneous Q8H  . QUEtiapine  50 mg Oral QHS   Continuous Infusions: . dextrose 150 mL/hr at 08/23/19 1327  . [START ON 08/25/2019] remdesivir 100 mg in NS 100 mL       LOS: 5 days    Time spent: [place], [place] [facility]   [email or web address] 08/24/2019, 3:00 PM

## 2019-08-24 NOTE — Progress Notes (Signed)
  [facility] Progress Note   79 y/o man HTN dementia recent COVID+ admitted 1/4-7. Here with weakness and with AKI + fevers -> IVF w/ worsening of renal fx. 2000 ml UOP after foley cath placed with mild b/l hydro on u/s.  Home meds: - amlodipine 10/ lisinopril 40 - pantoprazole 40 - decadron 6 mg daily  Assessment/ Plan:   1. AoCKD 3 - baseline creat 1.4- 1.8. Most likely this is obstructive neph from urinary retention. Labs not done today have ordered for now. >2000 cc out w/ foley placement this am. Pt is not a dialysis candidate given comorbidities. DNR. Supportive care. No vol^ on exam.  - Renal function is improving fortunately as of yesterday; I ordered a stat BMP for today. - CXR  2. Hypernatremia/ met acidosis - Acidosis has resolved.   Free water deficit of 3.3L not accounting for any ongoing losses. Changed IVF to D5W at 150/hr;  free water clearance ~678m (losing free water of 6033m24hr) which explains why Na was increasing. Problem is he needs free water or the SNa will continue to rise.   3. Urinary retention - keep foley in for now, o/w per primary  4. Dementia, marked 5. COVID + PNA 6. DNR  Subjective:   Confused and not really arousable with any attention to follow commands. Tachypneic.  Fluids had to be turned off last night.    Objective:   BP (!) 151/74 (BP Location: Right Leg)   Pulse 100   Temp 99.6 F (37.6 C) (Axillary)   Resp (!) 26   Ht '5\' 8"'$  (1.727 m)   Wt 76 kg   SpO2 97%   BMI 25.48 kg/m   Intake/Output Summary (Last 24 hours) at 08/24/2019 1113 Last data filed at 08/24/2019 0600 Gross per 24 hour  Intake 2028.52 ml  Output 1550 ml  Net 478.52 ml   Weight change: 0.8 kg  Physical Exam: Gennonverbal, won't respond to commands, resists exam if I try to move his arms and becomes agitated and combative No jvd or bruits Chestbilat coarse basilar rales and rhonchi RRR no MRG Abd soft ntnd no mass or ascites  +bs GU normal malew foley draining clear yellow urine MS no joint effusions or deformity Extno sig edema Neuroas above  Imaging: No results found.  Labs: BMET Recent Labs  Lab 08/19/19 1605 08/20/19 0307 08/21/19 0311 08/23/19 1003  NA 146* 147* 149* 151*  K 3.8 4.0 4.4 3.6  CL 108 113* 118* 116*  CO2 23 19* 17* 25  GLUCOSE 109* 124* 106* 114*  BUN 43* 47* 61* 32*  CREATININE 2.43* 3.27* 5.93* 2.46*  CALCIUM 8.6* 8.3* 7.7* 8.2*  PHOS  --   --  3.2  --    CBC Recent Labs  Lab 08/19/19 1605 08/20/19 0307 08/21/19 0311 08/23/19 1003  WBC 12.2* 16.9* 20.4* 11.2*  NEUTROABS 10.5*  --   --   --   HGB 15.9 16.1 16.2 14.7  HCT 51.8 54.1* 53.3* 46.5  MCV 85.2 86.7 84.9 81.4  PLT 307 292 278 281    Medications:    . amLODipine  5 mg Oral BID  . Chlorhexidine Gluconate Cloth  6 each Topical Daily  . dexamethasone (DECADRON) injection  6 mg Intravenous Q24H  . heparin  5,000 Units Subcutaneous Q8H  . QUEtiapine  50 mg Oral QHS      JaOtelia SanteeMD 08/24/2019, 11:13 AM

## 2019-08-24 NOTE — Consult Note (Signed)
   [facility] Inpatient Consult   08/24/2019  Donald [name] 23-Mar-1941 [id number]    Follow-Up Note:   Following this Medicare/ [employer] patient with 7 day and 30 day readmissions, who transferred from [facility] to [facility]) on 08/22/19. [Acute hypoxic respiratory failure in the setting COVID-19 pneumonia POA, improving Rule out concurrent bacterial pneumonia- met sepsis criteria at admission; Acute metabolic encephalopathy on chronic dementia, likely POA]  Plan:  Continue to follow progress and disposition recommendations to assess for post hospital care management needs. Tentative disposition at this time per MD note is likely back home with home health/hospital at home with wife, although if patient does not improve drastically, SNF (skilled nursing facility) may be reasonable pending clinical course and wife's ability to care for the patient appropriately.   Please place a [facility] Care Management consult for follow-up as appropriate.    Of note, [facility] Care Management services does not replace or interfere with any services that are arranged by transition of care case management or social work.    For questions and referrals, please contact:   Edwena Felty A. Kace Hartje, BSN, RN-BC [facility] Liaison Cell: [phone]

## 2019-08-25 DIAGNOSIS — J1282 Pneumonia due to coronavirus disease 2019: Secondary | ICD-10-CM

## 2019-08-25 DIAGNOSIS — N1831 Chronic kidney disease, stage 3a: Secondary | ICD-10-CM

## 2019-08-25 DIAGNOSIS — N170 Acute kidney failure with tubular necrosis: Secondary | ICD-10-CM

## 2019-08-25 DIAGNOSIS — U071 COVID-19: Secondary | ICD-10-CM

## 2019-08-25 DIAGNOSIS — J9601 Acute respiratory failure with hypoxia: Secondary | ICD-10-CM

## 2019-08-25 LAB — COMPREHENSIVE METABOLIC PANEL
ALT: 40 U/L (ref 0–44)
AST: 57 U/L — ABNORMAL HIGH (ref 15–41)
Albumin: 2.1 g/dL — ABNORMAL LOW (ref 3.5–5.0)
Alkaline Phosphatase: 54 U/L (ref 38–126)
Anion gap: 10 (ref 5–15)
BUN: 24 mg/dL — ABNORMAL HIGH (ref 8–23)
CO2: 22 mmol/L (ref 22–32)
Calcium: 7.6 mg/dL — ABNORMAL LOW (ref 8.9–10.3)
Chloride: 108 mmol/L (ref 98–111)
Creatinine, Ser: 1.5 mg/dL — ABNORMAL HIGH (ref 0.61–1.24)
GFR calc Af Amer: 50 mL/min — ABNORMAL LOW (ref >60)
GFR calc non Af Amer: 43 mL/min — ABNORMAL LOW (ref >60)
Glucose, Bld: 150 mg/dL — ABNORMAL HIGH (ref 70–99)
Potassium: 4.1 mmol/L (ref 3.5–5.1)
Sodium: 140 mmol/L (ref 135–145)
Total Bilirubin: 0.8 mg/dL (ref 0.3–1.2)
Total Protein: 6.2 g/dL — ABNORMAL LOW (ref 6.5–8.1)

## 2019-08-25 LAB — CBC
HCT: 41 % (ref 39.0–52.0)
Hemoglobin: 13 g/dL (ref 13.0–17.0)
MCH: 25.9 pg — ABNORMAL LOW (ref 26.0–34.0)
MCHC: 31.7 g/dL (ref 30.0–36.0)
MCV: 81.8 fL (ref 80.0–100.0)
Platelets: 304 10*3/uL (ref 150–400)
RBC: 5.01 MIL/uL (ref 4.22–5.81)
RDW: 15.2 % (ref 11.5–15.5)
WBC: 9.2 10*3/uL (ref 4.0–10.5)
nRBC: 0 % (ref 0.0–0.2)

## 2019-08-25 LAB — D-DIMER, QUANTITATIVE: D-Dimer, Quant: 4.88 ug/mL-FEU — ABNORMAL HIGH (ref 0.00–0.50)

## 2019-08-25 LAB — C-REACTIVE PROTEIN: CRP: 18.7 mg/dL — ABNORMAL HIGH (ref <1.0)

## 2019-08-25 NOTE — Progress Notes (Signed)
PROGRESS NOTE    Donald [name]  OJJ:[id number] DOB: [birth year] DOA: 08/19/2019 PCP: Pearson Grippe, MD   Brief Narrative:  79 y.o.BM PMHx dementia, hypertension, recent COVID-19 infection for which patient was admitted and discharged 08/09/2019 - 08/12/2019 at which point he had syncope acute kidney injury as well as shortness of breath.Patient was discharged to follow-up with PCP. He has apparently been weak and debilitated since discharge.   Brought back today by wife due to worsening symptoms especially weakness. Patient is unable to give adequate history. He was found to be tachypneic but no evidence of pneumonia. Patient noted to have acute kidney injury. He has had decreased oral intake since discharge. Appears to be dehydrated. Patient is therefore being admitted with early sepsis especially with fever most likely post Covid syndrome with early pneumonia or UTI.   Subjective: Afebrile last 24 hours A/O x1 (does not know where, when, why).  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 -Actemra held secondary to possible concurrent bacterial pneumonia -On previous admission completed course of ceftriaxone+ vancomycin see brief narrative  Bacterial pneumonia -Procalcitonin not consistent with bacterial  pneumonia.  Acute metabolic encephalopathy/chronic dementia (POA) -Per EMR Baseline per wife is essentially nonverbal but patient does ambulate on his own, feeds himself most days but does not cook, clean, or perform any ADLs as far as bathing, clothing, hygiene, etc. -Per EMR patient's wife indicates he is very "stubborn" and can get combative if he is prompted or provoked to do something he does not want to do such as ambulate or eat. -Patient most likely at baseline  Acute kidney injury on chronic renal failure (baseline Cr 6 months ago 1.69) Recent Labs  Lab 08/20/19 0307 08/21/19 0311 08/23/19 1003 08/24/19 0438 08/25/19 0218  CREATININE 3.27* 5.93* 2.46* 1.84* 1.50*  -Nephrology following -Continue Foley, with most likely continued discharge with a follow-up with urology.  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  Currently normotensive - Will continue with amlodipine  Goals of care -1/20 PT/OT consult placed; do not believe patient can currently care for himself at home even with the help of his wife.  Has been readmitted within 30 days.  Per EMR Patient lives at home with wife who performs all ADLs and care.  Patient was at adult daycare until the recent outbreak of Covid at their facility. Tentative disposition at this time is likely back home with home health/hospital at home with wife although if patient does not improve drastically SNF may be reasonable pending clinical course and wife's ability to care for the patient appropriately (she is concerned about not seeing him or him becoming altered/confused/combative at new facility).    DVT prophylaxis: Subcu heparin Code Status: DNR Family Communication:  Disposition Plan:   Consultants:  Nephrology   Procedures/Significant Events:     I have personally reviewed and interpreted all radiology  studies and my findings are as above.  VENTILATOR SETTINGS: Room air 1/20 SPO2 99%   Cultures   Antimicrobials: Anti-infectives (From admission, onward)   Start     Dose/Rate Stop   08/25/19 1000  remdesivir 100 mg in sodium chloride 0.9 % 100 mL IVPB     100 mg 200 mL/hr over 30 Minutes 08/29/19 0959   08/24/19 0945  remdesivir 200 mg in sodium chloride 0.9% 250 mL IVPB     200 mg 580 mL/hr over 30 Minutes 08/24/19 1748   08/24/19 0930  vancomycin (VANCOREADY) IVPB 1250 mg/250 mL  Status:  Discontinued     1,250 mg 166.7 mL/hr over 90 Minutes 08/24/19 0841   08/22/19 2100  cefTRIAXone (ROCEPHIN) 2 g in sodium chloride 0.9 % 100 mL IVPB  Status:  Discontinued     2 g 200 mL/hr over 30 Minutes 08/24/19 0745   08/22/19 0800  vancomycin (VANCOREADY) IVPB 1500 mg/300 mL     1,500 mg 150 mL/hr over 120 Minutes 08/22/19 1049   08/22/19 0745  vancomycin variable dose per unstable renal function (pharmacist dosing)  Status:  Discontinued      08/23/19 1358   08/19/19 2100  cefTRIAXone (ROCEPHIN) 1 g in sodium chloride 0.9 % 100 mL IVPB  Status:  Discontinued     1 g 200 mL/hr over 30 Minutes 08/22/19 0745       Devices    LINES / TUBES:      Continuous Infusions: . dextrose 150 mL/hr at 08/24/19 1635  . remdesivir 100 mg in NS 100 mL       Objective: Vitals:   08/24/19 2000 08/25/19 0430 08/25/19 0500 08/25/19 0758  BP: (!) 166/82 (!) 176/78  129/68  Pulse: 100 95  87  Resp: (!) 22 20  19   Temp: 98.8 F (37.1 C) 98.4 F (36.9 C)  98.6 F (37 C)  TempSrc: Axillary Axillary  Axillary  SpO2: 95% 93%  99%  Weight:   76.2 kg   Height:        Intake/Output Summary (Last 24 hours) at 08/25/2019 08/27/2019 Last data filed at 08/25/2019 08/27/2019 Gross per 24 hour  Intake 200 ml  Output 1100 ml  Net -900 ml   Filed Weights   08/22/19 1122 08/24/19 0500 08/25/19 0500  Weight: 75.2 kg 76 kg 76.2 kg    Examination:  General: A/O x1 (does not know where, when, why),  follows most commands, no acute respiratory distress Eyes: negative scleral hemorrhage, negative anisocoria, negative icterus ENT: Negative Runny nose, negative gingival bleeding, Neck:  Negative scars, masses, torticollis, lymphadenopathy, JVD Lungs: Clear to auscultation bilaterally without wheezes or crackles Cardiovascular: Regular rate and rhythm without murmur gallop or rub normal S1 and S2 Abdomen: negative abdominal pain, nondistended, positive soft, bowel sounds, no rebound, no ascites, no appreciable mass Extremities: No significant cyanosis, clubbing, or edema bilateral lower extremities Skin: Negative rashes, lesions, ulcers Psychiatric: Unable to evaluate secondary to AMS  Central nervous system:  Cranial nerves II through XII intact, tongue/uvula midline, moves extremities to command, extremely weak. .     Data Reviewed: Care during the described time interval was provided by me .  I have reviewed this patient's available data, including medical history, events of note, physical examination, and all test results as part of my evaluation.   CBC: Recent Labs  Lab 08/19/19 1605 08/20/19  6948 08/21/19 0311 08/23/19 1003 08/25/19 0218  WBC 12.2* 16.9* 20.4* 11.2* 9.2  NEUTROABS 10.5*  --   --   --   --   HGB 15.9 16.1 16.2 14.7 13.0  HCT 51.8 54.1* 53.3* 46.5 41.0  MCV 85.2 86.7 84.9 81.4 81.8  PLT 307 292 278 281 304   Basic Metabolic Panel: Recent Labs  Lab 08/20/19 0307 08/21/19 0311 08/23/19 1003 08/24/19 0438 08/25/19 0218  NA 147* 149* 151* 147* 140  K 4.0 4.4 3.6 3.8 4.1  CL 113* 118* 116* 115* 108  CO2 19* 17* 25 24 22   GLUCOSE 124* 106* 114* 123* 150*  BUN 47* 61* 32* 26* 24*  CREATININE 3.27* 5.93* 2.46* 1.84* 1.50*  CALCIUM 8.3* 7.7* 8.2* 7.9* 7.6*  MG  --  2.1  --   --   --   PHOS  --  3.2  --   --   --    GFR: Estimated Creatinine Clearance: 38 mL/min (A) (by C-G formula based on SCr of 1.5 mg/dL (H)). Liver Function Tests: Recent Labs  Lab  08/19/19 1605 08/20/19 0307 08/21/19 0311 08/23/19 1003 08/25/19 0218  AST 137* 137* 89* 76* 57*  ALT 65* 59* 45* 40 40  ALKPHOS 51 53 57 58 54  BILITOT 0.9 0.9 0.8 0.5 0.8  PROT 7.7 7.4 7.3 6.8 6.2*  ALBUMIN 3.0* 2.8* 2.6* 2.3* 2.1*   No results for input(s): LIPASE, AMYLASE in the last 168 hours. No results for input(s): AMMONIA in the last 168 hours. Coagulation Profile: No results for input(s): INR, PROTIME in the last 168 hours. Cardiac Enzymes: No results for input(s): CKTOTAL, CKMB, CKMBINDEX, TROPONINI in the last 168 hours. BNP (last 3 results) No results for input(s): PROBNP in the last 8760 hours. HbA1C: No results for input(s): HGBA1C in the last 72 hours. CBG: No results for input(s): GLUCAP in the last 168 hours. Lipid Profile: No results for input(s): CHOL, HDL, LDLCALC, TRIG, CHOLHDL, LDLDIRECT in the last 72 hours. Thyroid Function Tests: No results for input(s): TSH, T4TOTAL, FREET4, T3FREE, THYROIDAB in the last 72 hours. Anemia Panel: No results for input(s): VITAMINB12, FOLATE, FERRITIN, TIBC, IRON, RETICCTPCT in the last 72 hours. Urine analysis:    Component Value Date/Time   COLORURINE AMBER (A) 09/18/2018 2355   APPEARANCEUR HAZY (A) 09/18/2018 2355   LABSPEC 1.023 09/18/2018 2355   PHURINE 5.0 09/18/2018 2355   GLUCOSEU NEGATIVE 09/18/2018 2355   HGBUR NEGATIVE 09/18/2018 2355   BILIRUBINUR MODERATE (A) 09/18/2018 2355   KETONESUR NEGATIVE 09/18/2018 2355   PROTEINUR 30 (A) 09/18/2018 2355   UROBILINOGEN 0.2 02/02/2014 1804   NITRITE NEGATIVE 09/18/2018 2355   LEUKOCYTESUR NEGATIVE 09/18/2018 2355   Sepsis Labs: @LABRCNTIP (procalcitonin:4,lacticidven:4)  ) Recent Results (from the past 240 hour(s))  Culture, blood (routine x 2)     Status: None (Preliminary result)   Collection Time: 08/22/19  8:08 AM   Specimen: BLOOD  Result Value Ref Range Status   Specimen Description   Final    BLOOD RIGHT ARM Performed at [facility], [address], [place], [place]    Special Requests   Final    BOTTLES DRAWN AEROBIC ONLY Blood Culture adequate volume Performed at [facility], [address], [place], [place]    Culture   Final    NO GROWTH 3 DAYS Performed at [facility], [address] [place], [address] [place]    Report Status  PENDING  Incomplete  Culture, blood (routine x 2)     Status: None (Preliminary result)   Collection Time: 08/22/19  8:12 AM   Specimen: BLOOD RIGHT HAND  Result Value Ref Range Status   Specimen Description   Final    BLOOD RIGHT HAND Performed at [place] [address], [place], [place] [address]    Special Requests   Final    BOTTLES DRAWN AEROBIC ONLY Blood Culture results may not be optimal due to an inadequate volume of blood received in culture bottles Performed at [facility] [address], [place], [place] [address]    Culture   Final    NO GROWTH 3 DAYS Performed at [facility], [place] [address] [place], [place] [address]    Report Status PENDING  Incomplete         Radiology Studies: No results found.      Scheduled Meds: . amLODipine  5 mg Oral BID  . Chlorhexidine Gluconate Cloth  6 each Topical Daily  . dexamethasone (DECADRON) injection  6 mg Intravenous Q24H  . heparin  5,000 Units Subcutaneous Q8H  . QUEtiapine  50 mg Oral QHS   Continuous Infusions: . dextrose 150 mL/hr at 08/24/19 1635  . remdesivir 100 mg in NS 100 mL       LOS: 6 days   The patient is critically ill with multiple organ systems failure and requires high complexity decision making for assessment and support, frequent evaluation and titration of therapies, application of advanced monitoring technologies and extensive interpretation of multiple databases. Critical Care Time devoted to patient care services described in this note  Time spent: 40 minutes     Sylvan Lahm,  Geraldo Docker, MD [facility] Pager [phone]  If 7PM-7AM, please contact night-coverage [email or web address] Password [email or web address] 08/25/2019, 8:07 AM

## 2019-08-25 NOTE — Progress Notes (Signed)
  Speech Language Pathology Treatment: Dysphagia  Patient Details Name: Donald [name] MRN: [id number] DOB: [birth year] Today's Date: 08/25/2019 Time: 4081-4481 SLP Time Calculation (min) (ACUTE ONLY): 15 min  Assessment / Plan / Recommendation Clinical Impression  Pt pleasant during swallow intervention with goal of possible texture upgrade. He has upper denture plate donned, none on bottom and demonstrated prolonged mastication > 4 minutes with one small bite cracker and partially removed by therapist. Strategies utilized to facilitate transit included additional bites puree, liquid wash with eventual transit. Multiple straw sips thin elicited immediate cough eliminated by smaller sips. For safest, most efficient and nutritive, pt should remain on puree (D1) texture, thin liquids, pills crushed, straws allowed small sips. Pt can be followed at next venue of care for texture upgrade. ST will sign off.     HPI HPI: Donald [name] is a 79 y.o. male with medical history significant of dementia, hypertension, recent COVID-19 infection for which patient was admitted and discharged 08/09/2019 2 08/12/2019 at which point he had syncope acute kidney injury as well as shortness of breath.  Patient was discharged to follow-up with PCP.  He has apparently been weak and debilitated with decreased PO intake since discharge.  He was brought back by wife due to worsening symptoms especially weakness.   He was found to be tachypneic but no evidence of pneumonia.  Patient noted to have acute kidney injury.  He has had decreased oral intake since discharge.  Appears to be dehydrated.  Pt seen by ST for a BSE on 08/11/19 with recommendations for regular solids and thin liquids.      SLP Plan  All goals met       Recommendations  Diet recommendations: Dysphagia 1 (puree);Thin liquid Liquids provided via: Straw Medication Administration: Crushed with puree Supervision: Staff to assist with self  feeding;Full supervision/cueing for compensatory strategies;Patient able to self feed Compensations: Minimize environmental distractions;Slow rate;Small sips/bites;Lingual sweep for clearance of pocketing Postural Changes and/or Swallow Maneuvers: Seated upright 90 degrees                Oral Care Recommendations: Oral care BID Follow up Recommendations: Skilled Nursing facility;24 hour supervision/assistance SLP Visit Diagnosis: Dysphagia, oral phase (R13.11) Plan: All goals met                       Houston Siren 08/25/2019, 3:07 PM [phone]

## 2019-08-25 NOTE — Plan of Care (Signed)

## 2019-08-26 LAB — CBC
HCT: 42.7 % (ref 39.0–52.0)
Hemoglobin: 13.4 g/dL (ref 13.0–17.0)
MCH: 25.5 pg — ABNORMAL LOW (ref 26.0–34.0)
MCHC: 31.4 g/dL (ref 30.0–36.0)
MCV: 81.2 fL (ref 80.0–100.0)
Platelets: 345 10*3/uL (ref 150–400)
RBC: 5.26 MIL/uL (ref 4.22–5.81)
RDW: 14.9 % (ref 11.5–15.5)
WBC: 9.6 10*3/uL (ref 4.0–10.5)
nRBC: 0 % (ref 0.0–0.2)

## 2019-08-26 LAB — GLUCOSE, CAPILLARY
Glucose-Capillary: 189 mg/dL — ABNORMAL HIGH (ref 70–99)
Glucose-Capillary: 202 mg/dL — ABNORMAL HIGH (ref 70–99)

## 2019-08-26 LAB — COMPREHENSIVE METABOLIC PANEL
ALT: 41 U/L (ref 0–44)
AST: 52 U/L — ABNORMAL HIGH (ref 15–41)
Albumin: 2 g/dL — ABNORMAL LOW (ref 3.5–5.0)
Alkaline Phosphatase: 63 U/L (ref 38–126)
Anion gap: 13 (ref 5–15)
BUN: 30 mg/dL — ABNORMAL HIGH (ref 8–23)
CO2: 18 mmol/L — ABNORMAL LOW (ref 22–32)
Calcium: 7.6 mg/dL — ABNORMAL LOW (ref 8.9–10.3)
Chloride: 107 mmol/L (ref 98–111)
Creatinine, Ser: 1.52 mg/dL — ABNORMAL HIGH (ref 0.61–1.24)
GFR calc Af Amer: 49 mL/min — ABNORMAL LOW (ref >60)
GFR calc non Af Amer: 43 mL/min — ABNORMAL LOW (ref >60)
Glucose, Bld: 197 mg/dL — ABNORMAL HIGH (ref 70–99)
Potassium: 4.1 mmol/L (ref 3.5–5.1)
Sodium: 138 mmol/L (ref 135–145)
Total Bilirubin: 0.3 mg/dL (ref 0.3–1.2)
Total Protein: 6.3 g/dL — ABNORMAL LOW (ref 6.5–8.1)

## 2019-08-26 LAB — HEMOGLOBIN A1C
Hgb A1c MFr Bld: 6.4 % — ABNORMAL HIGH (ref 4.8–5.6)
Mean Plasma Glucose: 136.98 mg/dL

## 2019-08-26 LAB — C-REACTIVE PROTEIN: CRP: 8.1 mg/dL — ABNORMAL HIGH (ref <1.0)

## 2019-08-26 LAB — D-DIMER, QUANTITATIVE: D-Dimer, Quant: 6.14 ug/mL-FEU — ABNORMAL HIGH (ref 0.00–0.50)

## 2019-08-26 MED ORDER — INSULIN ASPART 100 UNIT/ML ~~LOC~~ SOLN
0.0000 [IU] | SUBCUTANEOUS | Status: DC
  Administered 2019-08-26: 3 [IU] via SUBCUTANEOUS
  Administered 2019-08-26: 2 [IU] via SUBCUTANEOUS
  Administered 2019-08-27: 20:00:00 3 [IU] via SUBCUTANEOUS
  Administered 2019-08-27: 2 [IU] via SUBCUTANEOUS
  Administered 2019-08-27: 3 [IU] via SUBCUTANEOUS
  Administered 2019-08-27 – 2019-08-28 (×3): 1 [IU] via SUBCUTANEOUS
  Administered 2019-08-28: 3 [IU] via SUBCUTANEOUS
  Administered 2019-08-28: 5 [IU] via SUBCUTANEOUS
  Administered 2019-08-29: 3 [IU] via SUBCUTANEOUS
  Administered 2019-08-29: 2 [IU] via SUBCUTANEOUS
  Administered 2019-08-30: 06:00:00 1 [IU] via SUBCUTANEOUS
  Administered 2019-08-30: 2 [IU] via SUBCUTANEOUS
  Administered 2019-08-30: 12:00:00 1 [IU] via SUBCUTANEOUS

## 2019-08-26 NOTE — Progress Notes (Signed)
Occupational Therapy Evaluation Only  Clinical Impression: PTA pt lived with his wife, dependent for all self-care tasks except feeding. Pt able to sit edge of bed ~15 min with supervision, noting 0 instances of loss of balance. Pt stood one time with max hand held assist x 2, unable to follow instructions to side step over to bedside chair. Utilized sara stedy to transfer to bedside chair. RN notified and updated. Pt currently requires total assist for all self-care tasks at the hospital including feeding. Anticipate that unfamiliar environment is increasing pt's confusion and restlessness as well as impacting his ability to feed himself. No further skilled OT services warranted at this time as pt is functioning near baseline for ADLs. OT will sign off at this time.    08/26/19 1101  OT Visit Information  Last OT Received On 08/26/19  Assistance Needed +2  PT/OT/SLP Co-Evaluation/Treatment Yes  Reason for Co-Treatment Complexity of the patient's impairments (multi-system involvement);Necessary to address cognition/behavior during functional activity;For patient/therapist safety;To address functional/ADL transfers  OT goals addressed during session ADL's and self-care  History of Present Illness Pt is 79 y.o. male with medical history significant of hypertension, dementia, was brought in by his wife after she found him in the bathroom passed out. Pt found to be COVID positive.  Precautions  Precautions Fall;Other (comment)  Precaution Comments Bilateral mitts in place.  Restrictions  Weight Bearing Restrictions No  Home Living  Family/patient expects to be discharged to: Private residence  Living Arrangements Spouse/significant other  Available Help at Discharge Family  Type of [place] to enter  Entrance Stairs-Number of Steps 2 with bilateral handrails  Entrance Stairs-Rails Can reach both  [place] One level  Bathroom Chartered certified accountant  Handicapped height  Bathroom Accessibility Yes  [place] held shower head;Shower seat;BSC  Additional Comments Wife does all driving. He went to [facility] 5 days a week. He was able to feed self, but required assistance for all ADLs- bathing , dressing, toileting and shaving.  Prior Function  Level of Independence Needs assistance  Gait / Transfers Assistance Needed Pt ambulated without an assistive device, with wife reporting 0 falls.   ADL's / Homemaking Assistance Needed Wife assists with all ADLs, however pt is able to feed himself.  Communication  Communication Other (comment) (Pt responds to name inconsistently. Pt able to state name.)  Pain Assessment  Pain Assessment Faces  Faces Pain Scale 0  Cognition  Arousal/Alertness Awake/alert  Behavior During Therapy Restless;Flat affect;Impulsive  Overall Cognitive Status History of cognitive impairments - at baseline  General Comments Pt has a hx of dementia at baseline. Pt restless, attempting to pull bilateral mitts off throughout session. Pt able to state name. Pt responds to name inconsistently.   Upper Extremity Assessment  Upper Extremity Assessment Difficult to assess due to impaired cognition (Pt resistive to all movements. )  Lower Extremity Assessment  Lower Extremity Assessment Defer to PT evaluation  ADL  Overall ADL's  At baseline  General ADL Comments Pt continues to require total assist for all self-care tasks here at the hospital. Pt does however require assist for feeding currently. Predict this is due to unfamiliar environment. Pt has also been more restless with bilateral mitts in place.  Bed Mobility  Overal bed mobility Needs Assistance  Bed Mobility Supine to Sit  Supine to sit Mod assist;Max assist;+2 for physical assistance  General bed mobility comments Cues to initiate tasks. HOB elevated.  Use of bedrail  Transfers  Overall transfer level Needs assistance  Equipment used  (sara stedy)   Transfer via Psychologist, prison and probation services Sit to/from [employer] Transfers  Sit to Stand Max assist;+2 physical assistance;+2 safety/equipment  Stand pivot transfers Total assist;+2 physical assistance;+2 safety/equipment (with use of sara stedy)  General transfer comment Pt completed sit/stand with 2 person max hand held assist. Pt unable to follow cues to side step over to bedside chair. Utilized sara stedy for stand pivot to bedside chair with pt requiring max verbal and tactile cues to sit once moved to the chair.   Balance  Overall balance assessment Needs assistance  Sitting balance-Leahy Scale Fair  Standing balance-Leahy Scale Poor  General Comments  General comments (skin integrity, edema, etc.) Pt on room air with SpO2 maintaining in 90s throughout. Pt follows one step instructions inconsistently despite max verbal and tactile cues. Difficulty initiating and sequencing steps.   OT - End of Session  Equipment Utilized During Treatment Gait belt;Other (comment) (sara stedy)  Activity Tolerance Other (comment) (Limited due to cognitive deficits.)  Patient left in chair;with call bell/phone within reach;with chair alarm set  Nurse Communication Mobility status;Need for lift equipment  OT Assessment  OT Recommendation/Assessment Patient does not need any further OT services  OT Visit Diagnosis Unsteadiness on feet (R26.81)  OT Problem List Cardiopulmonary status limiting activity  AM-PAC OT "6 Clicks" Daily Activity Outcome Measure (Version 2)  Help from another person eating meals? 1  Help from another person taking care of personal grooming? 1  Help from another person toileting, which includes using toliet, bedpan, or urinal? 1  Help from another person bathing (including washing, rinsing, drying)? 1  Help from another person to put on and taking off regular upper body clothing? 1  Help from another person to put on and taking off regular lower body clothing? 1  6  Click Score 6  OT Recommendation  Follow Up Recommendations No OT follow up  OT Equipment None recommended by OT  Acute Rehab OT Goals  Patient Stated Goal Pt unable to state  OT Time Calculation  OT Start Time (ACUTE ONLY) 0850  OT Stop Time (ACUTE ONLY) 0925  OT Time Calculation (min) 35 min  OT General Charges  $OT Visit 1 Visit  OT Evaluation  $OT Eval Moderate Complexity 1 Mod  Written Expression  Dominant Hand Right    Peterson Ao OTR/L [phone]

## 2019-08-26 NOTE — Progress Notes (Addendum)
PROGRESS NOTE    Donald [name]  SMO:[id number] DOB: [birth year] DOA: 08/19/2019 PCP: Pearson Grippe, MD   Brief Narrative:  79 y.o.BM PMHx dementia, hypertension, recent COVID-19 infection for which patient was admitted and discharged 08/09/2019 - 08/12/2019 at which point he had syncope acute kidney injury as well as shortness of breath.Patient was discharged to follow-up with PCP. He has apparently been weak and debilitated since discharge.   Brought back today by wife due to worsening symptoms especially weakness. Patient is unable to give adequate history. He was found to be tachypneic but no evidence of pneumonia. Patient noted to have acute kidney injury. He has had decreased oral intake since discharge. Appears to be dehydrated. Patient is therefore being admitted with early sepsis especially with fever most likely post Covid syndrome with early pneumonia or UTI.   Subjective: 1/21 afebrile overnight A/O x1 (does not know where, when, why).  Follows commands much more awake and interactive today.   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 -Actemra held secondary to possible concurrent bacterial pneumonia -On previous admission completed course of ceftriaxone+ vancomycin see brief narrative  Bacterial pneumonia -Procalcitonin not consistent with  bacterial pneumonia.  Acute metabolic encephalopathy/chronic dementia (POA) -Per EMR Baseline per wife is essentially nonverbal but patient does ambulate on his own, feeds himself most days but does not cook, clean, or perform any ADLs as far as bathing, clothing, hygiene, etc. -Per EMR patient's wife indicates he is very "stubborn" and can get combative if he is prompted or provoked to do something he does not want to do such as ambulate or eat. -Patient most likely at baseline  Acute kidney injury on chronic renal failure (baseline Cr 6 months ago 1.69) Recent Labs  Lab 08/21/19 0311 08/23/19 1003 08/24/19 0438 08/25/19 0218 08/26/19 0208  CREATININE 5.93* 2.46* 1.84* 1.50* 1.52*  -Nephrology following -Patient currently able to void without Foley.  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  Currently normotensive - Will continue with amlodipine 5 mg BID  Goals of care -1/20 PT/OT consult placed; do not believe patient can currently care for himself at home even with the help of his wife.  Has been readmitted within 30 days.  Per EMR Patient lives at home with wife who performs all ADLs and care.  Patient was at adult daycare until the recent outbreak of Covid at their facility. Tentative disposition at this time is likely back home with home health/hospital at home with wife although if patient does not improve drastically SNF may be reasonable pending clinical course and wife's ability to care for the patient appropriately (she is concerned about not seeing him or him becoming altered/confused/combative at new facility). -1/21 PT recommends SNF, consult placed to LCSW for placement   DVT prophylaxis: Subcu heparin Code Status: DNR Family Communication: 1/21  [name] (wife) counseled on plan of care and answered all questions Disposition Plan:   Consultants:   Nephrology   Procedures/Significant Events:     I have personally reviewed and interpreted all radiology studies and my findings are as above.  VENTILATOR SETTINGS: Room air 1/21 SPO2 98%   Cultures   Antimicrobials: Anti-infectives (From admission, onward)   Start     Dose/Rate Stop   08/25/19 1000  remdesivir 100 mg in sodium chloride 0.9 % 100 mL IVPB     100 mg 200 mL/hr over 30 Minutes 08/29/19 0959   08/24/19 0945  remdesivir 200 mg in sodium chloride 0.9% 250 mL IVPB     200 mg 580 mL/hr over 30 Minutes 08/24/19 1748   08/24/19 0930  vancomycin (VANCOREADY) IVPB 1250 mg/250 mL  Status:  Discontinued     1,250 mg 166.7 mL/hr over 90 Minutes 08/24/19 0841   08/22/19 2100  cefTRIAXone (ROCEPHIN) 2 g in sodium chloride 0.9 % 100 mL IVPB  Status:  Discontinued     2 g 200 mL/hr over 30 Minutes 08/24/19 0745   08/22/19 0800  vancomycin (VANCOREADY) IVPB 1500 mg/300 mL     1,500 mg 150 mL/hr over 120 Minutes 08/22/19 1049   08/22/19 0745  vancomycin variable dose per unstable renal function (pharmacist dosing)  Status:  Discontinued      08/23/19 1358   08/19/19 2100  cefTRIAXone (ROCEPHIN) 1 g in sodium chloride 0.9 % 100 mL IVPB  Status:  Discontinued     1 g 200 mL/hr over 30 Minutes 08/22/19 0745       Devices    LINES / TUBES:      Continuous Infusions: . dextrose 150 mL/hr at 08/26/19 6269  . remdesivir 100 mg in NS 100 mL 100 mg (08/26/19 0845)     Objective: Vitals:   08/25/19 2006 08/26/19 0420 08/26/19 0717 08/26/19 1051  BP: (!) 138/94 (!) 145/72    Pulse: 89 92 88   Resp: (!) 24  18   Temp: (!) 97.5 F (36.4 C) 97.6 F (36.4 C) 98.4 F (36.9 C)   TempSrc: Axillary Oral Axillary   SpO2: 98% 97% 98% 98%  Weight:      Height:        Intake/Output Summary (Last 24 hours) at 08/26/2019 1212 Last data filed at 08/26/2019 4854 Gross per 24 hour  Intake 60 ml  Output 3300 ml  Net -3240 ml   Filed Weights   08/22/19 1122 08/24/19  0500 08/25/19 0500  Weight: 75.2 kg 76 kg 76.2 kg   Physical Exam:  General: A/O x1 (does not know where, when, why) follows commands no acute respiratory distress Eyes: negative scleral hemorrhage, negative anisocoria, negative icterus ENT: Negative Runny nose, negative gingival bleeding, Neck:  Negative scars, masses, torticollis, lymphadenopathy, JVD Lungs: Clear to auscultation bilaterally without wheezes or crackles Cardiovascular: Regular rate and rhythm without murmur gallop or rub normal S1 and S2 Abdomen: negative abdominal pain, nondistended, positive soft, bowel sounds, no rebound, no ascites, no appreciable mass Extremities: No significant cyanosis, clubbing, or edema bilateral lower extremities Skin: Negative rashes, lesions, ulcers Psychiatric:  Negative depression, negative anxiety, negative fatigue, negative mania  Central nervous system:  Cranial nerves II through XII intact, tongue/uvula midline, all extremities muscle strength 5/5, sensation intact throughout, finger nose finger bilateral within normal limits, quick finger touch bilateral within normal limits, unstable on his feet, 1-2 assist when ambulating.  Negative dysarthria, negative expressive aphasia, negative receptive  aphasia.     Data Reviewed: Care during the described time interval was provided by me .  I have reviewed this patient's available data, including medical history, events of note, physical examination, and all test results as part of my evaluation.   CBC: Recent Labs  Lab 08/19/19 1605 08/19/19 1605 08/20/19 0307 08/21/19 0311 08/23/19 1003 08/25/19 0218 08/26/19 0208  WBC 12.2*   < > 16.9* 20.4* 11.2* 9.2 9.6  NEUTROABS 10.5*  --   --   --   --   --   --   HGB 15.9   < > 16.1 16.2 14.7 13.0 13.4  HCT 51.8   < > 54.1* 53.3* 46.5 41.0 42.7  MCV 85.2   < > 86.7 84.9 81.4 81.8 81.2  PLT 307   < > 292 278 281 304 345   < > = values in this interval not displayed.   Basic Metabolic  Panel: Recent Labs  Lab 08/21/19 0311 08/23/19 1003 08/24/19 0438 08/25/19 0218 08/26/19 0208  NA 149* 151* 147* 140 138  K 4.4 3.6 3.8 4.1 4.1  CL 118* 116* 115* 108 107  CO2 17* 25 24 22  18*  GLUCOSE 106* 114* 123* 150* 197*  BUN 61* 32* 26* 24* 30*  CREATININE 5.93* 2.46* 1.84* 1.50* 1.52*  CALCIUM 7.7* 8.2* 7.9* 7.6* 7.6*  MG 2.1  --   --   --   --   PHOS 3.2  --   --   --   --    GFR: Estimated Creatinine Clearance: 37.5 mL/min (A) (by C-G formula based on SCr of 1.52 mg/dL (H)). Liver Function Tests: Recent Labs  Lab 08/20/19 0307 08/21/19 0311 08/23/19 1003 08/25/19 0218 08/26/19 0208  AST 137* 89* 76* 57* 52*  ALT 59* 45* 40 40 41  ALKPHOS 53 57 58 54 63  BILITOT 0.9 0.8 0.5 0.8 0.3  PROT 7.4 7.3 6.8 6.2* 6.3*  ALBUMIN 2.8* 2.6* 2.3* 2.1* 2.0*   No results for input(s): LIPASE, AMYLASE in the last 168 hours. No results for input(s): AMMONIA in the last 168 hours. Coagulation Profile: No results for input(s): INR, PROTIME in the last 168 hours. Cardiac Enzymes: No results for input(s): CKTOTAL, CKMB, CKMBINDEX, TROPONINI in the last 168 hours. BNP (last 3 results) No results for input(s): PROBNP in the last 8760 hours. HbA1C: No results for input(s): HGBA1C in the last 72 hours. CBG: No results for input(s): GLUCAP in the last 168 hours. Lipid Profile: No results for input(s): CHOL, HDL, LDLCALC, TRIG, CHOLHDL, LDLDIRECT in the last 72 hours. Thyroid Function Tests: No results for input(s): TSH, T4TOTAL, FREET4, T3FREE, THYROIDAB in the last 72 hours. Anemia Panel: No results for input(s): VITAMINB12, FOLATE, FERRITIN, TIBC, IRON, RETICCTPCT in the last 72 hours. Urine analysis:    Component Value Date/Time   COLORURINE AMBER (A) 09/18/2018 2355   APPEARANCEUR HAZY (A) 09/18/2018 2355   LABSPEC 1.023 09/18/2018 2355   PHURINE 5.0 09/18/2018 2355   GLUCOSEU NEGATIVE 09/18/2018 2355   HGBUR NEGATIVE 09/18/2018 2355   BILIRUBINUR MODERATE (A)  09/18/2018 2355   KETONESUR NEGATIVE 09/18/2018 2355   PROTEINUR 30 (A) 09/18/2018 2355   UROBILINOGEN 0.2 02/02/2014 1804   NITRITE NEGATIVE 09/18/2018 2355   LEUKOCYTESUR NEGATIVE 09/18/2018 2355   Sepsis Labs: @LABRCNTIP (procalcitonin:4,lacticidven:4)  ) Recent Results (from the past 240 hour(s))  Culture, blood (routine x 2)     Status: None (Preliminary result)   Collection Time: 08/22/19  8:08 AM  Specimen: BLOOD  Result Value Ref Range Status   Specimen Description   Final    BLOOD RIGHT ARM Performed at [facility] [address] [place], [place] [address]    Special Requests   Final    BOTTLES DRAWN AEROBIC ONLY Blood Culture adequate volume Performed at [facility] [address], [place], [place] [address]    Culture   Final    NO GROWTH 4 DAYS Performed at [facility], [place] [address], [place], [place] [address]    Report Status PENDING  Incomplete  Culture, blood (routine x 2)     Status: None (Preliminary result)   Collection Time: 08/22/19  8:12 AM   Specimen: BLOOD RIGHT HAND  Result Value Ref Range Status   Specimen Description   Final    BLOOD RIGHT HAND Performed at [facility] [address], [place], [place] [address]    Special Requests   Final    BOTTLES DRAWN AEROBIC ONLY Blood Culture results may not be optimal due to an inadequate volume of blood received in culture bottles Performed at [facility] [address] [place], [place] [address]    Culture   Final    NO GROWTH 4 DAYS Performed at [facility], [place] [address] [place], [place] [address]    Report Status PENDING  Incomplete         Radiology Studies: No results found.      Scheduled Meds: . amLODipine  5 mg Oral BID  . Chlorhexidine Gluconate Cloth  6 each Topical Daily  . dexamethasone (DECADRON) injection  6 mg Intravenous Q24H  . heparin  5,000 Units Subcutaneous Q8H   . QUEtiapine  50 mg Oral QHS   Continuous Infusions: . dextrose 150 mL/hr at 08/26/19 0623  . remdesivir 100 mg in NS 100 mL 100 mg (08/26/19 0845)     LOS: 7 days   The patient is critically ill with multiple organ systems failure and requires high complexity decision making for assessment and support, frequent evaluation and titration of therapies, application of advanced monitoring technologies and extensive interpretation of multiple databases. Critical Care Time devoted to patient care services described in this note  Time spent: 40 minutes     Camaryn Lumbert, Geraldo Docker, MD [facility] Pager [phone]  If 7PM-7AM, please contact night-coverage [email or web address] Password [email or web address] 08/26/2019, 12:12 PM

## 2019-08-26 NOTE — Evaluation (Signed)
Physical Therapy Evaluation Patient Details Name: Donald [name] MRN: [id number] DOB: July 27, 1941 Today's Date: 08/26/2019   History of Present Illness  Pt is 79 y.o. male with medical history significant of hypertension, dementia, was brought in by his wife after she found him in the bathroom passed out. Pt found to be COVID positive.  Clinical Impression  He is strong in CORE and LEs but due to dementia he is being resistant to changing positions- significant posterior lean. Used Donald [name] for bed to [facility] chair. He was wearing mits except for when using Donald [name]. Spoke with wifeCorrie [name] , and she indicates he was able to ambulate with no AD, shuffling gait, but that she did all driving and assisted him in all bathing, dressing, toileting, and shaving. He was able to feed self. They live in one level home with 2 outside steps. Tub-shower with HH shower head, no grab bars. BS commode and elevated toilet heights. Attended a [employer] 5 days a week. Did briefly have HH after initial hospitalization. Should benefit from skilled PT to promote return to PLOF, and allow consideration to be discharged to home.    Follow Up Recommendations SNF    Equipment Recommendations       Recommendations for Other Services       Precautions / Restrictions Precautions Precautions: Fall;Other (comment) Precaution Comments: Bilateral mitts in place. Restrictions Weight Bearing Restrictions: No Other Position/Activity Restrictions: He is strong and very resistanto moving, changing positions -> supine to sit on edge of bed, sit<>stand    Used Donald [name] for bed to [facility] chair today.      Mobility  Bed Mobility                  Transfers                    Ambulation/Gait                Stairs            Wheelchair Mobility    Modified Rankin (Stroke Patients Only)       Balance     Sitting balance-Leahy Scale: Fair       Standing balance-Leahy Scale:  Poor                               Pertinent Vitals/Pain Pain Assessment: Faces Faces Pain Scale: No hurt    Home Living Family/patient expects to be discharged to:: Private residence Living Arrangements: Spouse/significant other Available Help at Discharge: Family Type of Home: House Home Access: Stairs to enter Entrance Stairs-Rails: Can reach both Entrance Stairs-Number of Steps: 2 with bilateral handrails Home Layout: One level Home Equipment: Hand held shower head;Shower seat;Bedside commode Additional Comments: Wife does all driving. He went to [employer] 5 days a week. He was able to feed self, but required assistance for all ADLs- bathing , dressing, toileting and shaving.    Prior Function Level of Independence: Needs assistance   Gait / Transfers Assistance Needed: Pt ambulated without an assistive device, with wife reporting 0 falls.   ADL's / Homemaking Assistance Needed: Wife assists with all ADLs, however pt is able to feed himself.        Hand Dominance   Dominant Hand: Right    Extremity/Trunk Assessment   Upper Extremity Assessment Upper Extremity Assessment: Difficult to assess due to impaired cognition(Pt resistive to all movements. )  Lower Extremity Assessment Lower Extremity Assessment: Defer to PT evaluation    Cervical / Trunk Assessment Cervical / Trunk Assessment: Normal  Communication   Communication: Other (comment)(Pt responds to name inconsistently. Pt able to state name.)  Cognition Arousal/Alertness: Awake/alert Behavior During Therapy: Restless;Flat affect;Impulsive Overall Cognitive Status: History of cognitive impairments - at baseline Area of Impairment: Orientation;Memory;Attention(essentially nonverbal throughout evaluations of OT and PT)                         Safety/Judgement: Decreased awareness of safety     General Comments: Pt has a hx of dementia at baseline. Pt restless, attempting to  pull bilateral mitts off throughout session. Pt able to state name. Pt responds to name inconsistently.       General Comments      Exercises General Exercises - Lower Extremity Ankle Circles/Pumps: AAROM;Seated Hip ABduction/ADduction: AAROM;Seated Other Exercises Other Exercises: Encouraged to perform LE ex, but poor return demo.   Assessment/Plan    PT Assessment Patient needs continued PT services  PT Problem List Decreased mobility;Decreased safety awareness;Decreased knowledge of precautions;Decreased activity tolerance;Cardiopulmonary status limiting activity;Decreased balance;Decreased knowledge of use of DME       PT Treatment Interventions DME instruction;Therapeutic activities;Cognitive remediation;Gait training;Therapeutic exercise;Patient/family education;Balance training;Functional mobility training    PT Goals (Current goals can be found in the Care Plan section)  Acute Rehab PT Goals Patient Stated Goal: unable PT Goal Formulation: Patient unable to participate in goal setting Potential to Achieve Goals: Good    Frequency Min 2X/week   Barriers to discharge        Co-evaluation PT/OT/SLP Co-Evaluation/Treatment: Yes Reason for Co-Treatment: Complexity of the patient's impairments (multi-system involvement);Necessary to address cognition/behavior during functional activity;For patient/therapist safety;To address functional/ADL transfers PT goals addressed during session: Strengthening/ROM;Mobility/safety with mobility OT goals addressed during session: ADL's and self-care       AM-PAC PT "6 Clicks" Mobility  Outcome Measure Help needed turning from your back to your side while in a flat bed without using bedrails?: A Lot Help needed moving from lying on your back to sitting on the side of a flat bed without using bedrails?: A Lot Help needed moving to and from a bed to a chair (including a wheelchair)?: A Lot Help needed standing up from a chair using  your arms (e.g., wheelchair or bedside chair)?: A Lot Help needed to walk in hospital room?: Total Help needed climbing 3-5 steps with a railing? : Total 6 Click Score: 10    End of Session Equipment Utilized During Treatment: Gait belt Activity Tolerance: Treatment limited secondary to agitation Patient left: in bed;with call bell/phone within reach Nurse Communication: Mobility status PT Visit Diagnosis: Unsteadiness on feet (R26.81);Other abnormalities of gait and mobility (R26.89)    Time: 2952-8413 PT Time Calculation (min) (ACUTE ONLY): 45 min   Charges:   PT Evaluation $PT Eval Moderate Complexity: 1 Mod PT Treatments $Therapeutic Exercise: 8-22 mins $ therapeutic activity 8-22 mins       Rollen Sox, PT # [phone] CGV cell   Casandra Doffing 08/26/2019, 11:09 AM

## 2019-08-26 NOTE — Progress Notes (Signed)
1410- Updates given to wife. Time allowed for questions and concerns. Patient to transfer to 3rd. Wife verbalized understanding.

## 2019-08-26 NOTE — Progress Notes (Signed)
Pt arrived to room 309 approx 1445. Pt appears comfortable, VSS. No s/s of pain.

## 2019-08-27 LAB — CBC WITH DIFFERENTIAL/PLATELET
Abs Immature Granulocytes: 0.15 10*3/uL — ABNORMAL HIGH (ref 0.00–0.07)
Basophils Absolute: 0 10*3/uL (ref 0.0–0.1)
Basophils Relative: 0 %
Eosinophils Absolute: 0 10*3/uL (ref 0.0–0.5)
Eosinophils Relative: 0 %
HCT: 39.6 % (ref 39.0–52.0)
Hemoglobin: 12.4 g/dL — ABNORMAL LOW (ref 13.0–17.0)
Immature Granulocytes: 1 %
Lymphocytes Relative: 6 %
Lymphs Abs: 0.8 10*3/uL (ref 0.7–4.0)
MCH: 25.6 pg — ABNORMAL LOW (ref 26.0–34.0)
MCHC: 31.3 g/dL (ref 30.0–36.0)
MCV: 81.8 fL (ref 80.0–100.0)
Monocytes Absolute: 0.8 10*3/uL (ref 0.1–1.0)
Monocytes Relative: 6 %
Neutro Abs: 10.4 10*3/uL — ABNORMAL HIGH (ref 1.7–7.7)
Neutrophils Relative %: 87 %
Platelets: 420 10*3/uL — ABNORMAL HIGH (ref 150–400)
RBC: 4.84 MIL/uL (ref 4.22–5.81)
RDW: 14.9 % (ref 11.5–15.5)
WBC: 12.1 10*3/uL — ABNORMAL HIGH (ref 4.0–10.5)
nRBC: 0 % (ref 0.0–0.2)

## 2019-08-27 LAB — CULTURE, BLOOD (ROUTINE X 2)
Culture: NO GROWTH
Culture: NO GROWTH
Special Requests: ADEQUATE

## 2019-08-27 LAB — COMPREHENSIVE METABOLIC PANEL
ALT: 44 U/L (ref 0–44)
AST: 51 U/L — ABNORMAL HIGH (ref 15–41)
Albumin: 2.1 g/dL — ABNORMAL LOW (ref 3.5–5.0)
Alkaline Phosphatase: 51 U/L (ref 38–126)
Anion gap: 11 (ref 5–15)
BUN: 31 mg/dL — ABNORMAL HIGH (ref 8–23)
CO2: 23 mmol/L (ref 22–32)
Calcium: 7.8 mg/dL — ABNORMAL LOW (ref 8.9–10.3)
Chloride: 106 mmol/L (ref 98–111)
Creatinine, Ser: 1.4 mg/dL — ABNORMAL HIGH (ref 0.61–1.24)
GFR calc Af Amer: 55 mL/min — ABNORMAL LOW (ref >60)
GFR calc non Af Amer: 47 mL/min — ABNORMAL LOW (ref >60)
Glucose, Bld: 142 mg/dL — ABNORMAL HIGH (ref 70–99)
Potassium: 4 mmol/L (ref 3.5–5.1)
Sodium: 140 mmol/L (ref 135–145)
Total Bilirubin: 0.4 mg/dL (ref 0.3–1.2)
Total Protein: 6.1 g/dL — ABNORMAL LOW (ref 6.5–8.1)

## 2019-08-27 LAB — MAGNESIUM: Magnesium: 1.8 mg/dL (ref 1.7–2.4)

## 2019-08-27 LAB — PHOSPHORUS: Phosphorus: 2.8 mg/dL (ref 2.5–4.6)

## 2019-08-27 LAB — D-DIMER, QUANTITATIVE: D-Dimer, Quant: 5.62 ug/mL-FEU — ABNORMAL HIGH (ref 0.00–0.50)

## 2019-08-27 LAB — GLUCOSE, CAPILLARY
Glucose-Capillary: 138 mg/dL — ABNORMAL HIGH (ref 70–99)
Glucose-Capillary: 150 mg/dL — ABNORMAL HIGH (ref 70–99)
Glucose-Capillary: 160 mg/dL — ABNORMAL HIGH (ref 70–99)
Glucose-Capillary: 184 mg/dL — ABNORMAL HIGH (ref 70–99)
Glucose-Capillary: 203 mg/dL — ABNORMAL HIGH (ref 70–99)
Glucose-Capillary: 244 mg/dL — ABNORMAL HIGH (ref 70–99)

## 2019-08-27 LAB — C-REACTIVE PROTEIN: CRP: 4 mg/dL — ABNORMAL HIGH (ref <1.0)

## 2019-08-27 LAB — FERRITIN: Ferritin: 386 ng/mL — ABNORMAL HIGH (ref 24–336)

## 2019-08-27 NOTE — NC FL2 (Signed)
[place] MEDICAID FL2 LEVEL OF CARE SCREENING TOOL     IDENTIFICATION  Patient Name: Donald [name] Birthdate: Mar 11, 1941 Sex: male Admission Date (Current Location): 08/19/2019  [facility] and [place] Number:  Producer, television/film/video and Address:  [facility] [place], [address] [place], [place] [address]      Provider Number: [id number]  Attending Physician Name and Address:  Drema Dallas, MD  Relative Name and Phone Number:  Elpidio Thielen [phone]    Current Level of Care: Hospital Recommended Level of Care: Skilled Nursing Facility Prior Approval Number:    Date Approved/Denied:   PASRR Number: [id number]  Discharge Plan: SNF    Current Diagnoses: Patient Active Problem List   Diagnosis Date Noted  . ARF (acute renal failure) (HCC) 08/19/2019  . Near syncope 08/10/2019  . Syncope 08/09/2019  . AKI (acute kidney injury) (HCC) 08/09/2019  . Dementia with behavioral disturbance (HCC) 12/15/2015  . Tonic seizure (HCC) 12/15/2015  . Altered mental status 10/29/2015  . Elevated lactic acid level 10/29/2015  . Encephalopathy acute 10/29/2015  . Enlarged prostate 10/29/2015  . Hypertension 10/29/2015  . 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    Orientation RESPIRATION BLADDER Height & Weight        Normal External catheter Weight: 162 lb 0.6 oz (73.5 kg) Height:  5\' 8"  (172.7 cm)  BEHAVIORAL SYMPTOMS/MOOD NEUROLOGICAL BOWEL NUTRITION STATUS      Incontinent Diet(see discharge summary)  AMBULATORY STATUS COMMUNICATION OF NEEDS Skin   Extensive Assist Non-Verbally Normal                       Personal Care Assistance Level of Assistance  Bathing, Feeding, Dressing Bathing Assistance: Maximum assistance Feeding assistance: Limited assistance Dressing Assistance: Maximum assistance     Functional Limitations Info             SPECIAL CARE FACTORS FREQUENCY  PT (By licensed PT), OT (By licensed OT)      PT Frequency: 5 times a week OT Frequency: 5 times a week            Contractures      Additional Factors Info  Code Status, Isolation Precautions Code Status Info: DNR       Isolation Precautions Info: COVID +     Current Medications (08/27/2019):  This is the current hospital active medication list Current Facility-Administered Medications  Medication Dose Route Frequency Provider Last Rate Last Admin  . acetaminophen (TYLENOL) tablet 650 mg  650 mg Oral Q6H PRN 08/29/2019, MD   650 mg at 08/23/19 0541   Or  . acetaminophen (TYLENOL) suppository 650 mg  650 mg Rectal Q6H PRN 08/25/19, MD   650 mg at 08/21/19 2048  . albuterol (VENTOLIN HFA) 108 (90 Base) MCG/ACT inhaler 2 puff  2 puff Inhalation Q4H PRN Bodenheimer, Charles A, NP      . amLODipine (NORVASC) tablet 5 mg  5 mg Oral BID 2049, MD   5 mg at 08/27/19 0920  . calcium carbonate (dosed in mg elemental calcium) suspension 500 mg of elemental calcium  500 mg of elemental calcium Oral Q6H PRN 08/29/19, MD      . camphor-menthol (SARNA) lotion 1 application  1 application Topical Q8H PRN Rometta Emery, MD       And  . hydrOXYzine (ATARAX/VISTARIL) tablet 25 mg  25 mg Oral Q8H PRN Rometta Emery, MD      .  Chlorhexidine Gluconate Cloth 2 % PADS 6 each  6 each Topical Daily Elie Confer, MD   6 each at 08/27/19 [phone]  . dexamethasone (DECADRON) injection 6 mg  6 mg Intravenous Q24H Little Ishikawa, MD   6 mg at 08/27/19 4008  . dextrose 5 % solution   Intravenous Continuous Dwana Melena, MD 150 mL/hr at 08/27/19 0926 New Bag at 08/27/19 0926  . docusate sodium (ENEMEEZ) enema 283 mg  1 enema Rectal PRN Gala Romney L, MD      . feeding supplement (NEPRO CARB STEADY) liquid 237 mL  237 mL Oral TID PRN Gala Romney L, MD      . heparin injection 5,000 Units  5,000 Units Subcutaneous Q8H Elwyn Reach, MD   5,000 Units at 08/27/19 1327  . insulin aspart (novoLOG)  injection 0-9 Units  0-9 Units Subcutaneous Q4H Allie Bossier, MD   3 Units at 08/27/19 1322  . morphine 2 MG/ML injection 1 mg  1 mg Intravenous Q4H PRN Elie Confer, MD   1 mg at 08/23/19 1435  . ondansetron (ZOFRAN) tablet 4 mg  4 mg Oral Q6H PRN Elwyn Reach, MD       Or  . ondansetron (ZOFRAN) injection 4 mg  4 mg Intravenous Q6H PRN Gala Romney L, MD      . QUEtiapine (SEROQUEL) tablet 50 mg  50 mg Oral QHS Little Ishikawa, MD   50 mg at 08/26/19 2109  . remdesivir 100 mg in sodium chloride 0.9 % 100 mL IVPB  100 mg Intravenous Daily Wendee Beavers, [facility]   Stopped at 08/27/19 6761  . sorbitol 70 % solution 30 mL  30 mL Oral PRN Elwyn Reach, MD         Discharge Medications: Please see discharge summary for a list of discharge medications.  Relevant Imaging Results:  Relevant Lab Results:   Additional Information SS # [id number] Will require 30 days of skilled services  Atilano Median, LCSW

## 2019-08-27 NOTE — TOC Initial Note (Signed)
Transition of Care [facility]) - Initial/Assessment Note    Patient Details  Name: Donald [name] MRN: [id number] Date of Birth: 12/05/1940  Transition of [facility]) CM/SW Contact:    Atilano Median, LCSW Phone Number: 08/27/2019, 4:21 PM  Clinical Narrative:                 Admitted with medical history significant of dementia, hypertension, recent COVID-19 infection for which patient was admitted and discharged 08/09/2019 2 08/12/2019 at which point he had syncope acute kidney injury as well as shortness of breath.   PT recommending SNF. Patient's wife aware and agreeable to this plan. Information faxed out for review.   TOC weekend team available to assist.   Expected Discharge Plan: Skilled Nursing Facility Barriers to Discharge: Continued Medical Work up   Patient Goals and CMS Choice Patient states their goals for this hospitalization and ongoing recovery are:: altered CMS Medicare.gov Compare Post Acute Care list provided to:: Patient Represenative (must comment) Choice offered to / list presented to : Spouse([name] Grieser)  Expected Discharge Plan and Services Expected Discharge Plan: [place] In-house Referral: Clinical Social Work   Post Acute Care Choice: [place] Living arrangements for the past 2 months: [place]                                      Prior Living Arrangements/Services Living arrangements for the past 2 months: Single Family Home Lives with:: Spouse                   Activities of Daily Living Home Assistive Devices/Equipment: Environmental consultant (specify type), Dentures (specify type), Shower chair with back, Raised toilet seat with rails ADL Screening (condition at time of admission) Patient's cognitive ability adequate to safely complete daily activities?: No Is the patient deaf or have difficulty hearing?: Yes Does the patient have difficulty seeing, even when wearing glasses/contacts?:  No Does the patient have difficulty concentrating, remembering, or making decisions?: Yes Patient able to express need for assistance with ADLs?: No Does the patient have difficulty dressing or bathing?: Yes Independently performs ADLs?: No Communication: Needs assistance Is this a change from baseline?: Pre-admission baseline Dressing (OT): Dependent Is this a change from baseline?: Pre-admission baseline Grooming: Dependent Is this a change from baseline?: Pre-admission baseline Feeding: Dependent Is this a change from baseline?: Pre-admission baseline Bathing: Dependent Is this a change from baseline?: Pre-admission baseline Toileting: Dependent Is this a change from baseline?: Pre-admission baseline In/Out Bed: Dependent Is this a change from baseline?: Pre-admission baseline Walks in Home: Dependent Is this a change from baseline?: Pre-admission baseline Does the patient have difficulty walking or climbing stairs?: Yes Weakness of Legs: Both Weakness of Arms/Hands: None  Permission Sought/Granted                  Emotional Assessment              Admission diagnosis:  ARF (acute renal failure) ([place]) [N17.9] AKI (acute kidney injury) ([place]) [N17.9] COVID-19 virus infection [U07.1] Patient Active Problem List   Diagnosis Date Noted  . ARF (acute renal failure) ([place]) 08/19/2019  . Near syncope 08/10/2019  . Syncope 08/09/2019  . AKI (acute kidney injury) ([place]) 08/09/2019  . Dementia with behavioral disturbance ([place]) 12/15/2015  . Tonic seizure ([place]) 12/15/2015  . Altered mental status 10/29/2015  . Elevated lactic acid level 10/29/2015  .  Encephalopathy acute 10/29/2015  . Enlarged prostate 10/29/2015  . Hypertension 10/29/2015  . 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

## 2019-08-27 NOTE — Progress Notes (Signed)
Pt at SOS assessment was non verbal with this RN, not answering questions, nodding and shaking head at times,  Currently pt has become slightly anxios, pullig at IV lines, restless in bed, pulling off mitts, and grimacing with RN care, turning pt to help comfort level. Pt anwers "what" when asked about pain/location and level of, therefore mophine given as per [facility], will follow up and assess effectiveness

## 2019-08-27 NOTE — Progress Notes (Signed)
PROGRESS NOTE    Donald [name]  LFY:[id number] DOB: June 03, 1941 DOA: 08/19/2019 PCP: Pearson Grippe, MD   Brief Narrative:  79 y.o.BM PMHx dementia, hypertension, recent COVID-19 infection for which patient was admitted and discharged 08/09/2019 - 08/12/2019 at which point he had syncope acute kidney injury as well as shortness of breath.Patient was discharged to follow-up with PCP. He has apparently been weak and debilitated since discharge.   Brought back today by wife due to worsening symptoms especially weakness. Patient is unable to give adequate history. He was found to be tachypneic but no evidence of pneumonia. Patient noted to have acute kidney injury. He has had decreased oral intake since discharge. Appears to be dehydrated. Patient is therefore being admitted with early sepsis especially with fever most likely post Covid syndrome with early pneumonia or UTI.   Subjective: 1/22 afebrile last 24 hours A/O x1 (does not know where, when, why).  Follows commands.  Much more sleepy today.   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 -Actemra held secondary to possible concurrent bacterial pneumonia -On previous admission completed course of ceftriaxone+ vancomycin see brief narrative  Bacterial  pneumonia -Procalcitonin not consistent with bacterial pneumonia.  Acute metabolic encephalopathy/chronic dementia (POA) -Per EMR Baseline per wife is essentially nonverbal but patient does ambulate on his own, feeds himself most days but does not cook, clean, or perform any ADLs as far as bathing, clothing, hygiene, etc. -Per EMR patient's wife indicates he is very "stubborn" and can get combative if he is prompted or provoked to do something he does not want to do such as ambulate or eat. -Patient most likely at baseline  Acute kidney injury on chronic renal failure (baseline Cr 6 months ago 1.69) Recent Labs  Lab 08/23/19 1003 08/24/19 0438 08/25/19 0218 08/26/19 0208 08/27/19 0502  CREATININE 2.46* 1.84* 1.50* 1.52* 1.40*  -Nephrology following -Patient currently able to void without Foley.  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  Currently normotensive - Will continue with amlodipine 5 mg BID  Goals of care -1/20 PT/OT consult placed; do not believe patient can currently care for himself at home even with the help of his wife.  Has been readmitted within 30 days.  Per EMR Patient lives at home with wife who performs all ADLs and care.  Patient was at adult daycare until the recent outbreak of Covid at their facility. Tentative disposition at this time is likely back home with home health/hospital at home with wife although if patient does not improve drastically SNF may be reasonable pending clinical course and wife's ability to care for the patient appropriately (she is concerned about not seeing him or him becoming altered/confused/combative at new facility). -1/21 PT recommends SNF, consult placed to LCSW for  placement -1/22 spoke with LCSW Shanita who is working on placement   DVT prophylaxis: Subcu heparin Code Status: DNR Family Communication: 1/21   [name] (wife) counseled on plan of care and answered all questions Disposition Plan:   Consultants:  Nephrology   Procedures/Significant Events:     I have personally reviewed and interpreted all radiology studies and my findings are as above.  VENTILATOR SETTINGS: Room air 1/22 SPO2 100%   Cultures   Antimicrobials: Anti-infectives (From admission, onward)   Start     Dose/Rate Stop   08/25/19 1000  remdesivir 100 mg in sodium chloride 0.9 % 100 mL IVPB     100 mg 200 mL/hr over 30 Minutes 08/29/19 0959   08/24/19 0945  remdesivir 200 mg in sodium chloride 0.9% 250 mL IVPB     200 mg 580 mL/hr over 30 Minutes 08/24/19 1748   08/24/19 0930  vancomycin (VANCOREADY) IVPB 1250 mg/250 mL  Status:  Discontinued     1,250 mg 166.7 mL/hr over 90 Minutes 08/24/19 0841   08/22/19 2100  cefTRIAXone (ROCEPHIN) 2 g in sodium chloride 0.9 % 100 mL IVPB  Status:  Discontinued     2 g 200 mL/hr over 30 Minutes 08/24/19 0745   08/22/19 0800  vancomycin (VANCOREADY) IVPB 1500 mg/300 mL     1,500 mg 150 mL/hr over 120 Minutes 08/22/19 1049   08/22/19 0745  vancomycin variable dose per unstable renal function (pharmacist dosing)  Status:  Discontinued      08/23/19 1358   08/19/19 2100  cefTRIAXone (ROCEPHIN) 1 g in sodium chloride 0.9 % 100 mL IVPB  Status:  Discontinued     1 g 200 mL/hr over 30 Minutes 08/22/19 0745       Devices    LINES / TUBES:      Continuous Infusions: . dextrose 150 mL/hr at 08/27/19 0926  . remdesivir 100 mg in NS 100 mL Stopped (08/27/19 0955)     Objective: Vitals:   08/27/19 0359 08/27/19 0405 08/27/19 0717 08/27/19 0938  BP: 133/87  126/62   Pulse: 82  83   Resp: 18  16   Temp: 98.4 F (36.9 C)  97.9 F (36.6 C)   TempSrc: Axillary  Axillary   SpO2: 98%  (!) 84% 100%  Weight:  73.5 kg    Height:        Intake/Output Summary (Last 24 hours) at 08/27/2019 1224 Last data filed at 08/27/2019 5053 Gross per 24 hour  Intake 9239.47 ml    Output 3550 ml  Net 5689.47 ml   Filed Weights   08/24/19 0500 08/25/19 0500 08/27/19 0405  Weight: 76 kg 76.2 kg 73.5 kg   Physical Exam:  General: A/O x1 (does not know where, when, why) follows commands no acute respiratory distress Eyes: negative scleral hemorrhage, negative anisocoria, negative icterus ENT: Negative Runny nose, negative gingival bleeding, Neck:  Negative scars, masses, torticollis, lymphadenopathy, JVD Lungs: Clear to auscultation bilaterally without wheezes or crackles Cardiovascular: Regular rate and rhythm without murmur gallop or rub normal S1 and S2 Abdomen: negative abdominal pain, nondistended, positive soft, bowel sounds, no rebound, no ascites, no appreciable mass Extremities: No significant cyanosis, clubbing, or edema bilateral lower extremities Skin: Negative rashes, lesions, ulcers Psychiatric:  Negative depression, negative anxiety, negative fatigue, negative mania  Central nervous system:  Cranial nerves II through XII intact, tongue/uvula midline, all extremities muscle strength 5/5, sensation intact throughout, finger nose finger bilateral within normal limits, quick finger touch bilateral  within normal limits, unstable on his feet, 1-2 assist when ambulating.  Negative dysarthria, negative expressive aphasia, negative receptive aphasia.     Data Reviewed: Care during the described time interval was provided by me .  I have reviewed this patient's available data, including medical history, events of note, physical examination, and all test results as part of my evaluation.   CBC: Recent Labs  Lab 08/21/19 0311 08/23/19 1003 08/25/19 0218 08/26/19 0208 08/27/19 0502  WBC 20.4* 11.2* 9.2 9.6 12.1*  NEUTROABS  --   --   --   --  10.4*  HGB 16.2 14.7 13.0 13.4 12.4*  HCT 53.3* 46.5 41.0 42.7 39.6  MCV 84.9 81.4 81.8 81.2 81.8  PLT 278 281 304 345 786*   Basic Metabolic Panel: Recent Labs  Lab 08/21/19 0311 08/21/19 0311 08/23/19 1003  08/24/19 0438 08/25/19 0218 08/26/19 0208 08/27/19 0502  NA 149*   < > 151* 147* 140 138 140  K 4.4   < > 3.6 3.8 4.1 4.1 4.0  CL 118*   < > 116* 115* 108 107 106  CO2 17*   < > 25 24 22  18* 23  GLUCOSE 106*   < > 114* 123* 150* 197* 142*  BUN 61*   < > 32* 26* 24* 30* 31*  CREATININE 5.93*   < > 2.46* 1.84* 1.50* 1.52* 1.40*  CALCIUM 7.7*   < > 8.2* 7.9* 7.6* 7.6* 7.8*  MG 2.1  --   --   --   --   --  1.8  PHOS 3.2  --   --   --   --   --  2.8   < > = values in this interval not displayed.   GFR: Estimated Creatinine Clearance: 40.7 mL/min (A) (by C-G formula based on SCr of 1.4 mg/dL (H)). Liver Function Tests: Recent Labs  Lab 08/21/19 0311 08/23/19 1003 08/25/19 0218 08/26/19 0208 08/27/19 0502  AST 89* 76* 57* 52* 51*  ALT 45* 40 40 41 44  ALKPHOS 57 58 54 63 51  BILITOT 0.8 0.5 0.8 0.3 0.4  PROT 7.3 6.8 6.2* 6.3* 6.1*  ALBUMIN 2.6* 2.3* 2.1* 2.0* 2.1*   No results for input(s): LIPASE, AMYLASE in the last 168 hours. No results for input(s): AMMONIA in the last 168 hours. Coagulation Profile: No results for input(s): INR, PROTIME in the last 168 hours. Cardiac Enzymes: No results for input(s): CKTOTAL, CKMB, CKMBINDEX, TROPONINI in the last 168 hours. BNP (last 3 results) No results for input(s): PROBNP in the last 8760 hours. HbA1C: Recent Labs    08/26/19 0208  HGBA1C 6.4*   CBG: Recent Labs  Lab 08/26/19 1618 08/26/19 1952 08/27/19 0025 08/27/19 0354 08/27/19 0714  GLUCAP 189* 202* 184* 150* 138*   Lipid Profile: No results for input(s): CHOL, HDL, LDLCALC, TRIG, CHOLHDL, LDLDIRECT in the last 72 hours. Thyroid Function Tests: No results for input(s): TSH, T4TOTAL, FREET4, T3FREE, THYROIDAB in the last 72 hours. Anemia Panel: Recent Labs    08/27/19 0502  FERRITIN 386*   Urine analysis:    Component Value Date/Time   COLORURINE AMBER (A) 09/18/2018 2355   APPEARANCEUR HAZY (A) 09/18/2018 2355   LABSPEC 1.023 09/18/2018 2355   PHURINE  5.0 09/18/2018 2355   GLUCOSEU NEGATIVE 09/18/2018 2355   HGBUR NEGATIVE 09/18/2018 2355   BILIRUBINUR MODERATE (A) 09/18/2018 2355   KETONESUR NEGATIVE 09/18/2018 2355   PROTEINUR 30 (A) 09/18/2018 2355   UROBILINOGEN 0.2 02/02/2014 1804  NITRITE NEGATIVE 09/18/2018 2355   LEUKOCYTESUR NEGATIVE 09/18/2018 2355   Sepsis Labs: @LABRCNTIP (procalcitonin:4,lacticidven:4)  ) Recent Results (from the past 240 hour(s))  Culture, blood (routine x 2)     Status: None   Collection Time: 08/22/19  8:08 AM   Specimen: BLOOD  Result Value Ref Range Status   Specimen Description   Final    BLOOD RIGHT ARM Performed at [facility], [address], [place], [place]    Special Requests   Final    BOTTLES DRAWN AEROBIC ONLY Blood Culture adequate volume Performed at [facility], [address], [place], [place]    Culture   Final    NO GROWTH 5 DAYS Performed at [facility], [address], [place], [place]    Report Status 08/27/2019 FINAL  Final  Culture, blood (routine x 2)     Status: None   Collection Time: 08/22/19  8:12 AM   Specimen: BLOOD RIGHT HAND  Result Value Ref Range Status   Specimen Description   Final    BLOOD RIGHT HAND Performed at [facility], [address], [place], [place]    Special Requests   Final    BOTTLES DRAWN AEROBIC ONLY Blood Culture results may not be optimal due to an inadequate volume of blood received in culture bottles Performed at [facility], [address] [place], [place]    Culture   Final    NO GROWTH 5 DAYS Performed at [facility], [address] [place], [place]    Report Status 08/27/2019 FINAL  Final         Radiology Studies: No results found.      Scheduled Meds: . amLODipine  5 mg Oral BID  . Chlorhexidine Gluconate Cloth  6 each Topical Daily  . dexamethasone  (DECADRON) injection  6 mg Intravenous Q24H  . heparin  5,000 Units Subcutaneous Q8H  . insulin aspart  0-9 Units Subcutaneous Q4H  . QUEtiapine  50 mg Oral QHS   Continuous Infusions: . dextrose 150 mL/hr at 08/27/19 0926  . remdesivir 100 mg in NS 100 mL Stopped (08/27/19 0955)     LOS: 8 days   The patient is critically ill with multiple organ systems failure and requires high complexity decision making for assessment and support, frequent evaluation and titration of therapies, application of advanced monitoring technologies and extensive interpretation of multiple databases. Critical Care Time devoted to patient care services described in this note  Time spent: 40 minutes     Melvine Julin, 08/29/19, MD [facility] Pager [phone]  If 7PM-7AM, please contact night-coverage [email or web address] Password [facility] 08/27/2019, 12:24 PM

## 2019-08-28 LAB — CBC WITH DIFFERENTIAL/PLATELET
Abs Immature Granulocytes: 0.33 10*3/uL — ABNORMAL HIGH (ref 0.00–0.07)
Basophils Absolute: 0.1 10*3/uL (ref 0.0–0.1)
Basophils Relative: 0 %
Eosinophils Absolute: 0 10*3/uL (ref 0.0–0.5)
Eosinophils Relative: 0 %
HCT: 43.3 % (ref 39.0–52.0)
Hemoglobin: 13.8 g/dL (ref 13.0–17.0)
Immature Granulocytes: 3 %
Lymphocytes Relative: 9 %
Lymphs Abs: 1.1 10*3/uL (ref 0.7–4.0)
MCH: 26.2 pg (ref 26.0–34.0)
MCHC: 31.9 g/dL (ref 30.0–36.0)
MCV: 82.3 fL (ref 80.0–100.0)
Monocytes Absolute: 0.9 10*3/uL (ref 0.1–1.0)
Monocytes Relative: 7 %
Neutro Abs: 10.4 10*3/uL — ABNORMAL HIGH (ref 1.7–7.7)
Neutrophils Relative %: 81 %
Platelets: 456 10*3/uL — ABNORMAL HIGH (ref 150–400)
RBC: 5.26 MIL/uL (ref 4.22–5.81)
RDW: 15 % (ref 11.5–15.5)
WBC: 12.7 10*3/uL — ABNORMAL HIGH (ref 4.0–10.5)
nRBC: 0 % (ref 0.0–0.2)

## 2019-08-28 LAB — FERRITIN: Ferritin: 337 ng/mL — ABNORMAL HIGH (ref 24–336)

## 2019-08-28 LAB — COMPREHENSIVE METABOLIC PANEL
ALT: 45 U/L — ABNORMAL HIGH (ref 0–44)
AST: 44 U/L — ABNORMAL HIGH (ref 15–41)
Albumin: 2.3 g/dL — ABNORMAL LOW (ref 3.5–5.0)
Alkaline Phosphatase: 56 U/L (ref 38–126)
Anion gap: 10 (ref 5–15)
BUN: 29 mg/dL — ABNORMAL HIGH (ref 8–23)
CO2: 25 mmol/L (ref 22–32)
Calcium: 8.2 mg/dL — ABNORMAL LOW (ref 8.9–10.3)
Chloride: 104 mmol/L (ref 98–111)
Creatinine, Ser: 1.31 mg/dL — ABNORMAL HIGH (ref 0.61–1.24)
GFR calc Af Amer: 59 mL/min — ABNORMAL LOW (ref >60)
GFR calc non Af Amer: 51 mL/min — ABNORMAL LOW (ref >60)
Glucose, Bld: 113 mg/dL — ABNORMAL HIGH (ref 70–99)
Potassium: 4.1 mmol/L (ref 3.5–5.1)
Sodium: 139 mmol/L (ref 135–145)
Total Bilirubin: 0.3 mg/dL (ref 0.3–1.2)
Total Protein: 6.5 g/dL (ref 6.5–8.1)

## 2019-08-28 LAB — GLUCOSE, CAPILLARY
Glucose-Capillary: 112 mg/dL — ABNORMAL HIGH (ref 70–99)
Glucose-Capillary: 134 mg/dL — ABNORMAL HIGH (ref 70–99)
Glucose-Capillary: 135 mg/dL — ABNORMAL HIGH (ref 70–99)
Glucose-Capillary: 244 mg/dL — ABNORMAL HIGH (ref 70–99)
Glucose-Capillary: 271 mg/dL — ABNORMAL HIGH (ref 70–99)
Glucose-Capillary: 98 mg/dL (ref 70–99)

## 2019-08-28 LAB — D-DIMER, QUANTITATIVE: D-Dimer, Quant: 7.28 ug/mL-FEU — ABNORMAL HIGH (ref 0.00–0.50)

## 2019-08-28 LAB — MAGNESIUM: Magnesium: 2.2 mg/dL (ref 1.7–2.4)

## 2019-08-28 LAB — PHOSPHORUS: Phosphorus: 2.9 mg/dL (ref 2.5–4.6)

## 2019-08-28 LAB — C-REACTIVE PROTEIN: CRP: 2.8 mg/dL — ABNORMAL HIGH (ref <1.0)

## 2019-08-28 NOTE — Plan of Care (Signed)
  Problem: Clinical Measurements: Goal: Ability to maintain clinical measurements within normal limits will improve Outcome: Progressing Goal: Will remain free from infection Outcome: Progressing   Problem: Nutrition: Goal: Adequate nutrition will be maintained Outcome: Progressing   Problem: Pain Managment: Goal: General experience of comfort will improve Outcome: Progressing   Problem: Skin Integrity: Goal: Risk for impaired skin integrity will decrease Outcome: Progressing   Problem: Health Behavior/Discharge Planning: Goal: Ability to manage health-related needs will improve Outcome: Not Progressing   Problem: Activity: Goal: Risk for activity intolerance will decrease Outcome: Not Progressing   Problem: Coping: Goal: Level of anxiety will decrease Outcome: Not Progressing

## 2019-08-28 NOTE — Progress Notes (Signed)
Pt grimacing and moaning out with nursing care, IV placed into right forearm, patent, good blood return, Morphine given, IVF restarted, am labs drawn concurrently.  Will monitor effectiveness

## 2019-08-28 NOTE — Progress Notes (Signed)
Shift summary - Pt resting in low bed, matts on floor, fall risk sign and wrist band present. Pt at room air high 90's sat. BP wnl, pt has occassional cough, non productive. Lungs dim and clear.  Appetite good, requires feeding from staff. Foley w/good urine output, BM this past evening. CBG continue q 4Hr. IVF of D5%@150 /Hr continues. Morphine for pain, noted by facial grimacing, tense look and agitation/squinting of eyes, works well for pt. Report to next shift at bedside/door, pt unremarkable and stable at time of this RNs departure from floor.

## 2019-08-28 NOTE — Progress Notes (Signed)
PROGRESS NOTE    Donald [name]  PJK:[id number] DOB: November 24, 1940 DOA: 08/19/2019 PCP: Pearson Grippe, MD   Brief Narrative:  79 y.o.BM PMHx dementia, hypertension, recent COVID-19 infection for which patient was admitted and discharged 08/09/2019 - 08/12/2019 at which point he had syncope acute kidney injury as well as shortness of breath.Patient was discharged to follow-up with PCP. He has apparently been weak and debilitated since discharge.   Brought back today by wife due to worsening symptoms especially weakness. Patient is unable to give adequate history. He was found to be tachypneic but no evidence of pneumonia. Patient noted to have acute kidney injury. He has had decreased oral intake since discharge. Appears to be dehydrated. Patient is therefore being admitted with early sepsis especially with fever most likely post Covid syndrome with early pneumonia or UTI.   Subjective: 1/23 Sleepy, will wake up and follow commands.   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 -Actemra held secondary to possible concurrent bacterial pneumonia -On previous admission completed course of ceftriaxone+ vancomycin see brief narrative  Bacterial pneumonia -Procalcitonin not consistent with bacterial pneumonia.   Acute metabolic encephalopathy/chronic dementia (POA) -Per EMR Baseline per wife is essentially nonverbal but patient does ambulate on his own, feeds himself most days but does not cook, clean, or perform any ADLs as far as bathing, clothing, hygiene, etc. -Per EMR patient's wife indicates he is very "stubborn" and can get combative if he is prompted or provoked to do something he does not want to do such as ambulate or eat. -Patient most likely at baseline  Dementia, Alzheimer type without behavioral disturbance - Continue with conservative management - High risk for delirium/sundowning  Acute kidney injury on chronic renal failure (baseline Cr 6 months ago 1.69) Recent Labs  Lab 08/24/19 0438 08/25/19 0218 08/26/19 0208 08/27/19 0502 08/28/19 0508  CREATININE 1.84* 1.50* 1.52* 1.40* 1.31*  -Nephrology following -Patient currently able to void without Foley.  Essential hypertension.  Currently normotensive - Will continue with amlodipine 5 mg BID  Goals of care -1/20 PT/OT consult placed; do not believe patient can currently care for himself at home even with the help of his wife.  Has been readmitted within 30 days.  Per EMR Patient lives at home with wife who performs all ADLs and care.  Patient was at adult daycare until the recent outbreak of Covid at their facility. Tentative disposition at this time is likely back home with home health/hospital at home with wife although if patient does not improve drastically SNF may be reasonable pending clinical course and wife's ability to care for the patient appropriately (she is concerned about not seeing him or him becoming altered/confused/combative at new facility). -1/21 PT recommends SNF, consult placed to LCSW for placement -1/22 spoke with LCSW Shanita who is working on placement   DVT prophylaxis:  Subcu heparin Code Status: DNR Family Communication: 1/21  [name] (wife) counseled on plan of care and answered all questions  Disposition Plan:   Consultants:  Nephrology   Procedures/Significant Events:     I have personally reviewed and interpreted all radiology studies and my findings are as above.  VENTILATOR SETTINGS: Room air  1/23 SPO2 100%   Cultures   Antimicrobials: Anti-infectives (From admission, onward)   Start     Dose/Rate Stop   08/25/19 1000  remdesivir 100 mg in sodium chloride 0.9 % 100 mL IVPB     100 mg 200 mL/hr over 30 Minutes 08/29/19 0959   08/24/19 0945  remdesivir 200 mg in sodium chloride 0.9% 250 mL IVPB     200 mg 580 mL/hr over 30 Minutes 08/24/19 1748   08/24/19 0930  vancomycin (VANCOREADY) IVPB 1250 mg/250 mL  Status:  Discontinued     1,250 mg 166.7 mL/hr over 90 Minutes 08/24/19 0841   08/22/19 2100  cefTRIAXone (ROCEPHIN) 2 g in sodium chloride 0.9 % 100 mL IVPB  Status:  Discontinued     2 g 200 mL/hr over 30 Minutes 08/24/19 0745   08/22/19 0800  vancomycin (VANCOREADY) IVPB 1500 mg/300 mL     1,500 mg 150 mL/hr over 120 Minutes 08/22/19 1049   08/22/19 0745  vancomycin variable dose per unstable renal function (pharmacist dosing)  Status:  Discontinued      08/23/19 1358   08/19/19 2100  cefTRIAXone (ROCEPHIN) 1 g in sodium chloride 0.9 % 100 mL IVPB  Status:  Discontinued     1 g 200 mL/hr over 30 Minutes 08/22/19 0745       Devices    LINES / TUBES:      Continuous Infusions: . dextrose 150 mL/hr at 08/28/19 1155     Objective: Vitals:   08/28/19 0308 08/28/19 0500 08/28/19 0516 08/28/19 0738  BP: 134/77  (!) 127/105 116/78  Pulse: 74  91 74  Resp: 18  18 18   Temp: 98 F (36.7 C)  98.4 F (36.9 C) 98.2 F (36.8 C)  TempSrc: Axillary  Axillary Axillary  SpO2:   98% 100%  Weight:  73.5 kg    Height:        Intake/Output Summary (Last 24 hours) at 08/28/2019 1240 Last data filed at 08/28/2019 0600 Gross per 24 hour  Intake 360 ml  Output 3400 ml  Net -3040 ml   Filed Weights   08/25/19 0500 08/27/19 0405 08/28/19 0500   Weight: 76.2 kg 73.5 kg 73.5 kg   Physical Exam:  General: Sleepy, arousable follows commands, no acute respiratory distress Eyes: negative scleral hemorrhage, negative anisocoria, negative icterus ENT: Negative Runny nose, negative gingival bleeding, Neck:  Negative scars, masses, torticollis, lymphadenopathy, JVD Lungs: Clear to auscultation bilaterally without wheezes or crackles Cardiovascular: Regular rate and rhythm without murmur gallop or rub normal S1 and S2 Abdomen: negative abdominal pain, nondistended, positive soft, bowel sounds, no rebound, no ascites, no appreciable mass Extremities: No significant cyanosis, clubbing, or edema bilateral lower extremities Skin: Negative rashes, lesions, ulcers Psychiatric: Unable to assess secondary to altered mental status Central nervous system:  Cranial nerves II through XII intact, tongue/uvula midline, moves all extremities spontaneously, follows some commands.       Data Reviewed: Care during the described time interval was provided by me .  I have reviewed this patient's available data, including medical history, events of note, physical examination, and all test results as part of my evaluation.  CBC: Recent Labs  Lab 08/23/19 1003 08/25/19 0218 08/26/19 0208 08/27/19 0502 08/28/19 0508  WBC 11.2* 9.2 9.6 12.1* 12.7*  NEUTROABS  --   --   --  10.4* 10.4*  HGB 14.7 13.0 13.4 12.4* 13.8  HCT 46.5 41.0 42.7 39.6 43.3  MCV 81.4 81.8 81.2 81.8 82.3  PLT 281 304 345 420* 456*   Basic Metabolic Panel: Recent Labs  Lab 08/24/19 0438 08/25/19 0218 08/26/19 0208 08/27/19 0502 08/28/19 0508  NA 147* 140 138 140 139  K 3.8 4.1 4.1 4.0 4.1  CL 115* 108 107 106 104  CO2 24 22 18* 23 25  GLUCOSE 123* 150* 197* 142* 113*  BUN 26* 24* 30* 31* 29*  CREATININE 1.84* 1.50* 1.52* 1.40* 1.31*  CALCIUM 7.9* 7.6* 7.6* 7.8* 8.2*  MG  --   --   --  1.8 2.2  PHOS  --   --   --  2.8 2.9   GFR: Estimated Creatinine Clearance: 43.5  mL/min (A) (by C-G formula based on SCr of 1.31 mg/dL (H)). Liver Function Tests: Recent Labs  Lab 08/23/19 1003 08/25/19 0218 08/26/19 0208 08/27/19 0502 08/28/19 0508  AST 76* 57* 52* 51* 44*  ALT 40 40 41 44 45*  ALKPHOS 58 54 63 51 56  BILITOT 0.5 0.8 0.3 0.4 0.3  PROT 6.8 6.2* 6.3* 6.1* 6.5  ALBUMIN 2.3* 2.1* 2.0* 2.1* 2.3*   No results for input(s): LIPASE, AMYLASE in the last 168 hours. No results for input(s): AMMONIA in the last 168 hours. Coagulation Profile: No results for input(s): INR, PROTIME in the last 168 hours. Cardiac Enzymes: No results for input(s): CKTOTAL, CKMB, CKMBINDEX, TROPONINI in the last 168 hours. BNP (last 3 results) No results for input(s): PROBNP in the last 8760 hours. HbA1C: Recent Labs    08/26/19 0208  HGBA1C 6.4*   CBG: Recent Labs  Lab 08/27/19 1941 08/28/19 0042 08/28/19 0518 08/28/19 0747 08/28/19 1159  GLUCAP 203* 135* 98 112* 134*   Lipid Profile: No results for input(s): CHOL, HDL, LDLCALC, TRIG, CHOLHDL, LDLDIRECT in the last 72 hours. Thyroid Function Tests: No results for input(s): TSH, T4TOTAL, FREET4, T3FREE, THYROIDAB in the last 72 hours. Anemia Panel: Recent Labs    08/27/19 0502 08/28/19 0508  FERRITIN 386* 337*   Urine analysis:    Component Value Date/Time   COLORURINE AMBER (A) 09/18/2018 2355   APPEARANCEUR HAZY (A) 09/18/2018 2355   LABSPEC 1.023 09/18/2018 2355   PHURINE 5.0 09/18/2018 2355   GLUCOSEU NEGATIVE 09/18/2018 2355   HGBUR NEGATIVE 09/18/2018 2355   BILIRUBINUR MODERATE (A) 09/18/2018 2355   KETONESUR NEGATIVE 09/18/2018 2355   PROTEINUR 30 (A) 09/18/2018 2355   UROBILINOGEN 0.2 02/02/2014 1804   NITRITE NEGATIVE 09/18/2018 2355   LEUKOCYTESUR NEGATIVE 09/18/2018 2355   Sepsis Labs: @LABRCNTIP (procalcitonin:4,lacticidven:4)  ) Recent Results (from the past 240 hour(s))  Culture, blood (routine x 2)     Status: None   Collection Time: 08/22/19  8:08 AM   Specimen: BLOOD   Result Value Ref Range Status   Specimen Description   Final    BLOOD RIGHT ARM Performed at [facility], [address], [place], [place]    Special Requests   Final    BOTTLES DRAWN AEROBIC ONLY Blood Culture adequate volume Performed at [facility], [address], [place], [place]    Culture   Final    NO GROWTH 5 DAYS Performed at [facility]  [facility], [place] [address] [place], [place] [address]    Report Status 08/27/2019 FINAL  Final  Culture, blood (routine x 2)     Status: None   Collection Time: 08/22/19  8:12 AM   Specimen: BLOOD RIGHT HAND  Result Value Ref Range Status   Specimen Description   Final    BLOOD RIGHT HAND Performed at [facility] [address], [place], [place] [address]    Special Requests   Final    BOTTLES DRAWN AEROBIC ONLY Blood Culture results may not be optimal due to an inadequate volume of blood received in culture bottles Performed at [facility] [address] [place], [place] [address]    Culture   Final    NO GROWTH 5 DAYS Performed at [facility], [place] [address], [place], [place] [address]    Report Status 08/27/2019 FINAL  Final         Radiology Studies: No results found.      Scheduled Meds: . amLODipine  5 mg Oral BID  . Chlorhexidine Gluconate Cloth  6 each Topical Daily  . dexamethasone (DECADRON) injection  6 mg Intravenous Q24H  . heparin  5,000 Units Subcutaneous Q8H  . insulin aspart  0-9 Units Subcutaneous Q4H  . QUEtiapine  50 mg Oral QHS   Continuous Infusions: . dextrose 150 mL/hr at 08/28/19 1155     LOS: 9 days   The patient is critically ill with multiple organ systems failure and requires high complexity decision making for assessment and support, frequent evaluation and titration of therapies, application of advanced monitoring technologies and extensive interpretation of multiple databases.  Critical Care Time devoted to patient care services described in this note  Time spent: 40 minutes     Floyed Masoud, Geraldo Docker, MD [facility] Pager [phone]  If 7PM-7AM, please contact night-coverage [email or web address] Password [facility] 08/28/2019, 12:40 PM

## 2019-08-29 LAB — CBC WITH DIFFERENTIAL/PLATELET
Abs Immature Granulocytes: 0.22 10*3/uL — ABNORMAL HIGH (ref 0.00–0.07)
Basophils Absolute: 0 10*3/uL (ref 0.0–0.1)
Basophils Relative: 0 %
Eosinophils Absolute: 0 10*3/uL (ref 0.0–0.5)
Eosinophils Relative: 0 %
HCT: 36.5 % — ABNORMAL LOW (ref 39.0–52.0)
Hemoglobin: 11.5 g/dL — ABNORMAL LOW (ref 13.0–17.0)
Immature Granulocytes: 2 %
Lymphocytes Relative: 9 %
Lymphs Abs: 0.9 10*3/uL (ref 0.7–4.0)
MCH: 26.1 pg (ref 26.0–34.0)
MCHC: 31.5 g/dL (ref 30.0–36.0)
MCV: 83 fL (ref 80.0–100.0)
Monocytes Absolute: 0.7 10*3/uL (ref 0.1–1.0)
Monocytes Relative: 7 %
Neutro Abs: 8.5 10*3/uL — ABNORMAL HIGH (ref 1.7–7.7)
Neutrophils Relative %: 82 %
Platelets: 294 10*3/uL (ref 150–400)
RBC: 4.4 MIL/uL (ref 4.22–5.81)
RDW: 15.3 % (ref 11.5–15.5)
WBC: 10.4 10*3/uL (ref 4.0–10.5)
nRBC: 0.2 % (ref 0.0–0.2)

## 2019-08-29 LAB — COMPREHENSIVE METABOLIC PANEL
ALT: 39 U/L (ref 0–44)
AST: 41 U/L (ref 15–41)
Albumin: 2.2 g/dL — ABNORMAL LOW (ref 3.5–5.0)
Alkaline Phosphatase: 57 U/L (ref 38–126)
Anion gap: 12 (ref 5–15)
BUN: 28 mg/dL — ABNORMAL HIGH (ref 8–23)
CO2: 20 mmol/L — ABNORMAL LOW (ref 22–32)
Calcium: 8 mg/dL — ABNORMAL LOW (ref 8.9–10.3)
Chloride: 104 mmol/L (ref 98–111)
Creatinine, Ser: 1.36 mg/dL — ABNORMAL HIGH (ref 0.61–1.24)
GFR calc Af Amer: 57 mL/min — ABNORMAL LOW (ref >60)
GFR calc non Af Amer: 49 mL/min — ABNORMAL LOW (ref >60)
Glucose, Bld: 109 mg/dL — ABNORMAL HIGH (ref 70–99)
Potassium: 4.6 mmol/L (ref 3.5–5.1)
Sodium: 136 mmol/L (ref 135–145)
Total Bilirubin: 0.8 mg/dL (ref 0.3–1.2)
Total Protein: 5.8 g/dL — ABNORMAL LOW (ref 6.5–8.1)

## 2019-08-29 LAB — GLUCOSE, CAPILLARY
Glucose-Capillary: 111 mg/dL — ABNORMAL HIGH (ref 70–99)
Glucose-Capillary: 73 mg/dL (ref 70–99)
Glucose-Capillary: 160 mg/dL — ABNORMAL HIGH (ref 70–99)

## 2019-08-29 LAB — C-REACTIVE PROTEIN: CRP: 2.6 mg/dL — ABNORMAL HIGH (ref <1.0)

## 2019-08-29 LAB — MAGNESIUM: Magnesium: 1.8 mg/dL (ref 1.7–2.4)

## 2019-08-29 LAB — FERRITIN: Ferritin: 304 ng/mL (ref 24–336)

## 2019-08-29 LAB — PHOSPHORUS: Phosphorus: 2.7 mg/dL (ref 2.5–4.6)

## 2019-08-29 LAB — D-DIMER, QUANTITATIVE: D-Dimer, Quant: 16.58 ug/mL-FEU — ABNORMAL HIGH (ref 0.00–0.50)

## 2019-08-29 NOTE — Clinical Social Work Note (Signed)
No bed at [facility], [place], or [place] today. TOC will follow up tomorrow 1/25.

## 2019-08-30 DIAGNOSIS — R6520 Severe sepsis without septic shock: Secondary | ICD-10-CM

## 2019-08-30 LAB — FERRITIN: Ferritin: 252 ng/mL (ref 24–336)

## 2019-08-30 LAB — CBC WITH DIFFERENTIAL/PLATELET
Abs Immature Granulocytes: 0.32 10*3/uL — ABNORMAL HIGH (ref 0.00–0.07)
Basophils Absolute: 0 10*3/uL (ref 0.0–0.1)
Basophils Relative: 0 %
Eosinophils Absolute: 0 10*3/uL (ref 0.0–0.5)
Eosinophils Relative: 0 %
HCT: 42.1 % (ref 39.0–52.0)
Hemoglobin: 13.5 g/dL (ref 13.0–17.0)
Immature Granulocytes: 3 %
Lymphocytes Relative: 7 %
Lymphs Abs: 0.8 10*3/uL (ref 0.7–4.0)
MCH: 26.3 pg (ref 26.0–34.0)
MCHC: 32.1 g/dL (ref 30.0–36.0)
MCV: 82.1 fL (ref 80.0–100.0)
Monocytes Absolute: 0.7 10*3/uL (ref 0.1–1.0)
Monocytes Relative: 6 %
Neutro Abs: 10.6 10*3/uL — ABNORMAL HIGH (ref 1.7–7.7)
Neutrophils Relative %: 84 %
Platelets: 468 10*3/uL — ABNORMAL HIGH (ref 150–400)
RBC: 5.13 MIL/uL (ref 4.22–5.81)
RDW: 15.5 % (ref 11.5–15.5)
WBC: 12.5 10*3/uL — ABNORMAL HIGH (ref 4.0–10.5)
nRBC: 0 % (ref 0.0–0.2)

## 2019-08-30 LAB — GLUCOSE, CAPILLARY
Glucose-Capillary: 102 mg/dL — ABNORMAL HIGH (ref 70–99)
Glucose-Capillary: 243 mg/dL — ABNORMAL HIGH (ref 70–99)
Glucose-Capillary: 157 mg/dL — ABNORMAL HIGH (ref 70–99)
Glucose-Capillary: 135 mg/dL — ABNORMAL HIGH (ref 70–99)
Glucose-Capillary: 119 mg/dL — ABNORMAL HIGH (ref 70–99)
Glucose-Capillary: 131 mg/dL — ABNORMAL HIGH (ref 70–99)
Glucose-Capillary: 116 mg/dL — ABNORMAL HIGH (ref 70–99)
Glucose-Capillary: 115 mg/dL — ABNORMAL HIGH (ref 70–99)

## 2019-08-30 LAB — D-DIMER, QUANTITATIVE: D-Dimer, Quant: 3.74 ug/mL-FEU — ABNORMAL HIGH (ref 0.00–0.50)

## 2019-08-30 LAB — PHOSPHORUS: Phosphorus: 3.1 mg/dL (ref 2.5–4.6)

## 2019-08-30 LAB — MAGNESIUM: Magnesium: 1.9 mg/dL (ref 1.7–2.4)

## 2019-08-30 LAB — C-REACTIVE PROTEIN: CRP: 1.6 mg/dL — ABNORMAL HIGH (ref <1.0)

## 2019-08-30 MED ORDER — SORBITOL 70 % SOLN: 30.0000 mL | 0 refills | Status: AC | PRN

## 2019-08-30 MED ORDER — ACETAMINOPHEN 325 MG PO TABS: 650.0000 mg | ORAL_TABLET | Freq: Four times a day (QID) | ORAL | 0 refills | Status: AC | PRN

## 2019-08-30 MED ORDER — INSULIN ASPART 100 UNIT/ML ~~LOC~~ SOLN: 0.0000 [IU] | Freq: Three times a day (TID) | SUBCUTANEOUS | Status: DC

## 2019-08-30 MED ORDER — HYDROXYZINE HCL 25 MG PO TABS: 25.0000 mg | ORAL_TABLET | Freq: Three times a day (TID) | ORAL | 0 refills | Status: DC | PRN

## 2019-08-30 MED ORDER — ALBUTEROL SULFATE HFA 108 (90 BASE) MCG/ACT IN AERS: 2.0000 | INHALATION_SPRAY | RESPIRATORY_TRACT | 0 refills | Status: AC | PRN

## 2019-08-30 MED ORDER — ONDANSETRON HCL 4 MG PO TABS: 4.0000 mg | ORAL_TABLET | Freq: Four times a day (QID) | ORAL | 0 refills | Status: DC | PRN

## 2019-08-30 MED ORDER — QUETIAPINE FUMARATE 50 MG PO TABS: 50.0000 mg | ORAL_TABLET | Freq: Every day | ORAL | 0 refills | Status: AC

## 2019-08-30 MED ORDER — CAMPHOR-MENTHOL 0.5-0.5 % EX LOTN: 1.0000 "application " | TOPICAL_LOTION | Freq: Three times a day (TID) | CUTANEOUS | 0 refills | Status: DC | PRN

## 2019-08-30 MED ORDER — NEPRO/CARBSTEADY PO LIQD: 237.0000 mL | Freq: Three times a day (TID) | ORAL | 0 refills | Status: AC | PRN

## 2019-08-30 MED ORDER — AMLODIPINE BESYLATE 5 MG PO TABS: 5.0000 mg | ORAL_TABLET | Freq: Two times a day (BID) | ORAL | 0 refills | Status: AC

## 2019-08-30 NOTE — Discharge Summary (Addendum)
Physician Discharge Summary  Timoteo AceFrederick D Nixon UJW:[id number] DOB: [birth year] DOA: 08/19/2019  PCP: Pearson GrippeKim, [name], MD  Admit date: 08/19/2019 Discharge date: 08/31/2019  Time spent: 30 minutes  Recommendations for Outpatient Follow-up:   Covid pneumonia/acute respiratory failure with hypoxia (POA)/sepsis COVID-19 Labs  Recent Labs    08/28/19 0508 08/29/19 1145 08/30/19 0547  DDIMER 7.28* 16.58* 3.74*  FERRITIN 337* 304 252  CRP 2.8* 2.6* 1.6*    Lab Results  Component Value Date   SARSCOV2NAA POSITIVE (A) 08/09/2019   -PCXR bilateral patchy infiltrate concerning for acute pneumonia in the setting of Covid 19 -Remdesivir per pharmacy protocol -Decadron 6 mg daily x10 days  -Patient meets criteria for sepsis on admission secondary to Covid pneumonia (fever, tachycardia, tachypnea, leukocytosis). -Actemra held secondary to possible concurrent bacterial pneumonia -On previous admission completed course of ceftriaxone+ vancomycin see brief narrative  Bacterial pneumonia -Procalcitonin not consistent with bacterial pneumonia.  Acute metabolic encephalopathy/chronic dementia (POA) -Per EMR Baseline per wife is essentially nonverbal but patient does ambulate on his own, feeds himself most days but does not cook, clean, or perform any ADLs as far as bathing, clothing, hygiene, etc. -Per EMR patient's wife indicates he is very "stubborn" and can get combative if he is prompted or provoked to do something he does not want to do such as ambulate or eat. 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 Currently normotensive -Will continue with amlodipine 5 mg BID  Goals of care -1/20 PT/OT consult placed; do not believe patient can currently care for himself at home even with the help of his wife.  Has been readmitted within 30 days.  Per EMR Patient lives at home with wife who performs all ADLs and care.    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 He has apparently been weak and debilitated since discharge.   Brought back today by wife due to worsening symptoms especially weakness. Patient is unable to give adequate history. He was found to be tachypneic but no evidence of pneumonia. Patient noted to have acute kidney injury. He has had decreased oral intake since discharge. Appears to be dehydrated. Patient is therefore being admitted with early sepsis especially with fever most likely post Covid syndrome with early pneumonia or UTI.  Hospital Course:  During his hospitalization patient was treated for Covid pneumonia/acute respiratory failure with hypoxia/sepsis.  Responded well to Covid protocol.  Unfortunately at completion of treatment apparent that patient at baseline patient no longer able to care for himself nor would wife given her age and condition be able to provide appropriate care.  Stable for discharge    Consultations: Nephrology   Cultures   1/4  SARS coronavirus positive  Antibiotics Anti-infectives (From admission, onward)   Start     Dose/Rate Stop   08/25/19 1000  remdesivir 100 mg in sodium chloride 0.9 % 100 mL IVPB     100 mg 200 mL/hr over 30 Minutes 08/28/19 1230   08/24/19 0945  remdesivir 200 mg in sodium chloride 0.9% 250 mL IVPB     200 mg 580 mL/hr over 30 Minutes 08/24/19 1748   08/24/19 0930  vancomycin (VANCOREADY) IVPB 1250 mg/250 mL  Status:  Discontinued     1,250 mg 166.7 mL/hr over 90 Minutes 08/24/19 0841   08/22/19 2100  cefTRIAXone (ROCEPHIN) 2 g in sodium chloride 0.9 % 100 mL IVPB  Status:  Discontinued     2 g 200 mL/hr over 30 Minutes 08/24/19 0745   08/22/19 0800  vancomycin (VANCOREADY) IVPB 1500 mg/300 mL     1,500 mg 150 mL/hr over 120 Minutes 08/22/19 1049   08/22/19 0745  vancomycin variable dose per unstable renal function (pharmacist dosing)  Status:  Discontinued      08/23/19 1358   08/19/19 2100  cefTRIAXone (ROCEPHIN) 1 g in sodium chloride 0.9 % 100 mL IVPB  Status:  Discontinued     1 g 200 mL/hr over 30 Minutes 08/22/19 0745       Discharge Exam: Vitals:   08/29/19 1647 08/29/19 2241 08/30/19 0500 08/30/19 0747  BP: (!) 141/71 (!) 152/96  119/78  Pulse: 87 89  81  Resp:  18  19  Temp: 98.6 F (37 C) 98.4 F (36.9 C)  98.1 F (36.7 C)  TempSrc: Axillary Axillary  Axillary  SpO2: 97% 96%  95%  Weight:   71.7 kg   Height:        General: Awake, tracks you with his eyes around room, occasionally answers yes and no to simple questions, no acute respiratory distress Eyes: negative scleral hemorrhage, negative anisocoria, negative icterus ENT: Negative Runny nose, negative gingival bleeding, Neck:  Negative scars, masses, torticollis, lymphadenopathy, JVD Lungs: Clear to auscultation bilaterally without wheezes or crackles Cardiovascular: Regular rate and rhythm without murmur gallop or rub normal S1 and S2   Discharge Instructions   Allergies as of 08/30/2019   No  Known Allergies     Medication List    STOP taking these medications   azithromycin 250 MG tablet Commonly known as: ZITHROMAX   dexamethasone 2 MG tablet Commonly known as: DECADRON   lisinopril 40 MG tablet Commonly known as: ZESTRIL     TAKE these medications   acetaminophen 325 MG tablet Commonly known as: TYLENOL Take 2 tablets (650 mg total) by mouth every 6 (six) hours as needed for mild pain (or Fever >/= 101).   albuterol 108 (90 Base) MCG/ACT inhaler Commonly known as: VENTOLIN HFA Inhale 2 puffs into the lungs every 4 (four) hours as needed for wheezing or shortness of breath.   amLODipine 5 MG tablet Commonly known as: NORVASC Take 1 tablet (5 mg total) by mouth 2 (two) times daily. What changed:   medication strength  how much to take  when to take this   camphor-menthol lotion Commonly known as: SARNA Apply 1 application topically every 8 (eight) hours as needed for itching. Apply to affected area   feeding supplement (NEPRO CARB STEADY) Liqd Take 237 mLs by mouth 3 (three) times daily as needed (Supplement). What changed:   when to  take this  reasons to take this   feeding supplement (PRO-STAT SUGAR FREE 64) Liqd Take 30 mLs by mouth 2 (two) times daily.   hydrOXYzine 25 MG tablet Commonly known as: ATARAX/VISTARIL Take 1 tablet (25 mg total) by mouth every 8 (eight) hours as needed for itching.   ondansetron 4 MG tablet Commonly known as: ZOFRAN Take 1 tablet (4 mg total) by mouth every 6 (six) hours as needed for nausea.   pantoprazole 40 MG tablet Commonly known as: PROTONIX Take 40 mg by mouth daily.   QUEtiapine 50 MG tablet Commonly known as: SEROQUEL Take 1 tablet (50 mg total) by mouth at bedtime.   sorbitol 70 % Soln Take 30 mLs by mouth as needed for moderate constipation.      No Known Allergies    The results of significant diagnostics from this hospitalization (including imaging, microbiology, ancillary and  laboratory) are listed below for reference.    Significant Diagnostic Studies: US RENAL  Result Date: 08/22/2019 CLINICAL DATA:  Hypertension, dementia, COVID positive EXAM: RENAL / URINARY TRACT ULTRASOUND COMPLETE COMPARISON:  CT abdomen 10/02/2018 FINDINGS: Right Kidney: Renal measurements: 11.2 x 5.9 x 6.0 cm = volume: 206 mL. Mild hydronephrosis. Two prominent anechoic cysts measuring 4 cm and 2.5 cm. No complexity Left Kidney: Renal measurements: 11.6 x 4.7 x 5.7 cm = volume: 163 mL. Mild hydronephrosis. Large anechoic cyst measuring 4 cm.  No complexity. Bladder: Bladder collapsed around Foley catheter. Other: None. IMPRESSION: 1. Mild bilateral hydronephrosis. 2. Bilateral benign appearing renal cysts. 3. Foley catheter within bladder. Electronically Signed   By: Genevive Bi M.D.   On: 08/22/2019 10:52   DG Chest Port 1 View  Addendum Date: 08/22/2019   ADDENDUM REPORT: 08/22/2019 10:49 ADDENDUM: Correction: Impression 2. Subtle bilateral airspace disease would be consistent with viral pneumonia Electronically Signed   By: Genevive Bi M.D.   On: 08/22/2019 10:49   Result Date: 08/22/2019 CLINICAL DATA:  Difficulty breathing COVID positive EXAM: PORTABLE CHEST 1 VIEW COMPARISON:  08/21/2019 positive COVID FINDINGS: A stable mediastinum and cardiac silhouette. There is subtle bilateral airspace densities. Low lung volumes. No pneumothorax IMPRESSION: 1. No interval change. 2. Subtle bilateral airspace densities could would be consistent with viral pneumonia. Electronically Signed: By: Genevive Bi M.D. On: 08/22/2019 08:39   DG CHEST PORT 1 VIEW  Result Date: 08/21/2019 CLINICAL DATA:  Shortness of breath and COVID-19 positivity EXAM: PORTABLE CHEST 1 VIEW COMPARISON:  08/19/2019 FINDINGS: Cardiac shadow is stable. Lungs are well aerated bilaterally. Slight increase in patchy opacities are noted bilaterally consistent with the given clinical history. No sizable effusion is noted.  No bony abnormality is seen. IMPRESSION: Increased parenchymal opacities consistent with the given clinical history. Electronically Signed   By: Alcide Clever M.D.   On: 08/21/2019 01:38   DG Chest Port 1 View  Result Date: 08/19/2019 CLINICAL DATA:  Pt has hx of dementia. Pt's wife stated that after d/c, pt has gotten weaker. Wife believes it is a medication reaction. Pt is taking decadron and z pack. Pt recently d/c behavioral medication during stay at [facility]. EXAM: PORTABLE CHEST 1 VIEW COMPARISON:  08/09/2019 FINDINGS: Cardiac silhouette normal in size.  No mediastinal or hilar masses. Mild linear opacities are noted at the lung bases most likely atelectasis. These findings are similar to prior exam again accentuated by low lung volumes. Remainder of the lungs is clear. No pleural effusion or pneumothorax. Skeletal structures are grossly intact. IMPRESSION: No acute cardiopulmonary disease.  Electronically Signed   By: Amie Portland M.D.   On: 08/19/2019 16:21   DG Chest Port 1 View  Result Date: 08/09/2019 CLINICAL DATA:  Syncope. EXAM: PORTABLE CHEST 1 VIEW COMPARISON:  05/13/2017. FINDINGS: Trachea is midline. Heart is enlarged, as before. Lungs are low in volume with minimal left basilar subsegmental atelectasis. No airspace consolidation or pleural fluid. Visualized upper abdomen is unremarkable. IMPRESSION: Low lung volumes.  No acute findings. Electronically Signed   By: Leanna Battles M.D.   On: 08/09/2019 13:35   ECHOCARDIOGRAM COMPLETE  Result Date: 08/10/2019   ECHOCARDIOGRAM REPORT   [name] Bonham Date of Exam: 08/10/2019 Medical Rec #:  [id number]                  Height:       68.0 in Accession #:    [id number]                 Weight:       160.0 lb Date of Birth:  08-13-1940                  BSA:          1.86 m Patient Age:    78 years                   BP:           117/73 mmHg Patient Gender: M                          HR:           60 bpm. Exam Location:   Inpatient Procedure: 2D Echo, Color Doppler and Cardiac Doppler                                MODIFIED REPORT: This report was modified by Weston Brass MD on 08/10/2019 due to syngo error.  Indications:     R55 Syncope  History:         Patient has no prior history of Echocardiogram examinations.                  Risk Factors:Hypertension. COVID+ on 08/09/19.  Sonographer:     Irving Burton Senior RDCS Referring Phys:  Herminio Heads Diagnosing Phys: Weston Brass MD  Sonographer Comments: No subcostal window. Technically difficult study due to altered mental status. IMPRESSIONS  1. Left ventricular ejection fraction, by visual estimation, is 60 to 65%. The left ventricle has normal function. Left ventricular septal wall thickness was mildly increased. There is no left ventricular hypertrophy.  2. Left ventricular diastolic parameters are consistent with Grade II diastolic dysfunction (pseudonormalization).  3. Global right ventricle has mildly reduced systolic function.The right ventricular size is normal. No increase in right ventricular wall thickness.  4. Left atrial size was normal.  5. Right atrial size was normal.  6. The mitral valve is normal in structure. Mild to moderate mitral valve regurgitation. No evidence of mitral stenosis.  7. The tricuspid valve is normal in structure.  8. The aortic valve is normal in structure. Aortic valve regurgitation is mild. No evidence of aortic valve sclerosis or stenosis.  9. The pulmonic valve was grossly normal. Pulmonic valve regurgitation is trivial. 10. TR signal is inadequate for assessing pulmonary artery systolic pressure. FINDINGS  Left Ventricle: Left ventricular ejection fraction, by  visual estimation, is 60 to 65%. The left ventricle has normal function. The left ventricle is not well visualized. There is no left ventricular hypertrophy. Left ventricular diastolic parameters are consistent with Grade II diastolic dysfunction (pseudonormalization). Indeterminate  filling pressures. Right Ventricle: The right ventricular size is normal. No increase in right ventricular wall thickness. Global RV systolic function is has mildly reduced systolic function. Left Atrium: Left atrial size was normal in size. Right Atrium: Right atrial size was normal in size Pericardium: There is no evidence of pericardial effusion. Mitral Valve: The mitral valve is normal in structure. Mild to moderate mitral valve regurgitation. No evidence of mitral valve stenosis by observation. Tricuspid Valve: The tricuspid valve is normal in structure. Tricuspid valve regurgitation is trivial. Aortic Valve: The aortic valve is normal in structure. Aortic valve regurgitation is mild. The aortic valve is structurally normal, with no evidence of sclerosis or stenosis. Pulmonic Valve: The pulmonic valve was grossly normal. Pulmonic valve regurgitation is trivial. Pulmonic regurgitation is trivial. Aorta: The ascending aorta was not well visualized and the aortic root is normal in size and structure. Venous: The inferior vena cava was not well visualized. IAS/Shunts: The interatrial septum was not well visualized.  LEFT VENTRICLE PLAX 2D LVIDd:         4.70 cm  Diastology LVIDs:         4.10 cm  LV e' lateral:   7.83 cm/s LV PW:         1.00 cm  LV E/e' lateral: 8.1 LV IVS:        1.10 cm  LV e' medial:    5.55 cm/s LVOT diam:     2.00 cm  LV E/e' medial:  11.4 LV SV:         28 ml LV SV Index:   15.02 LVOT Area:     3.14 cm  RIGHT VENTRICLE RV S prime:     11.10 cm/s TAPSE (M-mode): 1.8 cm LEFT ATRIUM             Index       RIGHT ATRIUM          Index LA diam:        2.90 cm 1.56 cm/m  RA Area:     7.51 cm LA Vol (A2C):   56.3 ml 30.29 ml/m RA Volume:   11.70 ml 6.29 ml/m LA Vol (A4C):   38.1 ml 20.50 ml/m LA Biplane Vol: 46.2 ml 24.85 ml/m  AORTIC VALVE LVOT Vmax:   52.20 cm/s LVOT Vmean:  38.300 cm/s LVOT VTI:    0.110 m  AORTA Ao Root diam: 3.10 cm Ao Asc diam:  3.20 cm MITRAL VALVE MV Area (PHT): 3.21  cm             SHUNTS MV PHT:        68.44 msec           Systemic VTI:  0.11 m MV Decel Time: 236 msec             Systemic Diam: 2.00 cm MV E velocity: 63.40 cm/s 103 cm/s MV A velocity: 29.10 cm/s 70.3 cm/s MV E/A ratio:  2.18       1.5  Weston Brass MD Electronically signed by Weston Brass MD Signature Date/Time: 08/10/2019/3:08:31 PM    Final (Updated)     Microbiology: Recent Results (from the past 240 hour(s))  Culture, blood (routine x 2)     Status: None   Collection  Time: 08/22/19  8:08 AM   Specimen: BLOOD  Result Value Ref Range Status   Specimen Description   Final    BLOOD RIGHT ARM Performed at [facility], [address], [place], [place] [address]    Special Requests   Final    BOTTLES DRAWN AEROBIC ONLY Blood Culture adequate volume Performed at [facility], [address], [place], [place] [address]    Culture   Final    NO GROWTH 5 DAYS Performed at [facility], [address], [place], [place] [address]    Report Status 08/27/2019 FINAL  Final  Culture, blood (routine x 2)     Status: None   Collection Time: 08/22/19  8:12 AM   Specimen: BLOOD RIGHT HAND  Result Value Ref Range Status   Specimen Description   Final    BLOOD RIGHT HAND Performed at [facility], [address], [place], [place] [address]    Special Requests   Final    BOTTLES DRAWN AEROBIC ONLY Blood Culture results may not be optimal due to an inadequate volume of blood received in culture bottles Performed at [facility], [address], [place], [place] [address]    Culture   Final    NO GROWTH 5 DAYS Performed at [facility], [address], [place], [place] [address]    Report Status 08/27/2019 FINAL  Final     Labs: Basic Metabolic Panel: Recent Labs  Lab 08/25/19 0218 08/26/19 0208 08/27/19 0502 08/28/19 0508 08/29/19 1145 08/30/19 0547  NA 140 138 140 139 136  --   K 4.1 4.1  4.0 4.1 4.6  --   CL 108 107 106 104 104  --   CO2 22 18* 23 25 20*  --   GLUCOSE 150* 197* 142* 113* 109*  --   BUN 24* 30* 31* 29* 28*  --   CREATININE 1.50* 1.52* 1.40* 1.31* 1.36*  --   CALCIUM 7.6* 7.6* 7.8* 8.2* 8.0*  --   MG  --   --  1.8 2.2 1.8 1.9  PHOS  --   --  2.8 2.9 2.7 3.1   Liver Function Tests: Recent Labs  Lab 08/25/19 0218 08/26/19 0208 08/27/19 0502 08/28/19 0508 08/29/19 1145  AST 57* 52* 51* 44* 41  ALT 40 41 44 45* 39  ALKPHOS 54 63 51 56 57  BILITOT 0.8 0.3 0.4 0.3 0.8  PROT 6.2* 6.3* 6.1* 6.5 5.8*  ALBUMIN 2.1* 2.0* 2.1* 2.3* 2.2*   No results for input(s): LIPASE, AMYLASE in the last 168 hours. No results for input(s): AMMONIA in the last 168 hours. CBC: Recent Labs  Lab 08/26/19 0208 08/27/19 0502 08/28/19 0508 08/29/19 1145 08/30/19 0547  WBC 9.6 12.1* 12.7* 10.4 12.5*  NEUTROABS  --  10.4* 10.4* 8.5* 10.6*  HGB 13.4 12.4* 13.8 11.5* 13.5  HCT 42.7 39.6 43.3 36.5* 42.1  MCV 81.2 81.8 82.3 83.0 82.1  PLT 345 420* 456* 294 468*   Cardiac Enzymes: No results for input(s): CKTOTAL, CKMB, CKMBINDEX, TROPONINI in the last 168 hours. BNP: BNP (last 3 results) Recent Labs    08/19/19 1605  BNP 330.2*    ProBNP (last 3 results) No results for input(s): PROBNP in the last 8760 hours.  CBG: Recent Labs  Lab 08/29/19 2245 08/30/19 0242 08/30/19 0603 08/30/19 0750 08/30/19 1154  GLUCAP 243* 157* 135* 119* 131*       Signed:  Carolyne Littlesurtis Woods, MD [facility] [phone] pager

## 2019-08-30 NOTE — Progress Notes (Signed)
PROGRESS NOTE    Donald [name]  NWG:[id number] DOB: [birth year] DOA: 08/19/2019 PCP: Pearson Grippe, MD   Brief Narrative:  79 y.o.BM PMHx dementia, hypertension, recent COVID-19 infection for which patient was admitted and discharged 08/09/2019 - 08/12/2019 at which point he had syncope acute kidney injury as well as shortness of breath.Patient was discharged to follow-up with PCP. He has apparently been weak and debilitated since discharge.   Brought back today by wife due to worsening symptoms especially weakness. Patient is unable to give adequate history. He was found to be tachypneic but no evidence of pneumonia. Patient noted to have acute kidney injury. He has had decreased oral intake since discharge. Appears to be dehydrated. Patient is therefore being admitted with early sepsis especially with fever most likely post Covid syndrome with early pneumonia or UTI.   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 -Actemra held secondary to possible concurrent bacterial pneumonia -On previous admission completed course of ceftriaxone+ vancomycin see brief narrative  Bacterial pneumonia -Procalcitonin not consistent with bacterial pneumonia.  Acute  metabolic encephalopathy/chronic dementia (POA) -Per EMR Baseline per wife is essentially nonverbal but patient does ambulate on his own, feeds himself most days but does not cook, clean, or perform any ADLs as far as bathing, clothing, hygiene, etc. -Per EMR patient's wife indicates he is very "stubborn" and can get combative if he is prompted or provoked to do something he does not want to do such as ambulate or eat. 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  Essential hypertension.  Currently normotensive - Will continue with amlodipine 5 mg BID  Goals of care -1/20 PT/OT consult placed; do not believe patient can currently care for himself at home even with the help of his wife.  Has been readmitted within 30 days.  Per EMR Patient lives at home with wife who performs all ADLs and care.  Patient was at adult daycare until the recent outbreak of Covid at their facility. Tentative disposition at this time is likely back home with home health/hospital at home with wife although if patient does not improve drastically SNF may be reasonable pending clinical course and wife's ability to care for the patient appropriately (she is concerned about not seeing him or him becoming altered/confused/combative at new facility). -1/21 PT recommends SNF, consult placed to LCSW for placement -1/22 spoke with LCSW Shanita who is working on placement   DVT prophylaxis: Subcu heparin Code  Status: DNR Family Communication: 1/21  [name] (wife) counseled on plan of care and answered all questions Disposition  Plan:   Consultants:  Nephrology   Procedures/Significant Events:     I have personally reviewed and interpreted all radiology studies and my findings are as above.  VENTILATOR SETTINGS: Room air  1/24 SPO2 100%   Cultures   Antimicrobials: Anti-infectives (From admission, onward)   Start     Dose/Rate Stop   08/25/19 1000  remdesivir 100 mg in sodium chloride 0.9 % 100 mL IVPB     100 mg 200 mL/hr over 30 Minutes 08/29/19 0959   08/24/19 0945  remdesivir 200 mg in sodium chloride 0.9% 250 mL IVPB     200 mg 580 mL/hr over 30 Minutes 08/24/19 1748   08/24/19 0930  vancomycin (VANCOREADY) IVPB 1250 mg/250 mL  Status:  Discontinued     1,250 mg 166.7 mL/hr over 90 Minutes 08/24/19 0841   08/22/19 2100  cefTRIAXone (ROCEPHIN) 2 g in sodium chloride 0.9 % 100 mL IVPB  Status:  Discontinued     2 g 200 mL/hr over 30 Minutes 08/24/19 0745   08/22/19 0800  vancomycin (VANCOREADY) IVPB 1500 mg/300 mL     1,500 mg 150 mL/hr over 120 Minutes 08/22/19 1049   08/22/19 0745  vancomycin variable dose per unstable renal function (pharmacist dosing)  Status:  Discontinued      08/23/19 1358   08/19/19 2100  cefTRIAXone (ROCEPHIN) 1 g in sodium chloride 0.9 % 100 mL IVPB  Status:  Discontinued     1 g 200 mL/hr over 30 Minutes 08/22/19 0745       Devices    LINES / TUBES:      Continuous Infusions: . dextrose 150 mL/hr at 08/29/19 2248     Objective: Vitals:   08/29/19 0738 08/29/19 1647 08/29/19 2241 08/30/19 0500  BP: 136/80 (!) 141/71 (!) 152/96   Pulse: 91 87 89   Resp:   18   Temp: 98.4 F (36.9 C) 98.6 F (37 C) 98.4 F (36.9 C)   TempSrc: Axillary Axillary Axillary   SpO2:  97% 96%   Weight:    71.7 kg  Height:        Intake/Output Summary (Last 24 hours) at 08/30/2019 4431 Last data filed at 08/30/2019 5400 Gross per 24 hour  Intake --  Output 4600 ml  Net -4600 ml   Filed Weights   08/28/19 0500 08/29/19 0500 08/30/19 0500  Weight: 73.5 kg  73.5 kg 71.7 kg   Physical Exam:  General: Awake, tracks you with his eyes around room, occasionally answers yes and no to simple questions, no acute respiratory distress Eyes: negative scleral hemorrhage, negative anisocoria, negative icterus ENT: Negative Runny nose, negative gingival bleeding, Neck:  Negative scars, masses, torticollis, lymphadenopathy, JVD Lungs: Clear to auscultation bilaterally without wheezes or crackles Cardiovascular: Regular rate and rhythm without murmur gallop or rub normal S1 and S2 Abdomen: negative abdominal pain, nondistended, positive soft, bowel sounds, no rebound, no ascites, no appreciable mass Extremities: No significant cyanosis, clubbing, or edema bilateral lower extremities Skin: Negative rashes, lesions, ulcers Psychiatric: Unable to assess secondary to altered mental status Central nervous system:  Cranial nerves II through XII intact, tongue/uvula midline, moves all extremities spontaneously, follows some commands.       Data Reviewed: Care during the described time interval was provided by me .  I have reviewed this patient's available data, including medical history, events of note, physical examination,  and all test results as part of my evaluation.   CBC: Recent Labs  Lab 08/25/19 0218 08/26/19 0208 08/27/19 0502 08/28/19 0508 08/29/19 1145  WBC 9.2 9.6 12.1* 12.7* 10.4  NEUTROABS  --   --  10.4* 10.4* 8.5*  HGB 13.0 13.4 12.4* 13.8 11.5*  HCT 41.0 42.7 39.6 43.3 36.5*  MCV 81.8 81.2 81.8 82.3 83.0  PLT 304 345 420* 456* 294   Basic Metabolic Panel: Recent Labs  Lab 08/25/19 0218 08/26/19 0208 08/27/19 0502 08/28/19 0508 08/29/19 1145  NA 140 138 140 139 136  K 4.1 4.1 4.0 4.1 4.6  CL 108 107 106 104 104  CO2 22 18* 23 25 20*  GLUCOSE 150* 197* 142* 113* 109*  BUN 24* 30* 31* 29* 28*  CREATININE 1.50* 1.52* 1.40* 1.31* 1.36*  CALCIUM 7.6* 7.6* 7.8* 8.2* 8.0*  MG  --   --  1.8 2.2 1.8  PHOS  --   --  2.8 2.9 2.7    GFR: Estimated Creatinine Clearance: 41.9 mL/min (A) (by C-G formula based on SCr of 1.36 mg/dL (H)). Liver Function Tests: Recent Labs  Lab 08/25/19 0218 08/26/19 0208 08/27/19 0502 08/28/19 0508 08/29/19 1145  AST 57* 52* 51* 44* 41  ALT 40 41 44 45* 39  ALKPHOS 54 63 51 56 57  BILITOT 0.8 0.3 0.4 0.3 0.8  PROT 6.2* 6.3* 6.1* 6.5 5.8*  ALBUMIN 2.1* 2.0* 2.1* 2.3* 2.2*   No results for input(s): LIPASE, AMYLASE in the last 168 hours. No results for input(s): AMMONIA in the last 168 hours. Coagulation Profile: No results for input(s): INR, PROTIME in the last 168 hours. Cardiac Enzymes: No results for input(s): CKTOTAL, CKMB, CKMBINDEX, TROPONINI in the last 168 hours. BNP (last 3 results) No results for input(s): PROBNP in the last 8760 hours. HbA1C: No results for input(s): HGBA1C in the last 72 hours. CBG: Recent Labs  Lab 08/29/19 0820 08/29/19 1641 08/29/19 2245 08/30/19 0242 08/30/19 0603  GLUCAP 73 160* 243* 157* 135*   Lipid Profile: No results for input(s): CHOL, HDL, LDLCALC, TRIG, CHOLHDL, LDLDIRECT in the last 72 hours. Thyroid Function Tests: No results for input(s): TSH, T4TOTAL, FREET4, T3FREE, THYROIDAB in the last 72 hours. Anemia Panel: Recent Labs    08/28/19 0508 08/29/19 1145  FERRITIN 337* 304   Urine analysis:    Component Value Date/Time   COLORURINE AMBER (A) 09/18/2018 2355   APPEARANCEUR HAZY (A) 09/18/2018 2355   LABSPEC 1.023 09/18/2018 2355   PHURINE 5.0 09/18/2018 2355   GLUCOSEU NEGATIVE 09/18/2018 2355   HGBUR NEGATIVE 09/18/2018 2355   BILIRUBINUR MODERATE (A) 09/18/2018 2355   KETONESUR NEGATIVE 09/18/2018 2355   PROTEINUR 30 (A) 09/18/2018 2355   UROBILINOGEN 0.2 02/02/2014 1804   NITRITE NEGATIVE 09/18/2018 2355   LEUKOCYTESUR NEGATIVE 09/18/2018 2355   Sepsis Labs: @LABRCNTIP (procalcitonin:4,lacticidven:4)  ) Recent Results (from the past 240 hour(s))  Culture, blood (routine x 2)     Status: None    Collection Time: 08/22/19  8:08 AM   Specimen: BLOOD  Result Value Ref Range Status   Specimen Description   Final    BLOOD RIGHT ARM Performed at [facility], [address] [place], [place]    Special Requests   Final    BOTTLES DRAWN AEROBIC ONLY Blood Culture adequate volume Performed at [facility], [address], [place], [place]    Culture   Final    NO GROWTH 5 DAYS  Performed at [facility], [address], [place], [place] [address]    Report Status 08/27/2019 FINAL  Final  Culture, blood (routine x 2)     Status: None   Collection Time: 08/22/19  8:12 AM   Specimen: BLOOD RIGHT HAND  Result Value Ref Range Status   Specimen Description   Final    BLOOD RIGHT HAND Performed at [place] [address] [place], [place] [address]    Special Requests   Final    BOTTLES DRAWN AEROBIC ONLY Blood Culture results may not be optimal due to an inadequate volume of blood received in culture bottles Performed at [place] [address], [place], [place] [address]    Culture   Final    NO GROWTH 5 DAYS Performed at [facility], [place] [address] [place],  [address]    Report Status 08/27/2019 FINAL  Final         Radiology Studies: No results found.      Scheduled Meds: . amLODipine  5 mg Oral BID  . Chlorhexidine Gluconate Cloth  6 each Topical Daily  . heparin  5,000 Units Subcutaneous Q8H  . insulin aspart  0-9 Units Subcutaneous Q4H  . QUEtiapine  50 mg Oral QHS   Continuous Infusions: . dextrose 150 mL/hr at 08/29/19 2248     LOS: 11 days   The patient is critically ill with multiple organ systems failure and requires high complexity decision making for assessment and support, frequent evaluation and titration of therapies, application of advanced monitoring technologies and extensive interpretation of multiple databases.  Critical Care Time devoted to patient care services described in this note  Time spent: 40 minutes     Alfie Alderfer, Geraldo Docker, MD [facility] Pager [phone]  If 7PM-7AM, please contact night-coverage [email or web address] Password [facility] 08/30/2019, 6:51 AM

## 2019-08-30 NOTE — Care Management Important Message (Signed)
Important Message  Patient Details  Name: Donald [name] MRN: [id number] Date of Birth: 1941/01/17   Medicare Important Message Given:  Yes - Important Message mailed due to current National Emergency  Verbal consent obtained due to current National Emergency  Relationship to patient: Spouse/Significant Other Contact Name: Milus Height Call Date: 08/30/19  Time: 1413 Phone: [phone] Outcome: Spoke with contact Important Message mailed to: Patient address on file    [name] 08/30/2019, 2:13 PM

## 2019-08-30 NOTE — TOC Transition Note (Addendum)
Transition of Care [facility]) - CM/SW Discharge Note   Patient Details  Name: Donald [name] MRN: [id number] Date of Birth: 1940/10/12  [facility]) CM/SW Contact:  Shakeela Rabadan, Chrystine Oiler, RN Phone Number: 08/30/2019, 2:49 PM   Clinical Narrative:   Discussed SNF bed availability with wife. Patient discharging to [facility]. Bedside RN to call report to [phone] and arrange PTAR when ready.   ADDENDUM: Patient will not be able to DC to [facility] today due to needing a recovery bed and not a covid bed as his positive result is from 08/09/19. Wife updated.     Final next level of care: Skilled Nursing Facility Barriers to Discharge: Barriers Resolved   Patient Goals and CMS Choice Patient states their goals for this hospitalization and ongoing recovery are:: wife wants patient to go to rehab short term and return home CMS Medicare.gov Compare Post Acute Care list provided to:: Patient Represenative (must comment) Choice offered to / list presented to : Spouse  Discharge Placement              Patient chooses bed at: [facility] Patient to be transferred to facility by: PTAR Name of family member notified: wife Patient and family notified of of transfer: 08/30/19  Discharge Plan and Services In-house Referral: Clinical Social Work   Post Acute Care Choice: Skilled Nursing Facility                               Social Determinants of Health (SDOH) Interventions     Readmission Risk Interventions No flowsheet data found.

## 2019-08-30 NOTE — Plan of Care (Signed)

## 2019-08-31 DIAGNOSIS — E875 Hyperkalemia: Secondary | ICD-10-CM | POA: Diagnosis not present

## 2019-08-31 DIAGNOSIS — F0281 Dementia in other diseases classified elsewhere with behavioral disturbance: Secondary | ICD-10-CM | POA: Diagnosis not present

## 2019-08-31 DIAGNOSIS — Z978 Presence of other specified devices: Secondary | ICD-10-CM | POA: Diagnosis not present

## 2019-08-31 DIAGNOSIS — M6281 Muscle weakness (generalized): Secondary | ICD-10-CM | POA: Diagnosis not present

## 2019-08-31 DIAGNOSIS — R8290 Unspecified abnormal findings in urine: Secondary | ICD-10-CM | POA: Diagnosis not present

## 2019-08-31 DIAGNOSIS — R4182 Altered mental status, unspecified: Secondary | ICD-10-CM | POA: Diagnosis not present

## 2019-08-31 DIAGNOSIS — R509 Fever, unspecified: Secondary | ICD-10-CM | POA: Diagnosis not present

## 2019-08-31 DIAGNOSIS — R41841 Cognitive communication deficit: Secondary | ICD-10-CM | POA: Diagnosis not present

## 2019-08-31 DIAGNOSIS — F0280 Dementia in other diseases classified elsewhere without behavioral disturbance: Secondary | ICD-10-CM | POA: Diagnosis not present

## 2019-08-31 DIAGNOSIS — R404 Transient alteration of awareness: Secondary | ICD-10-CM | POA: Diagnosis not present

## 2019-08-31 DIAGNOSIS — F0390 Unspecified dementia without behavioral disturbance: Secondary | ICD-10-CM | POA: Diagnosis not present

## 2019-08-31 DIAGNOSIS — J1282 Pneumonia due to coronavirus disease 2019: Secondary | ICD-10-CM | POA: Diagnosis not present

## 2019-08-31 DIAGNOSIS — I10 Essential (primary) hypertension: Secondary | ICD-10-CM | POA: Diagnosis not present

## 2019-08-31 DIAGNOSIS — Z79899 Other long term (current) drug therapy: Secondary | ICD-10-CM | POA: Diagnosis not present

## 2019-08-31 DIAGNOSIS — G309 Alzheimer's disease, unspecified: Secondary | ICD-10-CM | POA: Diagnosis not present

## 2019-08-31 DIAGNOSIS — R1312 Dysphagia, oropharyngeal phase: Secondary | ICD-10-CM | POA: Diagnosis not present

## 2019-08-31 DIAGNOSIS — Z436 Encounter for attention to other artificial openings of urinary tract: Secondary | ICD-10-CM | POA: Diagnosis not present

## 2019-08-31 DIAGNOSIS — R2689 Other abnormalities of gait and mobility: Secondary | ICD-10-CM | POA: Diagnosis not present

## 2019-08-31 DIAGNOSIS — Z8616 Personal history of COVID-19: Secondary | ICD-10-CM | POA: Diagnosis not present

## 2019-08-31 DIAGNOSIS — N138 Other obstructive and reflux uropathy: Secondary | ICD-10-CM | POA: Diagnosis not present

## 2019-08-31 DIAGNOSIS — L0390 Cellulitis, unspecified: Secondary | ICD-10-CM | POA: Diagnosis not present

## 2019-08-31 DIAGNOSIS — R531 Weakness: Secondary | ICD-10-CM | POA: Diagnosis not present

## 2019-08-31 DIAGNOSIS — Z20822 Contact with and (suspected) exposure to covid-19: Secondary | ICD-10-CM | POA: Diagnosis not present

## 2019-08-31 DIAGNOSIS — R2681 Unsteadiness on feet: Secondary | ICD-10-CM | POA: Diagnosis not present

## 2019-08-31 DIAGNOSIS — Z7401 Bed confinement status: Secondary | ICD-10-CM | POA: Diagnosis not present

## 2019-08-31 DIAGNOSIS — L0103 Bullous impetigo: Secondary | ICD-10-CM | POA: Diagnosis not present

## 2019-08-31 DIAGNOSIS — U071 COVID-19: Secondary | ICD-10-CM | POA: Diagnosis not present

## 2019-08-31 DIAGNOSIS — M2550 Pain in unspecified joint: Secondary | ICD-10-CM | POA: Diagnosis not present

## 2019-08-31 DIAGNOSIS — A419 Sepsis, unspecified organism: Secondary | ICD-10-CM | POA: Diagnosis not present

## 2019-08-31 DIAGNOSIS — J9601 Acute respiratory failure with hypoxia: Secondary | ICD-10-CM | POA: Diagnosis not present

## 2019-08-31 DIAGNOSIS — R278 Other lack of coordination: Secondary | ICD-10-CM | POA: Diagnosis not present

## 2019-08-31 DIAGNOSIS — N179 Acute kidney failure, unspecified: Secondary | ICD-10-CM | POA: Diagnosis not present

## 2019-08-31 DIAGNOSIS — R6520 Severe sepsis without septic shock: Secondary | ICD-10-CM | POA: Diagnosis not present

## 2019-08-31 DIAGNOSIS — B001 Herpesviral vesicular dermatitis: Secondary | ICD-10-CM | POA: Diagnosis not present

## 2019-08-31 DIAGNOSIS — J189 Pneumonia, unspecified organism: Secondary | ICD-10-CM | POA: Diagnosis not present

## 2019-08-31 DIAGNOSIS — I129 Hypertensive chronic kidney disease with stage 1 through stage 4 chronic kidney disease, or unspecified chronic kidney disease: Secondary | ICD-10-CM | POA: Diagnosis not present

## 2019-08-31 DIAGNOSIS — N390 Urinary tract infection, site not specified: Secondary | ICD-10-CM | POA: Diagnosis not present

## 2019-08-31 DIAGNOSIS — R5381 Other malaise: Secondary | ICD-10-CM | POA: Diagnosis not present

## 2019-08-31 DIAGNOSIS — R319 Hematuria, unspecified: Secondary | ICD-10-CM | POA: Diagnosis not present

## 2019-08-31 LAB — CBC WITH DIFFERENTIAL/PLATELET
Abs Immature Granulocytes: 0.23 10*3/uL — ABNORMAL HIGH (ref 0.00–0.07)
Basophils Absolute: 0.1 10*3/uL (ref 0.0–0.1)
Basophils Relative: 1 %
Eosinophils Absolute: 0.1 10*3/uL (ref 0.0–0.5)
Eosinophils Relative: 1 %
HCT: 48.5 % (ref 39.0–52.0)
Hemoglobin: 15.1 g/dL (ref 13.0–17.0)
Immature Granulocytes: 2 %
Lymphocytes Relative: 15 %
Lymphs Abs: 1.4 10*3/uL (ref 0.7–4.0)
MCH: 25.8 pg — ABNORMAL LOW (ref 26.0–34.0)
MCHC: 31.1 g/dL (ref 30.0–36.0)
MCV: 82.8 fL (ref 80.0–100.0)
Monocytes Absolute: 0.7 10*3/uL (ref 0.1–1.0)
Monocytes Relative: 7 %
Neutro Abs: 7 10*3/uL (ref 1.7–7.7)
Neutrophils Relative %: 74 %
Platelets: 253 10*3/uL (ref 150–400)
RBC: 5.86 MIL/uL — ABNORMAL HIGH (ref 4.22–5.81)
RDW: 16.9 % — ABNORMAL HIGH (ref 11.5–15.5)
WBC: 9.5 10*3/uL (ref 4.0–10.5)
nRBC: 0 % (ref 0.0–0.2)

## 2019-08-31 LAB — COMPREHENSIVE METABOLIC PANEL
ALT: 33 U/L (ref 0–44)
AST: 35 U/L (ref 15–41)
Albumin: 2.5 g/dL — ABNORMAL LOW (ref 3.5–5.0)
Alkaline Phosphatase: 57 U/L (ref 38–126)
Anion gap: 13 (ref 5–15)
BUN: 25 mg/dL — ABNORMAL HIGH (ref 8–23)
CO2: 24 mmol/L (ref 22–32)
Calcium: 8.6 mg/dL — ABNORMAL LOW (ref 8.9–10.3)
Chloride: 102 mmol/L (ref 98–111)
Creatinine, Ser: 1.35 mg/dL — ABNORMAL HIGH (ref 0.61–1.24)
GFR calc Af Amer: 57 mL/min — ABNORMAL LOW (ref >60)
GFR calc non Af Amer: 49 mL/min — ABNORMAL LOW (ref >60)
Glucose, Bld: 81 mg/dL (ref 70–99)
Potassium: 4.4 mmol/L (ref 3.5–5.1)
Sodium: 139 mmol/L (ref 135–145)
Total Bilirubin: 0.9 mg/dL (ref 0.3–1.2)
Total Protein: 6.8 g/dL (ref 6.5–8.1)

## 2019-08-31 LAB — PHOSPHORUS: Phosphorus: 3.7 mg/dL (ref 2.5–4.6)

## 2019-08-31 LAB — MAGNESIUM: Magnesium: 1.8 mg/dL (ref 1.7–2.4)

## 2019-08-31 LAB — GLUCOSE, CAPILLARY
Glucose-Capillary: 95 mg/dL (ref 70–99)
Glucose-Capillary: 112 mg/dL — ABNORMAL HIGH (ref 70–99)
Glucose-Capillary: 169 mg/dL — ABNORMAL HIGH (ref 70–99)

## 2019-08-31 LAB — FERRITIN: Ferritin: 352 ng/mL — ABNORMAL HIGH (ref 24–336)

## 2019-08-31 LAB — C-REACTIVE PROTEIN: CRP: 1.2 mg/dL — ABNORMAL HIGH (ref <1.0)

## 2019-08-31 LAB — D-DIMER, QUANTITATIVE: D-Dimer, Quant: 6.65 ug/mL-FEU — ABNORMAL HIGH (ref 0.00–0.50)

## 2019-08-31 NOTE — TOC Transition Note (Signed)
Transition of Care [facility]) - CM/SW Discharge Note   Patient Details  Name: Donald [name] MRN: [id number] Date of Birth: 03/18/1941  Transition of Care [facility]) CM/SW Contact:  Elliot Gault, LCSW Phone Number: 08/31/2019, 3:55 PM   Clinical Narrative:     TOC following. Pt unable to dc to [facility] due to bed status. Pt was offered [facility] but pt's wife prefers [facility] and they can take pt today. DC clinical sent electronically. RN called report. EMS forms printed to the unit. PTAR called.  There are no other TOC needs for dc.  Final next level of care: Skilled Nursing Facility Barriers to Discharge: Barriers Resolved   Patient Goals and CMS Choice Patient states their goals for this hospitalization and ongoing recovery are:: wife wants patient to go to rehab short term and return home CMS Medicare.gov Compare Post Acute Care list provided to:: Patient Represenative (must comment) Choice offered to / list presented to : Spouse  Discharge Placement              Patient chooses bed at: [facility] Patient to be transferred to facility by: PTAR Name of family member notified: wife Patient and family notified of of transfer: 08/31/19  Discharge Plan and Services In-house Referral: Clinical Social Work   Post Acute Care Choice: Skilled Nursing Facility                               Social Determinants of Health (SDOH) Interventions     Readmission Risk Interventions No flowsheet data found.

## 2019-08-31 NOTE — Progress Notes (Signed)
Called [facility] and no answer from [facility] RN at [phone]. Called again at 1458 and spoke to [facility] who said this patient isn't in their system. On hold for further assistance.

## 2019-08-31 NOTE — Consult Note (Addendum)
   [facility] Inpatient Consult   08/31/2019  Shamari Lofquist Dakin 07/24/1941 [id number]    Update Note:   Addendum:   09/01/19 Per transition of care SW note, patient was unable to discharge to [facility] due to bed status. [facility] chose [facility] over [facility].  Patient discharged to SNF: [facility] on 08/31/19.    Noted that this Medicare/ [employer] patient with 27% high risk score for readmission, a 7 day and 30 day readmissions in the past 6 months, is currently being transitioned to skilled nursing facility.  Per transition of care CM notes, PT recommending SNF (skilled nursing facility). Patient will discharge to [facility] once ready, and wife is aware.  [facility] post acute RN coordinator will be made aware of patient's disposition for post discharge follow up of needs.   For questions and additional information, please contact:  Casanova Schurman A. Lilliona Blakeney, BSN, RN-BC [facility] Liaison Cell: [phone]

## 2019-09-02 ENCOUNTER — Other Ambulatory Visit: Payer: Self-pay | Admitting: *Deleted

## 2019-09-02 DIAGNOSIS — R6520 Severe sepsis without septic shock: Secondary | ICD-10-CM | POA: Diagnosis not present

## 2019-09-02 DIAGNOSIS — U071 COVID-19: Secondary | ICD-10-CM | POA: Diagnosis not present

## 2019-09-02 DIAGNOSIS — A419 Sepsis, unspecified organism: Secondary | ICD-10-CM | POA: Diagnosis not present

## 2019-09-02 DIAGNOSIS — J1282 Pneumonia due to coronavirus disease 2019: Secondary | ICD-10-CM | POA: Diagnosis not present

## 2019-09-02 NOTE — Patient Outreach (Signed)
Screened for potential [facility] Care Management needs as a benefit of  [employer] Medicare.  Member is currently receiving skilled therapy at [facility].  Writer attended telephonic interdisciplinary team meeting to assess for disposition needs and transition plan for resident.   Member is from home with wife. Will continue follow for transition plans and potential [facility] Care Management needs.   Raiford Noble, MSN-Ed, RN,BSN [facility] Post Acute Care Coordinator [phone] [facility]) [phone]  (Toll free office)

## 2019-09-09 DIAGNOSIS — A419 Sepsis, unspecified organism: Secondary | ICD-10-CM | POA: Diagnosis not present

## 2019-09-09 DIAGNOSIS — U071 COVID-19: Secondary | ICD-10-CM | POA: Diagnosis not present

## 2019-09-09 DIAGNOSIS — J1282 Pneumonia due to coronavirus disease 2019: Secondary | ICD-10-CM | POA: Diagnosis not present

## 2019-09-09 DIAGNOSIS — R6520 Severe sepsis without septic shock: Secondary | ICD-10-CM | POA: Diagnosis not present

## 2019-09-12 ENCOUNTER — Other Ambulatory Visit: Payer: Self-pay

## 2019-09-12 ENCOUNTER — Emergency Department (HOSPITAL_COMMUNITY)
Admission: EM | Admit: 2019-09-12 | Discharge: 2019-09-13 | Disposition: A | Discharge status: Skilled Nursing Facility | Payer: Medicare Other | Attending: Emergency Medicine | Admitting: Emergency Medicine

## 2019-09-12 ENCOUNTER — Encounter (HOSPITAL_COMMUNITY): Payer: Self-pay

## 2019-09-12 DIAGNOSIS — T83021A Displacement of indwelling urethral catheter, initial encounter: Secondary | ICD-10-CM

## 2019-09-12 DIAGNOSIS — F0281 Dementia in other diseases classified elsewhere with behavioral disturbance: Secondary | ICD-10-CM | POA: Insufficient documentation

## 2019-09-12 DIAGNOSIS — G309 Alzheimer's disease, unspecified: Secondary | ICD-10-CM | POA: Insufficient documentation

## 2019-09-12 DIAGNOSIS — Z436 Encounter for attention to other artificial openings of urinary tract: Secondary | ICD-10-CM | POA: Insufficient documentation

## 2019-09-12 DIAGNOSIS — I10 Essential (primary) hypertension: Secondary | ICD-10-CM | POA: Diagnosis not present

## 2019-09-12 DIAGNOSIS — R319 Hematuria, unspecified: Secondary | ICD-10-CM

## 2019-09-12 DIAGNOSIS — U071 COVID-19: Secondary | ICD-10-CM | POA: Diagnosis not present

## 2019-09-12 DIAGNOSIS — Z79899 Other long term (current) drug therapy: Secondary | ICD-10-CM | POA: Insufficient documentation

## 2019-09-12 NOTE — ED Triage Notes (Signed)
Pt BIB [facility] from [facility] due toe blood coming out of foley. Staff attempted to replace foley x2 today, and since being changed, pt has had little urine output and only blood in catheter. Pt tested positive for COVID on 1/26, was given "2 tablets of Tylenol by facility at 2230.  Pt has hx of dementia.

## 2019-09-13 DIAGNOSIS — F0280 Dementia in other diseases classified elsewhere without behavioral disturbance: Secondary | ICD-10-CM | POA: Diagnosis not present

## 2019-09-13 DIAGNOSIS — B001 Herpesviral vesicular dermatitis: Secondary | ICD-10-CM | POA: Diagnosis not present

## 2019-09-13 NOTE — ED Notes (Signed)
Jasmine at [facility] called to notify of pt return.   PTAR called for transport.

## 2019-09-13 NOTE — ED Provider Notes (Addendum)
[facility]-EMERGENCY DEPT Provider Note  CSN: [id number] Arrival date & time: 09/12/19 2325  Chief Complaint(s) Foley Issue and COVID+  ED Triage Notes Zenda Alpers, RN (Registered Nurse) . . 09/12/2019 11:43 PM . . Signed   Pt BIB [facility] from [facility] due toe blood coming out of foley. Staff attempted to replace foley x2 today, and since being changed, pt has had little urine output and only blood in catheter. Pt tested positive for COVID on 1/26, was given "2 tablets of Tylenol by facility at 2230.  Pt has hx of dementia.       HPI Donald [name] is a 79 y.o. male with a past medical history listed below who presents from skilled nursing facility for problem with placement of Foley catheter.  Per EMS, facility attempted to replace catheter twice earlier today.  Noted blood during the attempt.  No urine was draining.  Sent here for assistance.  Of note patient tested positive for COVID-19 and had a fever in route by EMS.  Remainder of history, ROS, and physical exam limited due to patient's condition (dementia). Additional information was obtained from EMS.   Level V Caveat.    HPI  Past Medical History Past Medical History:  Diagnosis Date  . Dementia (HCC)   . Hypertension    Patient Active Problem List   Diagnosis Date Noted  . ARF (acute renal failure) (HCC) 08/19/2019  . Near syncope 08/10/2019  . Syncope 08/09/2019  . AKI (acute kidney injury) (HCC) 08/09/2019  . Dementia with behavioral disturbance (HCC) 12/15/2015  . Tonic seizure (HCC) 12/15/2015  . Altered mental status 10/29/2015  . Elevated lactic acid level 10/29/2015  . Encephalopathy acute 10/29/2015  . Enlarged prostate 10/29/2015  . Hypertension 10/29/2015  . 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 Authorizing Provider  acetaminophen (TYLENOL) 325 MG  tablet Take 2 tablets (650 mg total) by mouth every 6 (six) hours as needed for mild pain (or Fever >/= 101). 08/30/19   Drema Dallas, MD  albuterol (VENTOLIN HFA) 108 (90 Base) MCG/ACT inhaler Inhale 2 puffs into the lungs every 4 (four) hours as needed for wheezing or shortness of breath. 08/30/19   Drema Dallas, MD  Amino Acids-Protein Hydrolys (FEEDING SUPPLEMENT, PRO-STAT SUGAR FREE 64,) LIQD Take 30 mLs by mouth 2 (two) times daily. 08/12/19   Lorin Glass, MD  amLODipine (NORVASC) 5 MG tablet Take 1 tablet (5 mg total) by mouth 2 (two) times daily. 08/30/19   Drema Dallas, MD  camphor-menthol [facility]) lotion Apply 1 application topically every 8 (eight) hours as needed for itching. Apply to affected area 08/30/19   Drema Dallas, MD  hydrOXYzine (ATARAX/VISTARIL) 25 MG tablet Take 1 tablet (25 mg total) by mouth every 8 (eight) hours as needed for itching. 08/30/19   Drema Dallas, MD  Nutritional Supplements (FEEDING SUPPLEMENT, NEPRO CARB STEADY,) LIQD Take 237 mLs by mouth 3 (three) times daily as needed (Supplement). 08/30/19   Drema Dallas, MD  ondansetron (ZOFRAN) 4 MG tablet Take 1 tablet (4 mg total) by mouth every 6 (six) hours as needed for nausea. 08/30/19   Drema Dallas, MD  pantoprazole (PROTONIX) 40 MG tablet Take 40 mg by mouth daily. 06/02/19   [provider]  QUEtiapine (SEROQUEL) 50 MG tablet Take 1 tablet (50 mg total) by mouth at bedtime. 08/30/19  Allie Bossier, MD  sorbitol 70 % SOLN Take 30 mLs by mouth as needed for moderate constipation. 08/30/19   Allie Bossier, MD                                                                                                                                    Past Surgical History Past Surgical History:  Procedure Laterality Date  . NO PAST SURGERIES     Family History Family History  Problem Relation Age of Onset  . Hypertension Mother   . Dementia Sister     Social History Social History   Tobacco  Use  . Smoking status: Never Smoker  . Smokeless tobacco: Never Used  Substance Use Topics  . Alcohol use: No    Alcohol/week: 0.0 standard drinks  . Drug use: No   Allergies Patient has no known allergies.  Review of Systems Review of Systems  Unable to perform ROS: Dementia    Physical Exam Vital Signs  I have reviewed the triage vital signs BP (!) 150/92   Pulse (!) 102   Temp (!) 100.9 F (38.3 C) (Rectal)   Resp (!) 22   SpO2 100%   Physical Exam Vitals reviewed.  Constitutional:      General: He is not in acute distress.    Appearance: He is well-developed. He is not diaphoretic.     Comments: With hand mittens  HENT:     Head: Normocephalic and atraumatic.     Jaw: No trismus.     Right Ear: External ear normal.     Left Ear: External ear normal.     Nose: Nose normal.  Eyes:     General: No scleral icterus.    Conjunctiva/sclera: Conjunctivae normal.  Neck:     Trachea: Phonation normal.  Cardiovascular:     Rate and Rhythm: Regular rhythm. Tachycardia present.  Pulmonary:     Effort: Pulmonary effort is normal. No respiratory distress.     Breath sounds: No stridor.  Abdominal:     General: There is no distension.  Musculoskeletal:        General: Normal range of motion.     Cervical back: Normal range of motion.  Neurological:     Mental Status: He is alert.     Comments: Responds yes to some questions. Moves all extremities. Intermittently follows command.  Psychiatric:        Behavior: Behavior normal.     ED Results and Treatments Labs (all labs ordered are listed, but only abnormal results are displayed) Labs Reviewed - No data to display  EKG  EKG Interpretation  Date/Time:    Ventricular Rate:    PR Interval:    QRS Duration:   QT Interval:    QTC Calculation:   R Axis:     Text Interpretation:         Radiology No results found.  Pertinent labs & imaging results that were available during my care of the patient were reviewed by me and considered in my medical decision making (see chart for details).  Medications Ordered in ED Medications - No data to display                                                                                                                                  Procedures Ultrasound ED Abd  Date/Time: 09/13/2019 12:07 AM Performed by: Nira Conn, MD Authorized by: Nira Conn, MD   Procedure details:    Indications: decreased urinary output     Scope of abdominal ultrasound: urinary retention and verify foley placement.   Bladder:  Visualized    Images: archived    Bladder findings:    Volume:  Approx 350cc Comments:     Initially follow was not seen within the bladder. After advancing, foley tip and balloon were visualized within the bladder.    (including critical care time)  Medical Decision Making / ED Course I have reviewed the nursing notes for this encounter and the patient's prior records (if available in EHR or on provided paperwork).   Donald [name] was evaluated in Emergency Department on 09/13/2019 for the symptoms described in the history of present illness. He was evaluated in the context of the global COVID-19 pandemic, which necessitated consideration that the patient might be at risk for infection with the SARS-CoV-2 virus that causes COVID-19. Institutional protocols and algorithms that pertain to the evaluation of patients at risk for COVID-19 are in a state of rapid change based on information released by regulatory bodies including the CDC and federal and state organizations. These policies and algorithms were followed during the patient's care in the ED.  Verified foley placement with POCUS. Foley flushed and bladder irrigated with small volume saline. Draining well with 400 cc within the first 5  min.  Regarding his COVID status, patient is nontoxic appearing and not hypoxic. Felt stable for DC back to sNF.     Final Clinical Impression(s) / ED Diagnoses Final diagnoses:  Displacement of Foley catheter, initial encounter [facility])  Traumatic hematuria  COVID-19 virus infection   The patient appears reasonably screened and/or stabilized for discharge and I doubt any other medical condition or other [facility] requiring further screening, evaluation, or treatment in the ED at this time prior to discharge. Safe for discharge with strict return precautions.  Disposition: Discharge to sNF  Condition: Good   ED Discharge Orders    None        Follow Up: Urology  This chart was dictated using voice recognition software.  Despite best efforts to proofread,  errors can occur which can change the documentation meaning.     Nira Conn, MD 09/13/19 [phone]

## 2019-09-13 NOTE — ED Notes (Signed)
Dr. Eudelia Bunch at bedside with ultrasound machine. Catheter balloon deflated, catheter advanced until seen on ultrasound. Catheter balloon inflated using 71mL of fluid. Catheter irrigated with 41mL of fluid and urine return observed in tubing and drainage bag. of urine obtained in drainage bag. Drainage bag and tubing changed.

## 2019-09-13 NOTE — Discharge Instructions (Addendum)
Please follow up with Urologist regarding the traumatic foley placement.

## 2019-09-14 DIAGNOSIS — B001 Herpesviral vesicular dermatitis: Secondary | ICD-10-CM | POA: Diagnosis not present

## 2019-09-14 DIAGNOSIS — U071 COVID-19: Secondary | ICD-10-CM | POA: Diagnosis not present

## 2019-09-14 DIAGNOSIS — E875 Hyperkalemia: Secondary | ICD-10-CM | POA: Diagnosis not present

## 2019-09-14 DIAGNOSIS — Z978 Presence of other specified devices: Secondary | ICD-10-CM | POA: Diagnosis not present

## 2019-09-15 DIAGNOSIS — N138 Other obstructive and reflux uropathy: Secondary | ICD-10-CM | POA: Diagnosis not present

## 2019-09-15 DIAGNOSIS — L0390 Cellulitis, unspecified: Secondary | ICD-10-CM | POA: Diagnosis not present

## 2019-09-15 DIAGNOSIS — L0103 Bullous impetigo: Secondary | ICD-10-CM | POA: Diagnosis not present

## 2019-09-17 ENCOUNTER — Other Ambulatory Visit: Payer: Self-pay | Admitting: *Deleted

## 2019-09-17 NOTE — Patient Outreach (Signed)
Late entry for 09/16/19  Screened for potential [facility] Care Management needs as a benefit of  [employer].  Member is currently receiving skilled therapy at [facility].   Writer attended telephonic interdisciplinary team meeting to assess for disposition needs and transition plan for resident.   Facility reports member requires moderate assistance with therapy. Anticipated transition plan is to return home with wife.   Will plan outreach to member's wife to discuss transition plans and potential [facility] services.  Raiford Noble, MSN-Ed, RN,BSN [facility] Post Acute Care Coordinator [phone] [facility]) [phone]  (Toll free office)

## 2019-09-20 ENCOUNTER — Emergency Department (HOSPITAL_COMMUNITY)
Admission: EM | Admit: 2019-09-20 | Discharge: 2019-09-20 | Disposition: A | Discharge status: Home / Self Care | Payer: Medicare Other | Attending: Emergency Medicine | Admitting: Emergency Medicine

## 2019-09-20 ENCOUNTER — Emergency Department (HOSPITAL_COMMUNITY): Payer: Medicare Other

## 2019-09-20 ENCOUNTER — Encounter (HOSPITAL_COMMUNITY): Payer: Self-pay | Admitting: Family Medicine

## 2019-09-20 DIAGNOSIS — Z8616 Personal history of COVID-19: Secondary | ICD-10-CM | POA: Diagnosis not present

## 2019-09-20 DIAGNOSIS — G309 Alzheimer's disease, unspecified: Secondary | ICD-10-CM | POA: Diagnosis not present

## 2019-09-20 DIAGNOSIS — F0280 Dementia in other diseases classified elsewhere without behavioral disturbance: Secondary | ICD-10-CM | POA: Insufficient documentation

## 2019-09-20 DIAGNOSIS — R531 Weakness: Secondary | ICD-10-CM

## 2019-09-20 DIAGNOSIS — R8290 Unspecified abnormal findings in urine: Secondary | ICD-10-CM | POA: Diagnosis not present

## 2019-09-20 DIAGNOSIS — U071 COVID-19: Secondary | ICD-10-CM | POA: Diagnosis not present

## 2019-09-20 DIAGNOSIS — Z79899 Other long term (current) drug therapy: Secondary | ICD-10-CM | POA: Insufficient documentation

## 2019-09-20 DIAGNOSIS — F0390 Unspecified dementia without behavioral disturbance: Secondary | ICD-10-CM | POA: Diagnosis not present

## 2019-09-20 DIAGNOSIS — J189 Pneumonia, unspecified organism: Secondary | ICD-10-CM | POA: Diagnosis not present

## 2019-09-20 DIAGNOSIS — I10 Essential (primary) hypertension: Secondary | ICD-10-CM | POA: Insufficient documentation

## 2019-09-20 DIAGNOSIS — N179 Acute kidney failure, unspecified: Secondary | ICD-10-CM | POA: Diagnosis not present

## 2019-09-20 HISTORY — DX: Dysphagia, oropharyngeal phase: R13.12

## 2019-09-20 HISTORY — DX: Other abnormalities of gait and mobility: R26.89

## 2019-09-20 HISTORY — DX: COVID-19: U07.1

## 2019-09-20 HISTORY — DX: Gastro-esophageal reflux disease without esophagitis: K21.9

## 2019-09-20 HISTORY — DX: Pneumonia due to coronavirus disease 2019: J12.82

## 2019-09-20 HISTORY — DX: Unsteadiness on feet: R26.81

## 2019-09-20 HISTORY — DX: Acute kidney failure, unspecified: N17.9

## 2019-09-20 HISTORY — DX: Other lack of coordination: R27.8

## 2019-09-20 HISTORY — DX: Sepsis, unspecified organism: A41.9

## 2019-09-20 HISTORY — DX: Acute respiratory failure with hypoxia: J96.01

## 2019-09-20 HISTORY — DX: Unspecified hydronephrosis: N13.30

## 2019-09-20 HISTORY — DX: Sepsis, unspecified organism: R65.21

## 2019-09-20 HISTORY — DX: Cognitive communication deficit: R41.841

## 2019-09-20 HISTORY — DX: Benign prostatic hyperplasia without lower urinary tract symptoms: N40.0

## 2019-09-20 HISTORY — DX: Metabolic encephalopathy: G93.41

## 2019-09-20 HISTORY — DX: Other seizures: G40.89

## 2019-09-20 HISTORY — DX: Muscle weakness (generalized): M62.81

## 2019-09-20 LAB — COMPREHENSIVE METABOLIC PANEL
ALT: 20 U/L (ref 0–44)
AST: 29 U/L (ref 15–41)
Albumin: 2.6 g/dL — ABNORMAL LOW (ref 3.5–5.0)
Alkaline Phosphatase: 50 U/L (ref 38–126)
Anion gap: 10 (ref 5–15)
BUN: 28 mg/dL — ABNORMAL HIGH (ref 8–23)
CO2: 23 mmol/L (ref 22–32)
Calcium: 8.3 mg/dL — ABNORMAL LOW (ref 8.9–10.3)
Chloride: 108 mmol/L (ref 98–111)
Creatinine, Ser: 2.12 mg/dL — ABNORMAL HIGH (ref 0.61–1.24)
GFR calc Af Amer: 33 mL/min — ABNORMAL LOW (ref >60)
GFR calc non Af Amer: 29 mL/min — ABNORMAL LOW (ref >60)
Glucose, Bld: 159 mg/dL — ABNORMAL HIGH (ref 70–99)
Potassium: 4.1 mmol/L (ref 3.5–5.1)
Sodium: 141 mmol/L (ref 135–145)
Total Bilirubin: 0.4 mg/dL (ref 0.3–1.2)
Total Protein: 7.2 g/dL (ref 6.5–8.1)

## 2019-09-20 LAB — URINALYSIS, ROUTINE W REFLEX MICROSCOPIC
Bilirubin Urine: NEGATIVE
Glucose, UA: NEGATIVE mg/dL
Ketones, ur: NEGATIVE mg/dL
Nitrite: NEGATIVE
Protein, ur: 30 mg/dL — AB
RBC / HPF: >50 RBC/hpf — ABNORMAL HIGH (ref 0–5)
Specific Gravity, Urine: 1.02 (ref 1.005–1.030)
WBC, UA: >50 WBC/hpf — ABNORMAL HIGH (ref 0–5)
pH: 5 (ref 5.0–8.0)

## 2019-09-20 LAB — CBC WITH DIFFERENTIAL/PLATELET
Abs Immature Granulocytes: 0.15 10*3/uL — ABNORMAL HIGH (ref 0.00–0.07)
Basophils Absolute: 0.1 10*3/uL (ref 0.0–0.1)
Basophils Relative: 1 %
Eosinophils Absolute: 0.1 10*3/uL (ref 0.0–0.5)
Eosinophils Relative: 1 %
HCT: 41.4 % (ref 39.0–52.0)
Hemoglobin: 12.4 g/dL — ABNORMAL LOW (ref 13.0–17.0)
Immature Granulocytes: 2 %
Lymphocytes Relative: 13 %
Lymphs Abs: 1.4 10*3/uL (ref 0.7–4.0)
MCH: 26.3 pg (ref 26.0–34.0)
MCHC: 30 g/dL (ref 30.0–36.0)
MCV: 87.7 fL (ref 80.0–100.0)
Monocytes Absolute: 1 10*3/uL (ref 0.1–1.0)
Monocytes Relative: 9 %
Neutro Abs: 7.6 10*3/uL (ref 1.7–7.7)
Neutrophils Relative %: 74 %
Platelets: 735 10*3/uL — ABNORMAL HIGH (ref 150–400)
RBC: 4.72 MIL/uL (ref 4.22–5.81)
RDW: 15.9 % — ABNORMAL HIGH (ref 11.5–15.5)
WBC: 10.2 10*3/uL (ref 4.0–10.5)
nRBC: 0 % (ref 0.0–0.2)

## 2019-09-20 LAB — CREATININE, URINE, RANDOM: Creatinine, Urine: 257.93 mg/dL

## 2019-09-20 LAB — SODIUM, URINE, RANDOM: Sodium, Ur: 48 mmol/L

## 2019-09-20 MED ORDER — SODIUM CHLORIDE 0.9 % IV SOLN
1.0000 g | Freq: Once | INTRAVENOUS | Status: AC
  Administered 2019-09-20: 21:00:00 1 g via INTRAVENOUS
  Filled 2019-09-20: qty 10

## 2019-09-20 MED ORDER — CEPHALEXIN 500 MG PO CAPS: 500.0000 mg | ORAL_CAPSULE | Freq: Three times a day (TID) | ORAL | 0 refills | Status: DC

## 2019-09-20 MED ORDER — LACTATED RINGERS IV BOLUS
1000.0000 mL | Freq: Once | INTRAVENOUS | Status: AC
  Administered 2019-09-20: 1000 mL via INTRAVENOUS

## 2019-09-20 NOTE — ED Notes (Signed)
[facility] notified for need of transport back to facility.

## 2019-09-20 NOTE — ED Notes (Addendum)
Patient up on side of bed with 2 assist. Patient shuffled side ways to top of bed. Patient unable to safely ambulate around room

## 2019-09-20 NOTE — ED Triage Notes (Signed)
Patient is from [facility] and transported via [facility]. Patient has a history of dementia, unknown baseline, but staff report he is more altered than normal. Also, staff informed EMS is his experiencing generalized weakness since Thursday. Physical therapy reported to EMS that he usually does therapy, but today he didn't. Patient was able to stand and pivot to the EMS stretcher.

## 2019-09-20 NOTE — ED Provider Notes (Signed)
[facility]-EMERG[facility] DEPT Provider Note   CSN: [id number] Arrival date & time: 09/20/19  1428     History Chief Complaint  Patient presents with  . Altered Mental Status  . Generalized Weakness    Donald [name] is a 79 y.o. male.  HPI    Level 5 caveat for severe dementia.  79 year old male with history of dementia, hypertension, diagnosis of COVID-19 on January 4 with admission from 1/4-1/7 and then again between 1/14-1/25 comes in with chief complaint of weakness and change in mental status.  Patient is residing at [facility].  I placed a call at the facility and they informed me that patient is weaker than usual and was unable to participate in physical therapy.  They also think he is more quiet than usual.  Patient has baseline dementia.  I called patient's wife at both the home number and cell phone number and there was no response with either of them.  Patient has no complaints from his side.  Past Medical History:  Diagnosis Date  . Acute kidney failure, unspecified (HCC)   . Acute respiratory failure with hypoxia (HCC)   . Benign localized prostatic hyperplasia without lower urinary tract symptoms (LUTS)   . Clinical diagnosis of COVID-19   . Cognitive communication deficit   . Dementia (HCC)   . Gastroesophageal reflux disease without esophagitis   . Hypertension   . Impaired gait and mobility   . Metabolic encephalopathy   . Muscle weakness (generalized)   . Oropharyngeal dysphagia   . Other lack of coordination   . Other seizures (HCC)   . Pneumonia due to 2019-nCoV   . Septic shock due to undetermined organism (HCC)   . Unspecified hydronephrosis   . Unsteadiness on feet     Patient Active Problem List   Diagnosis Date Noted  . ARF (acute renal failure) (HCC) 08/19/2019  . Near syncope 08/10/2019  . Syncope 08/09/2019  . AKI (acute kidney injury) (HCC) 08/09/2019  . Dementia with behavioral disturbance (HCC)  12/15/2015  . Tonic seizure (HCC) 12/15/2015  . Altered mental status 10/29/2015  . Elevated lactic acid level 10/29/2015  . Encephalopathy acute 10/29/2015  . Enlarged prostate 10/29/2015  . Hypertension 10/29/2015  . 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 NO PAST SURGERIES         Family History  Problem Relation Age of Onset  . Hypertension Mother   . Dementia Sister     Social History   Tobacco Use  . Smoking status: Never Smoker  . Smokeless tobacco: Never Used  Substance Use Topics  . Alcohol use: No    Alcohol/week: 0.0 standard drinks  . Drug use: No    Home Medications Prior to Admission medications   Medication Sig Start Date End Date Taking? Authorizing Provider  acetaminophen (TYLENOL) 325 MG tablet Take 2 tablets (650 mg total) by mouth every 6 (six) hours as needed for mild pain (or Fever >/= 101). 08/30/19  Yes Drema Dallas, MD  albuterol (VENTOLIN HFA) 108 (90 Base) MCG/ACT inhaler Inhale 2 puffs into the lungs every 4 (four) hours as needed for wheezing or shortness of breath. 08/30/19  Yes Drema Dallas, MD  amLODipine (NORVASC) 5 MG tablet Take 1 tablet (5 mg total) by mouth 2 (two) times daily. Patient taking differently: Take 5 mg by mouth daily.  08/30/19  Yes Drema Dallas, MD  clindamycin (  CLEOCIN) 300 MG capsule Take 300 mg by mouth every 6 (six) hours. [id number] Started 09/15/19   Yes [provider]  Lactobacillus (PROBIOTIC ACIDOPHILUS) CAPS Take 1 capsule by mouth 2 (two) times daily. [id number] Started 2.10.21   Yes [provider]  mupirocin ointment (BACTROBAN) 2 % Apply 1 application topically 3 (three) times daily. Apply to Facial Lesions for 10 days. Started 2.10.21   Yes [provider]  NONFORMULARY OR COMPOUNDED ITEM Apply 1 application topically 3 (three) times daily. Triamcinolone cream 0.1% Nystatin Zinc compound mixed 1:1:1 Apply to scrotum  tid x14day started 2.2.2021   Yes [provider]  Nutritional Supplements (FEEDING SUPPLEMENT, NEPRO CARB STEADY,) LIQD Take 237 mLs by mouth 3 (three) times daily as needed (Supplement). 08/30/19  Yes Drema Dallas, MD  pantoprazole (PROTONIX) 40 MG tablet Take 40 mg by mouth daily. 06/02/19  Yes [provider]  QUEtiapine (SEROQUEL) 50 MG tablet Take 1 tablet (50 mg total) by mouth at bedtime. Patient taking differently: Take 25 mg by mouth at bedtime.  08/30/19  Yes Drema Dallas, MD  sorbitol 70 % SOLN Take 30 mLs by mouth as needed for moderate constipation. 08/30/19  Yes Drema Dallas, MD  Amino Acids-Protein Hydrolys (FEEDING SUPPLEMENT, PRO-STAT SUGAR FREE 64,) LIQD Take 30 mLs by mouth 2 (two) times daily. Patient not taking: Reported on 09/20/2019 08/12/19   Lorin Glass, MD  camphor-menthol [facility]) lotion Apply 1 application topically every 8 (eight) hours as needed for itching. Apply to affected area Patient not taking: Reported on 09/20/2019 08/30/19   Drema Dallas, MD  hydrOXYzine (ATARAX/VISTARIL) 25 MG tablet Take 1 tablet (25 mg total) by mouth every 8 (eight) hours as needed for itching. Patient not taking: Reported on 09/20/2019 08/30/19   Drema Dallas, MD  ondansetron (ZOFRAN) 4 MG tablet Take 1 tablet (4 mg total) by mouth every 6 (six) hours as needed for nausea. Patient not taking: Reported on 09/20/2019 08/30/19   Drema Dallas, MD    Allergies    Patient has no known allergies.  Review of Systems   Review of Systems  Unable to perform ROS: Dementia    Physical Exam Updated Vital Signs BP (!) 128/98   Pulse 82   Temp 98.4 F (36.9 C) (Oral)   Resp 16   Ht 5\' 6"  (1.676 m)   Wt 67.7 kg   SpO2 98%   BMI 24.08 kg/m   Physical Exam Vitals and nursing note reviewed.  Constitutional:      Appearance: He is well-developed.  HENT:     Head: Atraumatic.  Eyes:     Pupils: Pupils are equal, round, and reactive to light.  Cardiovascular:      Rate and Rhythm: Normal rate.  Pulmonary:     Effort: Pulmonary effort is normal.  Abdominal:     Tenderness: There is no abdominal tenderness.  Genitourinary:    Comments: Patient has a chronic indwelling Foley catheter Skin:    General: Skin is warm.  Neurological:     Mental Status: He is alert.     Comments: Oriented to self, moving all 4 extremities, gross sensory exam is normal     ED Results / Procedures / Treatments   Labs (all labs ordered are listed, but only abnormal results are displayed) Labs Reviewed  COMPREHENSIVE METABOLIC PANEL - Abnormal; Notable for the following components:      Result Value   Glucose, Bld 159 (*)  BUN 28 (*)    Creatinine, Ser 2.12 (*)    Calcium 8.3 (*)    Albumin 2.6 (*)    GFR calc non Af Amer 29 (*)    GFR calc Af Amer 33 (*)    All other components within normal limits  CBC WITH DIFFERENTIAL/PLATELET - Abnormal; Notable for the following components:   Hemoglobin 12.4 (*)    RDW 15.9 (*)    Platelets 735 (*)    Abs Immature Granulocytes 0.15 (*)    All other components within normal limits  URINALYSIS, ROUTINE W REFLEX MICROSCOPIC - Abnormal; Notable for the following components:   APPearance CLOUDY (*)    Hgb urine dipstick LARGE (*)    Protein, ur 30 (*)    Leukocytes,Ua LARGE (*)    RBC / HPF >50 (*)    WBC, UA >50 (*)    Bacteria, UA RARE (*)    Crystals PRESENT (*)    All other components within normal limits  URINE CULTURE  CREATININE, URINE, RANDOM  SODIUM, URINE, RANDOM    EKG EKG Interpretation  Date/Time:  Monday September 20 2019 15:45:36 EST Ventricular Rate:  92 PR Interval:    QRS Duration: 101 QT Interval:  390 QTC Calculation: 483 R Axis:   19 Text Interpretation: Sinus rhythm Borderline prolonged QT interval No acute changes No significant change since last tracing Confirmed by Varney Biles ([id number]) on 09/20/2019 5:00:08 PM   Radiology DG Chest Port 1 View  Result Date:  09/20/2019 CLINICAL DATA:  79 year old male with weakness, altered mental status. COVID-19 last month. EXAM: PORTABLE CHEST 1 VIEW COMPARISON:  Portable chest 08/22/2019 and earlier. FINDINGS: Portable AP semi upright view at 1842 hours. Stable low lung volumes but improved bilateral ventilation since January. Today Allowing for portable technique the lungs are largely clear. There is mild residual increased interstitial opacity at the left lung base and about the right hilum. Mediastinal contours are within normal limits. Visualized tracheal air column is within normal limits. Negative visible bowel gas pattern. No acute osseous abnormality identified. IMPRESSION: Regressed bilateral pneumonia and improved ventilation since January. Mild residual interstitial opacity. Electronically Signed   By: Genevie Ann M.D.   On: 09/20/2019 19:25    Procedures Procedures (including critical care time)  Medications Ordered in ED Medications  cefTRIAXone (ROCEPHIN) 1 g in sodium chloride 0.9 % 100 mL IVPB (1 g Intravenous New Bag/Given 09/20/19 2041)  lactated ringers bolus 1,000 mL (0 mLs Intravenous Stopped 09/20/19 1805)    ED Course  I have reviewed the triage vital signs and the nursing notes.  Pertinent labs & imaging results that were available during my care of the patient were reviewed by me and considered in my medical decision making (see chart for details).  Clinical Course as of Sep 19 2099  Mon Sep 20, 2019  1941 Patient's UA is pending.  Fena calculation shows prerenal AKI. Patient has received 1 L of LR.  I discussed the case with [facility] staff.  They were made aware of the AKI and weakness in the ER that we have noted.  If patient is medically stable, they are comfortable to accept him back.  They request that we update the discharge instructions.  UA still pending.  We will discharge him once the results come back.   [AN]  2059 Spoke with patient's wife.  She reports that she  was informed that patient was weak on the left side, she was concerned if  her husband had a stroke.  There is no history of stroke  I reassessed the patient.  I do not appreciate any left-sided weakness.  Patient is moving all 4 extremities.  His grip is slightly weaker on the right side, but he is still pretty strong on that side.  Nursing staff had him and try to ambulate him earlier and did not notice any focal weakness.  On patient's chart review it seems like he has had muscle weakness documented as one of the diagnosis.  I doubt that he had an acute stroke event.  He has not been in any treatment window right now.  We will proceed with planned discharge for now with strict ER return precautions.  Leukocytes,Ua(!): LARGE [AN]    Clinical Course User Index [AN] Derwood Kaplan, MD   MDM Rules/Calculators/A&P                      79 year old comes in a chief complaint of increasing weakness and possible change in mental status.  He has history of dementia, recent admission for COVID-19 for which he is in rehab. Patient is moving all 4 extremities.  It is unclear to me what his baseline mental status is because I am not able to get in touch with the wife.  Nursing home reports that he is just weaker than usual.  Given that he had recent diagnosis of COVID-19, we think it is possible that he is having metabolic encephalopathy because of his COVID-19 sequela.  Other possibility we considered was PE, however patient is not tachycardic or tachypneic.  There is also no acute hypoxia.  The other possibilities include electrolyte disturbance, acute renal failure, UTI. We will check basic labs and reassess.  Final Clinical Impression(s) / ED Diagnoses Final diagnoses:  Generalized weakness  AKI (acute kidney injury) [facility])    Rx / DC Orders ED Discharge Orders    None       Derwood Kaplan, MD 09/20/19 2101

## 2019-09-20 NOTE — Discharge Instructions (Addendum)
We had seen Donald [name] in the ER for weakness and worsening confusion.  Our work-up here involves blood work and urine analysis along with chest x-ray. Donald [name] is noted to have acute kidney injury with a bump in his creatinine to 2.12 from 1.36.  It appears that he is mildly dehydrated.  He has received 1 L of IV fluid in the ER.  It is prudent that you ensure he is hydrating himself well.  We recommend repeat blood work in 2 days, which if shows worsening renal function he should be sent back to the ER.  Additionally we have noted that he has bladder infection.  We suspect that that is the underlying cause for his weakness.  Please continue to be aggressive with his physical therapy.  We do not see any signs of one-sided weakness or stroke.  Please have him physically assessed by the medical staff to ensure any new changes can be picked up rapidly by them.  Please return to the ER if the symptoms worsen; there is fevers, chills, inability to keep any medications down, confusion.

## 2019-09-20 NOTE — ED Notes (Signed)
Attempted to call [facility] for report with no answer

## 2019-09-21 DIAGNOSIS — R531 Weakness: Secondary | ICD-10-CM | POA: Diagnosis not present

## 2019-09-21 DIAGNOSIS — N390 Urinary tract infection, site not specified: Secondary | ICD-10-CM | POA: Diagnosis not present

## 2019-09-21 DIAGNOSIS — Z978 Presence of other specified devices: Secondary | ICD-10-CM | POA: Diagnosis not present

## 2019-09-21 DIAGNOSIS — L0103 Bullous impetigo: Secondary | ICD-10-CM | POA: Diagnosis not present

## 2019-09-21 LAB — URINE CULTURE

## 2019-09-22 ENCOUNTER — Other Ambulatory Visit: Payer: Self-pay | Admitting: Family Medicine

## 2019-09-22 DIAGNOSIS — Z978 Presence of other specified devices: Secondary | ICD-10-CM | POA: Diagnosis not present

## 2019-09-22 DIAGNOSIS — R531 Weakness: Secondary | ICD-10-CM

## 2019-09-22 DIAGNOSIS — L0103 Bullous impetigo: Secondary | ICD-10-CM | POA: Diagnosis not present

## 2019-09-22 DIAGNOSIS — I129 Hypertensive chronic kidney disease with stage 1 through stage 4 chronic kidney disease, or unspecified chronic kidney disease: Secondary | ICD-10-CM | POA: Diagnosis not present

## 2019-09-22 DIAGNOSIS — A419 Sepsis, unspecified organism: Secondary | ICD-10-CM | POA: Diagnosis not present

## 2019-09-22 DIAGNOSIS — U071 COVID-19: Secondary | ICD-10-CM | POA: Diagnosis not present

## 2019-09-22 DIAGNOSIS — J1282 Pneumonia due to coronavirus disease 2019: Secondary | ICD-10-CM | POA: Diagnosis not present

## 2019-09-22 DIAGNOSIS — N390 Urinary tract infection, site not specified: Secondary | ICD-10-CM | POA: Diagnosis not present

## 2019-09-22 DIAGNOSIS — F0280 Dementia in other diseases classified elsewhere without behavioral disturbance: Secondary | ICD-10-CM | POA: Diagnosis not present

## 2019-09-23 ENCOUNTER — Other Ambulatory Visit: Payer: Self-pay | Admitting: *Deleted

## 2019-09-23 DIAGNOSIS — I10 Essential (primary) hypertension: Secondary | ICD-10-CM

## 2019-09-23 NOTE — Patient Outreach (Addendum)
Screened for potential [facility] Care Management needs as a benefit of  [employer].  [facility] staff reports member transitioned to home with wife on yesterday 09/22/19 with home health services.   Facility reports member discharged home early due to copays.  Telephone call made to member's wife at [phone]. Patient identifiers confirmed. Mrs. Defraffenreidt states member is doing okay since he has been home. However, she asked writer call her back in 1 hr or so to discuss [facility] follow up. Asked Clinical research associate to call back at [phone].  Will plan to call wife again to discuss [facility] services and [facility].   Raiford Noble, MSN-Ed, RN,BSN [facility] Post Acute Care Coordinator [phone] [facility]) [phone]  (Toll free office)

## 2019-09-23 NOTE — Patient Outreach (Signed)
[facility] Post Acute Care Coordinator follow up.   Telephone call again to member's wife at [phone]. Went into detail about [facility] Care Management services and [facility]. Had to reiterate a number of times that [facility] services nor Remote would not interfere or replace services provided by home health.  Mrs. Gloss states she has home health coming out to the house. Writer explained that member has had frequent hospitalizations and ED visits and would benefit from the additional support of [facility] for home visits and [facility] LCSW due to level of care concerns. Writer did not have the opportunity to discuss palliative follow up to prevent any further confusion. Also [facility] would be beneficial due to very high risk for readmission.   Mrs. Belcher endorses that Dr. Selena Batten is member's PCP at [facility]. However, states Dr. Selena Batten is out of the office and member will follow up with someone else in the practice. States she is going to call to make follow up appointment.   [facility] referral made and will make [facility] Social Worker referral. Will follow up with facility about which home health agency was arranged.   Raiford Noble, MSN-Ed, RN,BSN [facility] Post Acute Care Coordinator [phone] [facility]) [phone]  (Toll free office)

## 2019-09-23 NOTE — Patient Outreach (Signed)
[facility] Post Acute Care Coordinator follow up.  Received phone call from [facility] with [facility] indicating member's wife was adamant that she did not need [facility] services. Stated she did not want anyone else coming to the house.   Explained to Tamela Oddi that wife previously agreed to [facility] services. However, wife is adamant now that she does not need it.    Discussed that writer will make referral to [facility] Care Management and RNCM and [facility] LCSW since it outreach will be telephonic.  Betsy states [facility] will be happy to outreach to schedule visit if wife changes her mind in the future.    Raiford Noble, MSN-Ed, RN,BSN [facility] Post Acute Care Coordinator [phone] [facility]) [phone]  (Toll free office)

## 2019-09-24 ENCOUNTER — Other Ambulatory Visit: Payer: Self-pay | Admitting: *Deleted

## 2019-09-24 DIAGNOSIS — I129 Hypertensive chronic kidney disease with stage 1 through stage 4 chronic kidney disease, or unspecified chronic kidney disease: Secondary | ICD-10-CM | POA: Diagnosis not present

## 2019-09-24 DIAGNOSIS — N179 Acute kidney failure, unspecified: Secondary | ICD-10-CM | POA: Diagnosis not present

## 2019-09-24 DIAGNOSIS — J1282 Pneumonia due to coronavirus disease 2019: Secondary | ICD-10-CM | POA: Diagnosis not present

## 2019-09-24 DIAGNOSIS — U071 COVID-19: Secondary | ICD-10-CM | POA: Diagnosis not present

## 2019-09-24 DIAGNOSIS — N401 Enlarged prostate with lower urinary tract symptoms: Secondary | ICD-10-CM | POA: Diagnosis not present

## 2019-09-24 DIAGNOSIS — Z792 Long term (current) use of antibiotics: Secondary | ICD-10-CM | POA: Diagnosis not present

## 2019-09-24 DIAGNOSIS — G309 Alzheimer's disease, unspecified: Secondary | ICD-10-CM | POA: Diagnosis not present

## 2019-09-24 DIAGNOSIS — G40409 Other generalized epilepsy and epileptic syndromes, not intractable, without status epilepticus: Secondary | ICD-10-CM | POA: Diagnosis not present

## 2019-09-24 DIAGNOSIS — G9341 Metabolic encephalopathy: Secondary | ICD-10-CM | POA: Diagnosis not present

## 2019-09-24 DIAGNOSIS — N189 Chronic kidney disease, unspecified: Secondary | ICD-10-CM | POA: Diagnosis not present

## 2019-09-24 DIAGNOSIS — F0280 Dementia in other diseases classified elsewhere without behavioral disturbance: Secondary | ICD-10-CM | POA: Diagnosis not present

## 2019-09-24 DIAGNOSIS — Z466 Encounter for fitting and adjustment of urinary device: Secondary | ICD-10-CM | POA: Diagnosis not present

## 2019-09-24 DIAGNOSIS — N139 Obstructive and reflux uropathy, unspecified: Secondary | ICD-10-CM | POA: Diagnosis not present

## 2019-09-24 DIAGNOSIS — R1312 Dysphagia, oropharyngeal phase: Secondary | ICD-10-CM | POA: Diagnosis not present

## 2019-09-24 DIAGNOSIS — K219 Gastro-esophageal reflux disease without esophagitis: Secondary | ICD-10-CM | POA: Diagnosis not present

## 2019-09-24 DIAGNOSIS — L0103 Bullous impetigo: Secondary | ICD-10-CM | POA: Diagnosis not present

## 2019-09-24 NOTE — Patient Outreach (Signed)
[facility]) Care Management  09/24/2019  Donald [name] 11-19-1940 [id number]   Referral received from Post Acute Care Coordinator as member was discharged from [facility] on 2/17.  He was admitted to the hospital on 1/14 with Covid 19 infection and discharged to SNF.  He has been seen in the ED twice since being at [facility].  Per chart, he also has history of hypertension, acute renal failure, and dementia.  Call placed to member's caregiver/wife, she report she already had home health come in to visit today.  She is reminded that [facility] will not make home visits and will be able to provide resources telephonically while home health make visits in person.  She verbalizes understanding but state she still does not wish to engage, saying she told [facility] nurse that she would call her if needed.  Benefits of [facility] explained, encouraged to accept this care manager's contact information.  She does agree to receiving successful outreach letter, correct address confirmed.  She also agrees to follow up next week, stating she is so overwhelmed and just need some time to think.    This care manager will send successful letter and follow up with wife within the next week.

## 2019-09-28 ENCOUNTER — Other Ambulatory Visit: Payer: Self-pay | Admitting: *Deleted

## 2019-09-28 DIAGNOSIS — J1282 Pneumonia due to coronavirus disease 2019: Secondary | ICD-10-CM | POA: Diagnosis not present

## 2019-09-28 DIAGNOSIS — Z466 Encounter for fitting and adjustment of urinary device: Secondary | ICD-10-CM | POA: Diagnosis not present

## 2019-09-28 DIAGNOSIS — U071 COVID-19: Secondary | ICD-10-CM | POA: Diagnosis not present

## 2019-09-28 DIAGNOSIS — N401 Enlarged prostate with lower urinary tract symptoms: Secondary | ICD-10-CM | POA: Diagnosis not present

## 2019-09-28 DIAGNOSIS — G9341 Metabolic encephalopathy: Secondary | ICD-10-CM | POA: Diagnosis not present

## 2019-09-28 DIAGNOSIS — N179 Acute kidney failure, unspecified: Secondary | ICD-10-CM | POA: Diagnosis not present

## 2019-09-28 NOTE — Patient Outreach (Signed)
[facility]) Care Management  09/28/2019  Donald [name] Mar 17, 1941 [id number]   CSW made a follow up call to pt's wife this afternoon as previously discussed and offered. Pt's wife was pleasant but declines conversation, stating that she doesn't want to have 2 companies working with them.  CSW reminded her that [facility] is not the same type agency as her home health provider and that we offer different services than HH.  Pt's wife then asked,"like what?" and I began to list our services including RNCM, Pharmacy and SW. Pt's wife quickly stated that they already have a Engineer, civil (consulting) and Child psychotherapist.  CSW concluded call and will update [facility] team to above and await further guidance on their interest and agreement to CSW outreach again.  Reece Levy, MSW, LCSW Clinical Social Worker  [facility] [employer] [phone]

## 2019-09-28 NOTE — Patient Outreach (Signed)
[facility]) Care Management  09/28/2019  Hoke Baer Kochanski 1940-10-20 [id number]   CSW made initial contact with pt's wife today by phone. CSW introduced self and role.  Pt's wife acknowledged speaking with [facility] SNF Liaison, Gerre Scull, RN, yet denied (memory) of speaking to [facility] RNCM, [place].  Pt's wife was then quick to share, "we are going with [facility] home health and they are coming out today".  CSW reminded her that we are not a home health agency.  Wife appeared somewhat overwhelmed with things and said she was awaiting a call from the Doctor.  Pt's wife asked that CSW call back later and "leave a message if I can't answer".  CSW will attempt outreach again later today and will update [facility] team to above.   Reece Levy, MSW, LCSW Clinical Social Worker  [facility] [employer] [phone]

## 2019-09-29 ENCOUNTER — Other Ambulatory Visit: Payer: Medicare Other

## 2019-09-29 ENCOUNTER — Ambulatory Visit: Payer: Self-pay | Admitting: *Deleted

## 2019-09-30 ENCOUNTER — Ambulatory Visit: Payer: Medicare Other | Admitting: *Deleted

## 2019-09-30 DIAGNOSIS — N401 Enlarged prostate with lower urinary tract symptoms: Secondary | ICD-10-CM | POA: Diagnosis not present

## 2019-09-30 DIAGNOSIS — Z466 Encounter for fitting and adjustment of urinary device: Secondary | ICD-10-CM | POA: Diagnosis not present

## 2019-09-30 DIAGNOSIS — G9341 Metabolic encephalopathy: Secondary | ICD-10-CM | POA: Diagnosis not present

## 2019-09-30 DIAGNOSIS — U071 COVID-19: Secondary | ICD-10-CM | POA: Diagnosis not present

## 2019-09-30 DIAGNOSIS — J1282 Pneumonia due to coronavirus disease 2019: Secondary | ICD-10-CM | POA: Diagnosis not present

## 2019-09-30 DIAGNOSIS — N179 Acute kidney failure, unspecified: Secondary | ICD-10-CM | POA: Diagnosis not present

## 2019-10-01 ENCOUNTER — Other Ambulatory Visit: Payer: Self-pay | Admitting: *Deleted

## 2019-10-01 DIAGNOSIS — J1282 Pneumonia due to coronavirus disease 2019: Secondary | ICD-10-CM | POA: Diagnosis not present

## 2019-10-01 DIAGNOSIS — N401 Enlarged prostate with lower urinary tract symptoms: Secondary | ICD-10-CM | POA: Diagnosis not present

## 2019-10-01 DIAGNOSIS — Z466 Encounter for fitting and adjustment of urinary device: Secondary | ICD-10-CM | POA: Diagnosis not present

## 2019-10-01 DIAGNOSIS — N179 Acute kidney failure, unspecified: Secondary | ICD-10-CM | POA: Diagnosis not present

## 2019-10-01 DIAGNOSIS — G9341 Metabolic encephalopathy: Secondary | ICD-10-CM | POA: Diagnosis not present

## 2019-10-01 DIAGNOSIS — U071 COVID-19: Secondary | ICD-10-CM | POA: Diagnosis not present

## 2019-10-01 NOTE — Patient Outreach (Signed)
[facility]) Care Management  10/01/2019  Orlie Cundari Severe [birth year] [id number]   Follow up call placed to member's wife to again offer [facility] services.  She inquires about difference between Home Health and [facility], verbalizes understanding but again state she would like to just stick to home health at this time.  She denies receiving letter from this care manager yet, state they have been having problems with receiving mail on time.  She will look for the letter in the mail and call in the future if she decide to engage.  Will close case at this time.  Kemper Durie, [place], MSN [facility] Care Management  [facility] Manager [phone]

## 2019-10-04 ENCOUNTER — Other Ambulatory Visit: Payer: Self-pay | Admitting: *Deleted

## 2019-10-04 DIAGNOSIS — R0683 Snoring: Secondary | ICD-10-CM | POA: Diagnosis not present

## 2019-10-04 DIAGNOSIS — R2689 Other abnormalities of gait and mobility: Secondary | ICD-10-CM | POA: Diagnosis not present

## 2019-10-04 DIAGNOSIS — F0391 Unspecified dementia with behavioral disturbance: Secondary | ICD-10-CM | POA: Diagnosis not present

## 2019-10-04 DIAGNOSIS — M6281 Muscle weakness (generalized): Secondary | ICD-10-CM | POA: Diagnosis not present

## 2019-10-04 DIAGNOSIS — Z9181 History of falling: Secondary | ICD-10-CM | POA: Diagnosis not present

## 2019-10-04 DIAGNOSIS — R1310 Dysphagia, unspecified: Secondary | ICD-10-CM | POA: Diagnosis not present

## 2019-10-04 DIAGNOSIS — G1229 Other motor neuron disease: Secondary | ICD-10-CM | POA: Diagnosis not present

## 2019-10-04 DIAGNOSIS — R269 Unspecified abnormalities of gait and mobility: Secondary | ICD-10-CM | POA: Diagnosis not present

## 2019-10-04 NOTE — Patient Outreach (Signed)
[facility]) Care Management  10/04/2019  Donald [name] [birth year] [id number]   CSW plans to close case referral given wife's decline of [facility] services per colleague, Maxine Glenn, RN, [facility].  CSW is happy to provide support and assistance if pt/wife decide differently.   CSW will advise PCP of above.   Reece Levy, MSW, LCSW Clinical Social Worker  [facility] [employer] [phone]

## 2019-10-05 DIAGNOSIS — N179 Acute kidney failure, unspecified: Secondary | ICD-10-CM | POA: Diagnosis not present

## 2019-10-05 DIAGNOSIS — N401 Enlarged prostate with lower urinary tract symptoms: Secondary | ICD-10-CM | POA: Diagnosis not present

## 2019-10-05 DIAGNOSIS — U071 COVID-19: Secondary | ICD-10-CM | POA: Diagnosis not present

## 2019-10-05 DIAGNOSIS — Z466 Encounter for fitting and adjustment of urinary device: Secondary | ICD-10-CM | POA: Diagnosis not present

## 2019-10-05 DIAGNOSIS — G9341 Metabolic encephalopathy: Secondary | ICD-10-CM | POA: Diagnosis not present

## 2019-10-05 DIAGNOSIS — J1282 Pneumonia due to coronavirus disease 2019: Secondary | ICD-10-CM | POA: Diagnosis not present

## 2019-10-06 DIAGNOSIS — Z466 Encounter for fitting and adjustment of urinary device: Secondary | ICD-10-CM | POA: Diagnosis not present

## 2019-10-06 DIAGNOSIS — N401 Enlarged prostate with lower urinary tract symptoms: Secondary | ICD-10-CM | POA: Diagnosis not present

## 2019-10-06 DIAGNOSIS — U071 COVID-19: Secondary | ICD-10-CM | POA: Diagnosis not present

## 2019-10-06 DIAGNOSIS — N179 Acute kidney failure, unspecified: Secondary | ICD-10-CM | POA: Diagnosis not present

## 2019-10-06 DIAGNOSIS — J1282 Pneumonia due to coronavirus disease 2019: Secondary | ICD-10-CM | POA: Diagnosis not present

## 2019-10-06 DIAGNOSIS — G9341 Metabolic encephalopathy: Secondary | ICD-10-CM | POA: Diagnosis not present

## 2019-10-07 DIAGNOSIS — N401 Enlarged prostate with lower urinary tract symptoms: Secondary | ICD-10-CM | POA: Diagnosis not present

## 2019-10-07 DIAGNOSIS — U071 COVID-19: Secondary | ICD-10-CM | POA: Diagnosis not present

## 2019-10-07 DIAGNOSIS — Z466 Encounter for fitting and adjustment of urinary device: Secondary | ICD-10-CM | POA: Diagnosis not present

## 2019-10-07 DIAGNOSIS — N179 Acute kidney failure, unspecified: Secondary | ICD-10-CM | POA: Diagnosis not present

## 2019-10-07 DIAGNOSIS — J1282 Pneumonia due to coronavirus disease 2019: Secondary | ICD-10-CM | POA: Diagnosis not present

## 2019-10-07 DIAGNOSIS — G9341 Metabolic encephalopathy: Secondary | ICD-10-CM | POA: Diagnosis not present

## 2019-10-08 DIAGNOSIS — Z466 Encounter for fitting and adjustment of urinary device: Secondary | ICD-10-CM | POA: Diagnosis not present

## 2019-10-08 DIAGNOSIS — N179 Acute kidney failure, unspecified: Secondary | ICD-10-CM | POA: Diagnosis not present

## 2019-10-08 DIAGNOSIS — J1282 Pneumonia due to coronavirus disease 2019: Secondary | ICD-10-CM | POA: Diagnosis not present

## 2019-10-08 DIAGNOSIS — N401 Enlarged prostate with lower urinary tract symptoms: Secondary | ICD-10-CM | POA: Diagnosis not present

## 2019-10-08 DIAGNOSIS — U071 COVID-19: Secondary | ICD-10-CM | POA: Diagnosis not present

## 2019-10-08 DIAGNOSIS — G9341 Metabolic encephalopathy: Secondary | ICD-10-CM | POA: Diagnosis not present

## 2019-10-11 ENCOUNTER — Other Ambulatory Visit: Payer: Self-pay | Admitting: Family Medicine

## 2019-10-12 DIAGNOSIS — Z466 Encounter for fitting and adjustment of urinary device: Secondary | ICD-10-CM | POA: Diagnosis not present

## 2019-10-12 DIAGNOSIS — N179 Acute kidney failure, unspecified: Secondary | ICD-10-CM | POA: Diagnosis not present

## 2019-10-12 DIAGNOSIS — U071 COVID-19: Secondary | ICD-10-CM | POA: Diagnosis not present

## 2019-10-12 DIAGNOSIS — N401 Enlarged prostate with lower urinary tract symptoms: Secondary | ICD-10-CM | POA: Diagnosis not present

## 2019-10-12 DIAGNOSIS — G9341 Metabolic encephalopathy: Secondary | ICD-10-CM | POA: Diagnosis not present

## 2019-10-12 DIAGNOSIS — J1282 Pneumonia due to coronavirus disease 2019: Secondary | ICD-10-CM | POA: Diagnosis not present

## 2019-10-13 DIAGNOSIS — J1282 Pneumonia due to coronavirus disease 2019: Secondary | ICD-10-CM | POA: Diagnosis not present

## 2019-10-13 DIAGNOSIS — N179 Acute kidney failure, unspecified: Secondary | ICD-10-CM | POA: Diagnosis not present

## 2019-10-13 DIAGNOSIS — Z466 Encounter for fitting and adjustment of urinary device: Secondary | ICD-10-CM | POA: Diagnosis not present

## 2019-10-13 DIAGNOSIS — U071 COVID-19: Secondary | ICD-10-CM | POA: Diagnosis not present

## 2019-10-13 DIAGNOSIS — G9341 Metabolic encephalopathy: Secondary | ICD-10-CM | POA: Diagnosis not present

## 2019-10-13 DIAGNOSIS — N401 Enlarged prostate with lower urinary tract symptoms: Secondary | ICD-10-CM | POA: Diagnosis not present

## 2019-10-14 ENCOUNTER — Other Ambulatory Visit: Payer: Self-pay

## 2019-10-14 ENCOUNTER — Emergency Department (HOSPITAL_COMMUNITY): Payer: Medicare Other

## 2019-10-14 ENCOUNTER — Inpatient Hospital Stay (HOSPITAL_COMMUNITY)
Admission: EM | Admit: 2019-10-14 | Discharge: 2019-10-19 | DRG: 698 | Disposition: A | Discharge status: Hospice | Payer: Medicare Other | Attending: Internal Medicine | Admitting: Internal Medicine

## 2019-10-14 ENCOUNTER — Inpatient Hospital Stay: Admission: RE | Admit: 2019-10-14 | Payer: Medicare Other | Source: Ambulatory Visit

## 2019-10-14 ENCOUNTER — Encounter (HOSPITAL_COMMUNITY): Payer: Self-pay

## 2019-10-14 DIAGNOSIS — R823 Hemoglobinuria: Secondary | ICD-10-CM | POA: Diagnosis present

## 2019-10-14 DIAGNOSIS — N390 Urinary tract infection, site not specified: Secondary | ICD-10-CM | POA: Diagnosis present

## 2019-10-14 DIAGNOSIS — D631 Anemia in chronic kidney disease: Secondary | ICD-10-CM | POA: Diagnosis present

## 2019-10-14 DIAGNOSIS — E875 Hyperkalemia: Secondary | ICD-10-CM | POA: Diagnosis present

## 2019-10-14 DIAGNOSIS — L89616 Pressure-induced deep tissue damage of right heel: Secondary | ICD-10-CM | POA: Diagnosis present

## 2019-10-14 DIAGNOSIS — Z6823 Body mass index (BMI) 23.0-23.9, adult: Secondary | ICD-10-CM | POA: Diagnosis not present

## 2019-10-14 DIAGNOSIS — Z515 Encounter for palliative care: Secondary | ICD-10-CM | POA: Diagnosis not present

## 2019-10-14 DIAGNOSIS — N1330 Unspecified hydronephrosis: Secondary | ICD-10-CM | POA: Diagnosis present

## 2019-10-14 DIAGNOSIS — E860 Dehydration: Secondary | ICD-10-CM | POA: Diagnosis present

## 2019-10-14 DIAGNOSIS — L8990 Pressure ulcer of unspecified site, unspecified stage: Secondary | ICD-10-CM | POA: Insufficient documentation

## 2019-10-14 DIAGNOSIS — L89152 Pressure ulcer of sacral region, stage 2: Secondary | ICD-10-CM | POA: Diagnosis present

## 2019-10-14 DIAGNOSIS — I129 Hypertensive chronic kidney disease with stage 1 through stage 4 chronic kidney disease, or unspecified chronic kidney disease: Secondary | ICD-10-CM | POA: Diagnosis present

## 2019-10-14 DIAGNOSIS — N401 Enlarged prostate with lower urinary tract symptoms: Secondary | ICD-10-CM | POA: Diagnosis not present

## 2019-10-14 DIAGNOSIS — E872 Acidosis, unspecified: Secondary | ICD-10-CM | POA: Diagnosis present

## 2019-10-14 DIAGNOSIS — G9341 Metabolic encephalopathy: Secondary | ICD-10-CM | POA: Diagnosis present

## 2019-10-14 DIAGNOSIS — Z8249 Family history of ischemic heart disease and other diseases of the circulatory system: Secondary | ICD-10-CM

## 2019-10-14 DIAGNOSIS — Z7189 Other specified counseling: Secondary | ICD-10-CM | POA: Diagnosis not present

## 2019-10-14 DIAGNOSIS — E870 Hyperosmolality and hypernatremia: Secondary | ICD-10-CM | POA: Diagnosis present

## 2019-10-14 DIAGNOSIS — G309 Alzheimer's disease, unspecified: Secondary | ICD-10-CM | POA: Diagnosis present

## 2019-10-14 DIAGNOSIS — Z66 Do not resuscitate: Secondary | ICD-10-CM | POA: Diagnosis present

## 2019-10-14 DIAGNOSIS — J1282 Pneumonia due to coronavirus disease 2019: Secondary | ICD-10-CM | POA: Diagnosis not present

## 2019-10-14 DIAGNOSIS — K219 Gastro-esophageal reflux disease without esophagitis: Secondary | ICD-10-CM | POA: Diagnosis present

## 2019-10-14 DIAGNOSIS — E43 Unspecified severe protein-calorie malnutrition: Secondary | ICD-10-CM | POA: Diagnosis present

## 2019-10-14 DIAGNOSIS — R32 Unspecified urinary incontinence: Secondary | ICD-10-CM | POA: Diagnosis not present

## 2019-10-14 DIAGNOSIS — R809 Proteinuria, unspecified: Secondary | ICD-10-CM | POA: Diagnosis present

## 2019-10-14 DIAGNOSIS — N179 Acute kidney failure, unspecified: Secondary | ICD-10-CM | POA: Diagnosis present

## 2019-10-14 DIAGNOSIS — I6930 Unspecified sequelae of cerebral infarction: Secondary | ICD-10-CM

## 2019-10-14 DIAGNOSIS — N189 Chronic kidney disease, unspecified: Secondary | ICD-10-CM | POA: Diagnosis not present

## 2019-10-14 DIAGNOSIS — E871 Hypo-osmolality and hyponatremia: Secondary | ICD-10-CM | POA: Diagnosis present

## 2019-10-14 DIAGNOSIS — N1831 Chronic kidney disease, stage 3a: Secondary | ICD-10-CM | POA: Diagnosis present

## 2019-10-14 DIAGNOSIS — R6520 Severe sepsis without septic shock: Secondary | ICD-10-CM | POA: Diagnosis not present

## 2019-10-14 DIAGNOSIS — T83518A Infection and inflammatory reaction due to other urinary catheter, initial encounter: Secondary | ICD-10-CM | POA: Diagnosis present

## 2019-10-14 DIAGNOSIS — Z8616 Personal history of COVID-19: Secondary | ICD-10-CM

## 2019-10-14 DIAGNOSIS — K5900 Constipation, unspecified: Secondary | ICD-10-CM | POA: Diagnosis present

## 2019-10-14 DIAGNOSIS — R404 Transient alteration of awareness: Secondary | ICD-10-CM | POA: Diagnosis not present

## 2019-10-14 DIAGNOSIS — R627 Adult failure to thrive: Secondary | ICD-10-CM | POA: Diagnosis present

## 2019-10-14 DIAGNOSIS — R41841 Cognitive communication deficit: Secondary | ICD-10-CM | POA: Diagnosis present

## 2019-10-14 DIAGNOSIS — F0280 Dementia in other diseases classified elsewhere without behavioral disturbance: Secondary | ICD-10-CM | POA: Diagnosis present

## 2019-10-14 DIAGNOSIS — I10 Essential (primary) hypertension: Secondary | ICD-10-CM | POA: Diagnosis present

## 2019-10-14 DIAGNOSIS — Z8701 Personal history of pneumonia (recurrent): Secondary | ICD-10-CM

## 2019-10-14 DIAGNOSIS — R4182 Altered mental status, unspecified: Secondary | ICD-10-CM | POA: Diagnosis not present

## 2019-10-14 DIAGNOSIS — R1310 Dysphagia, unspecified: Secondary | ICD-10-CM | POA: Diagnosis not present

## 2019-10-14 DIAGNOSIS — A419 Sepsis, unspecified organism: Secondary | ICD-10-CM | POA: Diagnosis not present

## 2019-10-14 DIAGNOSIS — Y846 Urinary catheterization as the cause of abnormal reaction of the patient, or of later complication, without mention of misadventure at the time of the procedure: Secondary | ICD-10-CM | POA: Diagnosis present

## 2019-10-14 DIAGNOSIS — U071 COVID-19: Secondary | ICD-10-CM | POA: Diagnosis not present

## 2019-10-14 DIAGNOSIS — M6281 Muscle weakness (generalized): Secondary | ICD-10-CM | POA: Diagnosis present

## 2019-10-14 DIAGNOSIS — R1312 Dysphagia, oropharyngeal phase: Secondary | ICD-10-CM | POA: Diagnosis present

## 2019-10-14 DIAGNOSIS — Z7401 Bed confinement status: Secondary | ICD-10-CM | POA: Diagnosis not present

## 2019-10-14 DIAGNOSIS — I959 Hypotension, unspecified: Secondary | ICD-10-CM | POA: Diagnosis present

## 2019-10-14 DIAGNOSIS — R5381 Other malaise: Secondary | ICD-10-CM | POA: Diagnosis present

## 2019-10-14 DIAGNOSIS — D649 Anemia, unspecified: Secondary | ICD-10-CM | POA: Diagnosis present

## 2019-10-14 DIAGNOSIS — Z79899 Other long term (current) drug therapy: Secondary | ICD-10-CM

## 2019-10-14 DIAGNOSIS — N400 Enlarged prostate without lower urinary tract symptoms: Secondary | ICD-10-CM | POA: Diagnosis present

## 2019-10-14 DIAGNOSIS — Z960 Presence of urogenital implants: Secondary | ICD-10-CM | POA: Diagnosis not present

## 2019-10-14 DIAGNOSIS — R945 Abnormal results of liver function studies: Secondary | ICD-10-CM | POA: Diagnosis not present

## 2019-10-14 DIAGNOSIS — M2550 Pain in unspecified joint: Secondary | ICD-10-CM | POA: Diagnosis not present

## 2019-10-14 DIAGNOSIS — R000 Tachycardia, unspecified: Secondary | ICD-10-CM | POA: Diagnosis not present

## 2019-10-14 DIAGNOSIS — N281 Cyst of kidney, acquired: Secondary | ICD-10-CM | POA: Diagnosis not present

## 2019-10-14 DIAGNOSIS — R0902 Hypoxemia: Secondary | ICD-10-CM | POA: Diagnosis not present

## 2019-10-14 DIAGNOSIS — J9811 Atelectasis: Secondary | ICD-10-CM | POA: Diagnosis not present

## 2019-10-14 DIAGNOSIS — Z466 Encounter for fitting and adjustment of urinary device: Secondary | ICD-10-CM | POA: Diagnosis not present

## 2019-10-14 LAB — COMPREHENSIVE METABOLIC PANEL
ALT: 85 U/L — ABNORMAL HIGH (ref 0–44)
AST: 95 U/L — ABNORMAL HIGH (ref 15–41)
Albumin: 2.5 g/dL — ABNORMAL LOW (ref 3.5–5.0)
Alkaline Phosphatase: 118 U/L (ref 38–126)
Anion gap: 15 (ref 5–15)
BUN: 106 mg/dL — ABNORMAL HIGH (ref 8–23)
CO2: 24 mmol/L (ref 22–32)
Calcium: 9.2 mg/dL (ref 8.9–10.3)
Chloride: 114 mmol/L — ABNORMAL HIGH (ref 98–111)
Creatinine, Ser: 6.46 mg/dL — ABNORMAL HIGH (ref 0.61–1.24)
GFR calc Af Amer: 9 mL/min — ABNORMAL LOW (ref >60)
GFR calc non Af Amer: 7 mL/min — ABNORMAL LOW (ref >60)
Glucose, Bld: 132 mg/dL — ABNORMAL HIGH (ref 70–99)
Potassium: 6 mmol/L — ABNORMAL HIGH (ref 3.5–5.1)
Sodium: 153 mmol/L — ABNORMAL HIGH (ref 135–145)
Total Bilirubin: 0.5 mg/dL (ref 0.3–1.2)
Total Protein: 8.9 g/dL — ABNORMAL HIGH (ref 6.5–8.1)

## 2019-10-14 LAB — CBC WITH DIFFERENTIAL/PLATELET
Abs Immature Granulocytes: 0.14 10*3/uL — ABNORMAL HIGH (ref 0.00–0.07)
Basophils Absolute: 0.1 10*3/uL (ref 0.0–0.1)
Basophils Relative: 0 %
Eosinophils Absolute: 0.2 10*3/uL (ref 0.0–0.5)
Eosinophils Relative: 1 %
HCT: 40.8 % (ref 39.0–52.0)
Hemoglobin: 12 g/dL — ABNORMAL LOW (ref 13.0–17.0)
Immature Granulocytes: 1 %
Lymphocytes Relative: 6 %
Lymphs Abs: 1.2 10*3/uL (ref 0.7–4.0)
MCH: 25.6 pg — ABNORMAL LOW (ref 26.0–34.0)
MCHC: 29.4 g/dL — ABNORMAL LOW (ref 30.0–36.0)
MCV: 87 fL (ref 80.0–100.0)
Monocytes Absolute: 1.1 10*3/uL — ABNORMAL HIGH (ref 0.1–1.0)
Monocytes Relative: 6 %
Neutro Abs: 17.3 10*3/uL — ABNORMAL HIGH (ref 1.7–7.7)
Neutrophils Relative %: 86 %
Platelets: 472 10*3/uL — ABNORMAL HIGH (ref 150–400)
RBC: 4.69 MIL/uL (ref 4.22–5.81)
RDW: 17.4 % — ABNORMAL HIGH (ref 11.5–15.5)
WBC: 20 10*3/uL — ABNORMAL HIGH (ref 4.0–10.5)
nRBC: 0 % (ref 0.0–0.2)

## 2019-10-14 LAB — URINALYSIS, ROUTINE W REFLEX MICROSCOPIC
Bilirubin Urine: NEGATIVE
Glucose, UA: NEGATIVE mg/dL
Ketones, ur: NEGATIVE mg/dL
Nitrite: POSITIVE — AB
Protein, ur: 100 mg/dL — AB
RBC / HPF: >50 RBC/hpf — ABNORMAL HIGH (ref 0–5)
Specific Gravity, Urine: 1.015 (ref 1.005–1.030)
WBC, UA: >50 WBC/hpf — ABNORMAL HIGH (ref 0–5)
pH: 5 (ref 5.0–8.0)

## 2019-10-14 LAB — PROTIME-INR
INR: 1.4 — ABNORMAL HIGH (ref 0.8–1.2)
Prothrombin Time: 16.8 seconds — ABNORMAL HIGH (ref 11.4–15.2)

## 2019-10-14 LAB — APTT: aPTT: 30 seconds (ref 24–36)

## 2019-10-14 LAB — LACTIC ACID, PLASMA
Lactic Acid, Venous: 5 mmol/L (ref 0.5–1.9)
Lactic Acid, Venous: 1.7 mmol/L (ref 0.5–1.9)

## 2019-10-14 MED ORDER — DEXTROSE 5 % IV SOLN: INTRAVENOUS | Status: DC

## 2019-10-14 MED ORDER — ONDANSETRON HCL 4 MG PO TABS: 4.0000 mg | ORAL_TABLET | Freq: Four times a day (QID) | ORAL | Status: DC | PRN

## 2019-10-14 MED ORDER — SODIUM CHLORIDE 0.9 % IV SOLN
1.0000 g | INTRAVENOUS | Status: DC
  Administered 2019-10-15 – 2019-10-19 (×5): 1 g via INTRAVENOUS
  Filled 2019-10-14 (×4): qty 1
  Filled 2019-10-14: qty 10

## 2019-10-14 MED ORDER — SODIUM CHLORIDE 0.9 % IV BOLUS
1000.0000 mL | Freq: Once | INTRAVENOUS | Status: AC
  Administered 2019-10-14: 1000 mL via INTRAVENOUS

## 2019-10-14 MED ORDER — HEPARIN SODIUM (PORCINE) 5000 UNIT/ML IJ SOLN
5000.0000 [IU] | Freq: Two times a day (BID) | INTRAMUSCULAR | Status: DC
  Administered 2019-10-14 – 2019-10-19 (×10): 5000 [IU] via SUBCUTANEOUS
  Filled 2019-10-14 (×10): qty 1

## 2019-10-14 MED ORDER — ACETAMINOPHEN 650 MG RE SUPP: 650.0000 mg | Freq: Four times a day (QID) | RECTAL | Status: DC | PRN

## 2019-10-14 MED ORDER — ACETAMINOPHEN 325 MG PO TABS: 650.0000 mg | ORAL_TABLET | Freq: Four times a day (QID) | ORAL | Status: DC | PRN

## 2019-10-14 MED ORDER — SODIUM CHLORIDE 0.9 % IV BOLUS
500.0000 mL | Freq: Once | INTRAVENOUS | Status: AC
  Administered 2019-10-14: 500 mL via INTRAVENOUS

## 2019-10-14 MED ORDER — ONDANSETRON HCL 4 MG/2ML IJ SOLN: 4.0000 mg | Freq: Four times a day (QID) | INTRAMUSCULAR | Status: DC | PRN

## 2019-10-14 MED ORDER — SODIUM CHLORIDE 0.9 % IV SOLN
1.0000 g | Freq: Once | INTRAVENOUS | Status: AC
  Administered 2019-10-14: 1 g via INTRAVENOUS
  Filled 2019-10-14: qty 10

## 2019-10-14 NOTE — ED Notes (Addendum)
Attempted IV x 2, unsuccessful.  Left message for wife at 20:47 to notify that patient would be admitted and update on patient's status.

## 2019-10-14 NOTE — ED Provider Notes (Signed)
[place] DEPT Provider Note   CSN: [id number] Arrival date & time: 10/14/19  1222     History Chief Complaint  Patient presents with  . Failure To Thrive    Donald [name] is a 79 y.o. male.  79 year old male brought in by EMS from home, wife at bedside, past medical history of recent diagnosis of COVID-19, prior CVA with right-sided deficits, dementia.  Patient's wife states that patient was working with occupational therapy yesterday, became weak in his legs, sat down on the bed and was found to be hypotensive.  EMS was called, assess patient's blood pressure and recommended patient rest in bed with call back at 5:25 in the evening for EMS to come back out and reassess planned.  EMS rechecked patient last night, blood pressure had improved and patient was not transported at that time, was noted to have "rattling in his left lung."  Patient's wife states that she set patient up in bed today, fed him his regular pured breakfast which he ate well and was getting patient out of bed to take him to a bath when PT arrived.  Patient again had an episode of weakness in his legs and was hypotensive and EMS was called out again.  Patient's wife states that he is breathing fast, is not as alert as usual and is concerned for low blood pressure and leg weakness.        Past Medical History:  Diagnosis Date  . Acute kidney failure, unspecified ([place])   . Acute respiratory failure with hypoxia ([place])   . Benign localized prostatic hyperplasia without lower urinary tract symptoms (LUTS)   . Clinical diagnosis of COVID-19   . Cognitive communication deficit   . Dementia ([place])   . Gastroesophageal reflux disease without esophagitis   . Hypertension   . Impaired gait and mobility   . Metabolic encephalopathy   . Muscle weakness (generalized)   . Oropharyngeal dysphagia   . Other lack of coordination   . Other seizures ([place])   . Pneumonia due to  2019-nCoV   . Septic shock due to undetermined organism ([place])   . Unspecified hydronephrosis   . Unsteadiness on feet     Patient Active Problem List   Diagnosis Date Noted  . Sepsis secondary to UTI ([place]) 10/14/2019  . ARF (acute renal failure) ([place]) 08/19/2019  . Near syncope 08/10/2019  . Syncope 08/09/2019  . AKI (acute kidney injury) ([place]) 08/09/2019  . Dementia with behavioral disturbance ([place]) 12/15/2015  . Tonic seizure ([place]) 12/15/2015  . Altered mental status 10/29/2015  . Elevated lactic acid level 10/29/2015  . Encephalopathy acute 10/29/2015  . Enlarged prostate 10/29/2015  . Hypertension 10/29/2015  . 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 NO PAST SURGERIES         Family History  Problem Relation Age of Onset  . Hypertension Mother   . Dementia Sister     Social History   Tobacco Use  . Smoking status: Never Smoker  . Smokeless tobacco: Never Used  Substance Use Topics  . Alcohol use: No    Alcohol/week: 0.0 standard drinks  . Drug use: No    Home Medications Prior to Admission medications   Medication Sig Start Date End Date Taking? Authorizing Provider  acetaminophen (TYLENOL) 325 MG tablet Take 2 tablets (650 mg total) by mouth every 6 (six) hours as needed for mild pain (or Fever >/=  101). 08/30/19   Drema DallasWoods, Curtis J, MD  albuterol (VENTOLIN HFA) 108 (90 Base) MCG/ACT inhaler Inhale 2 puffs into the lungs every 4 (four) hours as needed for wheezing or shortness of breath. 08/30/19   Drema DallasWoods, Curtis J, MD  Amino Acids-Protein Hydrolys (FEEDING SUPPLEMENT, PRO-STAT SUGAR FREE 64,) LIQD Take 30 mLs by mouth 2 (two) times daily. Patient not taking: Reported on 09/20/2019 08/12/19   Lorin Glassahal, Binaya, MD  amLODipine (NORVASC) 5 MG tablet Take 1 tablet (5 mg total) by mouth 2 (two) times daily. Patient taking differently: Take 5 mg by mouth daily.  08/30/19   Drema DallasWoods, Curtis J, MD  camphor-menthol  [facility](SARNA) lotion Apply 1 application topically every 8 (eight) hours as needed for itching. Apply to affected area Patient not taking: Reported on 09/20/2019 08/30/19   Drema DallasWoods, Curtis J, MD  cephALEXin (KEFLEX) 500 MG capsule Take 1 capsule (500 mg total) by mouth 3 (three) times daily. 09/20/19   Derwood KaplanNanavati, Ankit, MD  clindamycin (CLEOCIN) 300 MG capsule Take 300 mg by mouth every 6 (six) hours. [id number] Started 09/15/19    [provider]  guaiFENesin-codeine 100-10 MG/5ML syrup Take 5 mLs by mouth every 6 (six) hours. 10/06/19   [provider]  hydrOXYzine (ATARAX/VISTARIL) 25 MG tablet Take 1 tablet (25 mg total) by mouth every 8 (eight) hours as needed for itching. Patient not taking: Reported on 09/20/2019 08/30/19   Drema DallasWoods, Curtis J, MD  Lactobacillus (PROBIOTIC ACIDOPHILUS) CAPS Take 1 capsule by mouth 2 (two) times daily. [id number] Started 2.10.21    [provider]  lisinopril (ZESTRIL) 40 MG tablet Take 40 mg by mouth daily. 10/07/19   [provider]  mupirocin ointment (BACTROBAN) 2 % Apply 1 application topically 3 (three) times daily. Apply to Facial Lesions for 10 days. Started 2.10.21    [provider]  NONFORMULARY OR COMPOUNDED ITEM Apply 1 application topically 3 (three) times daily. Triamcinolone cream 0.1% Nystatin Zinc compound mixed 1:1:1 Apply to scrotum tid x14day started 2.2.2021    [provider]  Nutritional Supplements (FEEDING SUPPLEMENT, NEPRO CARB STEADY,) LIQD Take 237 mLs by mouth 3 (three) times daily as needed (Supplement). 08/30/19   Drema DallasWoods, Curtis J, MD  ondansetron (ZOFRAN) 4 MG tablet Take 1 tablet (4 mg total) by mouth every 6 (six) hours as needed for nausea. Patient not taking: Reported on 09/20/2019 08/30/19   Drema DallasWoods, Curtis J, MD  pantoprazole (PROTONIX) 40 MG tablet Take 40 mg by mouth daily. 06/02/19   [provider]  QUEtiapine (SEROQUEL) 50 MG tablet Take 1 tablet (50 mg total) by mouth at bedtime. Patient  taking differently: Take 25 mg by mouth at bedtime.  08/30/19   Drema DallasWoods, Curtis J, MD  sorbitol 70 % SOLN Take 30 mLs by mouth as needed for moderate constipation. 08/30/19   Drema DallasWoods, Curtis J, MD    Allergies    Patient has no known allergies.  Review of Systems   Review of Systems  Unable to perform ROS: Dementia    Physical Exam Updated Vital Signs BP 113/77   Pulse 96   Temp 99.3 F (37.4 C) (Rectal)   Resp 19   SpO2 100%   Physical Exam Vitals and nursing note reviewed.  Constitutional:      General: He is not in acute distress.    Appearance: He is well-developed. He is not diaphoretic.  HENT:     Head: Normocephalic and atraumatic.     Mouth/Throat:     Mouth: Mucous  membranes are dry.  Eyes:     Comments: Holds eyes tightly closed and will not allow for exam.  Cardiovascular:     Rate and Rhythm: Normal rate and regular rhythm.     Heart sounds: Normal heart sounds.  Pulmonary:     Effort: Tachypnea present.     Breath sounds: No decreased breath sounds.  Abdominal:     Tenderness: There is no abdominal tenderness.  Musculoskeletal:     Right lower leg: No edema.     Left lower leg: No edema.  Skin:    General: Skin is warm and dry.     Findings: No erythema or rash.  Neurological:     GCS: GCS eye subscore is 2. GCS verbal subscore is 2. GCS motor subscore is 5.     ED Results / Procedures / Treatments   Labs (all labs ordered are listed, but only abnormal results are displayed) Labs Reviewed  LACTIC ACID, PLASMA - Abnormal; Notable for the following components:      Result Value   Lactic Acid, Venous 5.0 (*)    All other components within normal limits  COMPREHENSIVE METABOLIC PANEL - Abnormal; Notable for the following components:   Sodium 153 (*)    Potassium 6.0 (*)    Chloride 114 (*)    Glucose, Bld 132 (*)    BUN 106 (*)    Creatinine, Ser 6.46 (*)    Total Protein 8.9 (*)    Albumin 2.5 (*)    AST 95 (*)    ALT 85 (*)    GFR calc non Af  Amer 7 (*)    GFR calc Af Amer 9 (*)    All other components within normal limits  CBC WITH DIFFERENTIAL/PLATELET - Abnormal; Notable for the following components:   WBC 20.0 (*)    Hemoglobin 12.0 (*)    MCH 25.6 (*)    MCHC 29.4 (*)    RDW 17.4 (*)    Platelets 472 (*)    Neutro Abs 17.3 (*)    Monocytes Absolute 1.1 (*)    Abs Immature Granulocytes 0.14 (*)    All other components within normal limits  PROTIME-INR - Abnormal; Notable for the following components:   Prothrombin Time 16.8 (*)    INR 1.4 (*)    All other components within normal limits  URINALYSIS, ROUTINE W REFLEX MICROSCOPIC - Abnormal; Notable for the following components:   APPearance CLOUDY (*)    Hgb urine dipstick LARGE (*)    Protein, ur 100 (*)    Nitrite POSITIVE (*)    Leukocytes,Ua LARGE (*)    RBC / HPF >50 (*)    WBC, UA >50 (*)    Bacteria, UA MANY (*)    All other components within normal limits  CULTURE, BLOOD (ROUTINE X 2)  CULTURE, BLOOD (ROUTINE X 2)  URINE CULTURE  APTT  LACTIC ACID, PLASMA    EKG EKG Interpretation  Date/Time:  Thursday October 14 2019 12:32:35 EST Ventricular Rate:  101 PR Interval:    QRS Duration: 85 QT Interval:  327 QTC Calculation: 424 R Axis:   38 Text Interpretation: Sinus tachycardia Since last tracing rate faster Confirmed by Mancel Bale [phone]) on 10/14/2019 1:19:05 PM   Radiology US Renal  Result Date: 10/14/2019 CLINICAL DATA:  Increased creatinine and increased lactic acid. EXAM: RENAL / URINARY TRACT ULTRASOUND COMPLETE COMPARISON:  CT abdomen dated 10/02/2018. FINDINGS: Right Kidney: Renal measurements: 11.4 x 5.1 x 6.1  cm = volume: 186 mL. Multiple cysts. No suspicious mass. Peripelvic cysts demonstrated on earlier CT. No convincing evidence of concomitant hydronephrosis. Left Kidney: Renal measurements: 10.4 x 4.6 cm. Not well visualized. No mass or hydronephrosis identified. Bladder: Not seen, per sonographer, presumably decompressed by Foley  catheter. Other: Study limited by lack of patient cooperation. IMPRESSION: 1. No acute findings. No evidence of hydronephrosis, with study limitations detailed above. 2. Bilateral renal cysts, better demonstrated on earlier CT abdomen of 10/02/2018. Electronically Signed   By: Bary Richard M.D.   On: 10/14/2019 15:20   DG Chest Port 1 View  Result Date: 10/14/2019 CLINICAL DATA:  Weakness. EXAM: PORTABLE CHEST 1 VIEW COMPARISON:  09/20/2019 FINDINGS: Normal sized heart. Tortuous aorta. Mild linear density at the right lung base, increased. Interval minimal linear density at the left lateral lung base. Otherwise, clear lungs with normal vascularity. Poor inspiration. Mild lower thoracic spine degenerative changes. IMPRESSION: Poor inspiration with interval mild right basilar linear atelectasis and minimal left basilar linear atelectasis. Electronically Signed   By: Beckie Salts M.D.   On: 10/14/2019 13:04    Procedures .Critical Care Performed by: Jeannie Fend, PA-C Authorized by: Jeannie Fend, PA-C   Critical care provider statement:    Critical care time (minutes):  45   Critical care was time spent personally by me on the following activities:  Discussions with consultants, evaluation of patient's response to treatment, examination of patient, ordering and performing treatments and interventions, ordering and review of laboratory studies, ordering and review of radiographic studies, pulse oximetry, re-evaluation of patient's condition, obtaining history from patient or surrogate and review of old charts   (including critical care time)  Medications Ordered in ED Medications  sodium chloride 0.9 % bolus 500 mL (0 mLs Intravenous Stopped 10/14/19 1431)  sodium chloride 0.9 % bolus 500 mL (500 mLs Intravenous New Bag/Given 10/14/19 1431)  cefTRIAXone (ROCEPHIN) 1 g in sodium chloride 0.9 % 100 mL IVPB (1 g Intravenous New Bag/Given 10/14/19 1431)  sodium chloride 0.9 % bolus 1,000 mL (1,000  mLs Intravenous New Bag/Given 10/14/19 1438)    ED Course  I have reviewed the triage vital signs and the nursing notes.  Pertinent labs & imaging results that were available during my care of the patient were reviewed by me and considered in my medical decision making (see chart for details).  Clinical Course as of Oct 13 1536  Thu Oct 14, 2019  1415 79yo male brought in by EMS from home for weakness, hypotension, change in mental status. Symptoms started yesterday, improved and then returned today. On exam, patient will occasionally follow a simple command (move your leg), holds eyes tightly closed, mumbles at times. Wife states he does not normally speak (prior CVA and dementia) but will at times speak clearly.  BP at time of exam 87 systolic with respiratory rate in the low 30s. Sepsis orders initiated with followed by additional bolus with improvement in BP.  No fever on rectal temp check, does have an indwelling foley catheter.  CBC with WBC 20 with increase in neutrophils. CMP with Na 153, K 6.0 (no significant EKG changes), Cr 6.46 (increased from 2.1 09/20/19), increase in LFTS- 95/85 (recent diagnosis of COVID, questionably related). INR 1.4. Given Rocephin, CXR without obvious source today, consider possible UTI. No history of recent choking/aspiration concerns.    [LM]  1422 Patient tested positive for COVID on 08/09/2019, admitted for COVID PNA and AKI, was discharged to [facility] before recently returning home.    [LM]  1524 No significant hydronephrosis on renal ultrasound, bladder is decompressed (indwelling Foley catheter).   [LM]  1524 Likely urinary tract infection, large hemoglobin, protein, nitrites, leukocytes, red blood cells, white cells with many bacteria.  Patient is currently on Rocephin, will send urine culture.   [LM]  1529 Patient is now awake, nods occasionally, not verbal at this time. Vitals improved- BP with systolic above 100, respirations even  and unlabored with normal rate. Wife not at bedside at this time for update.    [LM]  1538 Discussed with Dr. Robb Matar with [facility] who will consult for admission.   [LM]    Clinical Course User Index [LM] Alden Hipp   MDM Rules/Calculators/A&P                      Final Clinical Impression(s) / ED Diagnoses Final diagnoses:  Sepsis secondary to UTI (HCC)  Lactic acidosis  AKI (acute kidney injury) (HCC)  Hyperkalemia  Hypernatremia    Rx / DC Orders ED Discharge Orders    None       Jeannie Fend, PA-C 10/14/19 1538    Mancel Bale, MD 10/16/19 1024

## 2019-10-14 NOTE — ED Triage Notes (Signed)
EMS reports from Home, home health care states  normally up with assistance, states not cooperating to ambulate, Pt not eating, drinking well for several weeks. Hx of dementia.  BP 104/70 (124/70 enroute) HR 96 Sp02 95 2ltrs CBG 167 Temp 97.4

## 2019-10-14 NOTE — TOC Initial Note (Addendum)
Transition of Care [facility]) - Initial/Assessment Note    Patient Details  Name: Donald [name] MRN: [id number] Date of Birth: 10-06-1940  Transition of Care [facility]) CM/SW Contact:    Elliot Cousin, RN Phone Number: [phone]  10/14/2019, 2:48 PM  Clinical Narrative:                 TOC CM contacted wife via phone. Wife states pt was able to walk with RW but recently in past couple of days his legs have been like "rubber". States he recently had stay at [facility] rehab after his stroke. Active with [facility] for [facility], PT, OT. Notified [facility] rep, Kathlene November. Pt is currently in program that provide private duty aide a few days per week from [facility] # [phone]. Arranged through his [facility], SW, Joetta Manners # [phone]. States he was going to [facility] prior to his stroke. States the daycare closed during COVID but is currently reopened. Her goal was to get him back to Adult Daycare once his HH was complete and he was up walking again. States he spent his 20 days at [facility] and 3 additional days were she paid copay $190. States pt did not qualify for Medicaid. States she is agreeable to [facility] after his HH is complete. Message sent to [facility] RN following.   Wife, [name] states he had an appt with Urologist today to have catheter removed.   Expected Discharge Plan: Home w Home Health Services Barriers to Discharge: Continued Medical Work up   Patient Goals and CMS Choice Patient states their goals for this hospitalization and ongoing recovery are:: plan to return home with Home Health CMS Medicare.gov Compare Post Acute Care list provided to:: Patient Represenative (must comment)(wife- [facility]) Choice offered to / list presented to : Spouse  Expected Discharge Plan and Services Expected Discharge Plan: Home w Home Health Services In-house Referral: Clinical Social  Work Discharge Planning Services: CM Consult Post Acute Care Choice: Home Health Living arrangements for the past 2 months: Single Family Home                           HH Arranged: RN, PT, OT HH Agency: [facility] Date [facility] Agency Contacted: 10/14/19 Time HH Agency Contacted: [phone] Representative spoke with at [facility] Agency: Driscilla Grammes  Prior Living Arrangements/Services Living arrangements for the past 2 months: Single Family Home Lives with:: Spouse Patient language and need for interpreter reviewed:: Yes Do you feel safe going back to the place where you live?: Yes      Need for Family Participation in Patient Care: Yes (Comment) Care giver support system in place?: Yes (comment) Current home services: DME, Home OT, Home PT, Home RN, Homehealth aide(rolling walker, [facility]) Criminal Activity/Legal Involvement Pertinent to Current Situation/Hospitalization: No - Comment as needed  Activities of Daily Living      Permission Sought/Granted Permission sought to share information with : Case Manager, PCP, Family Supports Permission granted to share information with : Yes, Verbal Permission Granted  Share Information with NAME: Nitin Mckowen  Permission granted to share info w AGENCY: Home Health Agency  Permission granted to share info w Relationship: wife  Permission granted to share info w Contact Information: [phone]  Emotional Assessment Appearance:: Appears stated age Attitude/Demeanor/Rapport: Unable to Assess Affect (typically observed): Unable to Assess Orientation: : (unable to assess)  Psych Involvement: No (comment)  Admission diagnosis:  Failure to Thrive Patient Active Problem List   Diagnosis Date Noted  . ARF (acute renal failure) ([place]) 08/19/2019  . Near syncope 08/10/2019  . Syncope 08/09/2019  . AKI (acute kidney injury) ([place]) 08/09/2019  . Dementia with behavioral disturbance ([place]) 12/15/2015  . Tonic seizure ([place])  12/15/2015  . Altered mental status 10/29/2015  . Elevated lactic acid level 10/29/2015  . Encephalopathy acute 10/29/2015  . Enlarged prostate 10/29/2015  . Hypertension 10/29/2015  . Dementia in Alzheimer's disease ([place]) 05/17/2013   PCP:  Jani Gravel, MD Pharmacy:   [id number] [place], [place] [address] [place] [address] Phone: [phone] Fax: [phone]     Social Determinants of Health (SDOH) Interventions    Readmission Risk Interventions No flowsheet data found.

## 2019-10-14 NOTE — ED Provider Notes (Signed)
  Face-to-face evaluation   History: He presents for evaluation of decreased ambulation, decreased oral intake, both for several weeks.  He cannot give any history secondary to dementia  Physical exam: Elderly frail male.  Eyes closed, does not open to command, but does respond to physical touch by grimacing.  Heart regular rate and rhythm without murmur.  Lungs clear anteriorly.  Abdomen soft and nontender without mass.  Medical screening examination/treatment/procedure(s) were conducted as a shared visit with non-physician practitioner(s) and myself.  I personally evaluated the patient during the encounter    Mancel Bale, MD 10/16/19 1024

## 2019-10-14 NOTE — ED Notes (Signed)
Provider notified of Lactic 5.0

## 2019-10-14 NOTE — H&P (Signed)
History and Physical    Donald [name]:[id number] DOB: Apr 23, 1941 DOA: 10/14/2019  PCP: Donald Grippe, MD   Patient coming from: Home.  I have personally briefly reviewed patient's old medical records in [facility]  Chief Complaint: AMS.  HPI: Donald [name] is a 79 y.o. male with medical history significant of AKI, history of respiratory failure, BPH, Alzheimer's dementia, GERD, hypertension, pneumonia due to COVID-19, history of septic shock, history of hydronephrosis who was brought to the emergency department due to altered mental status.  Per patient's wife, he became hypotensive while working with OT yesterday.  EMS was called, checked the blood pressure and recommended for the patient to rest in bed and came back in the evening.  At that time, the patient's BP had improved and the patient stay home.  His wife stated that she sat the patient up in bed today, fed him his pured breakfast, he ate it was was apparently doing well and went to work with the therapist today again, became weak and hypotensive.  EMS was called and they brought the patient to the emergency department.  ED Course: Temperature 97.9 F, pulse 98, respiration 18, blood pressure 151/117O2 sat 100% on room air.  The patient received 1 g of ceftriaxone and 2000 mL of NS bolus.  His urinalysis show pyuria, hemoglobinuria, proteinuria 100 mg/dL, positive nitrites, large leukocyte esterase with many bacteria.  White count was 20,000 with 86% neutrophils, hemoglobin 12.0 g/dL and platelets 353.  PT was 16.8, INR 1.4 and APTT 30.  Lactic acid was 5.0 initially and 1.7 mmol/L after fluids.  Sodium 153, potassium 6.0, chloride 114 and CO2 24 mmol/L.  Glucose 132, BUN 106 and creatinine 6.46 mg/dL.  Total protein 8.9 and albumin 2.5 g/dL.  AST was 95 and ALT 85.  Imaging: Chest radiograph show poor inspiration with mild right basilar linear atelectasis M minimal left basilar linear atelectasis.  Renal  ultrasound showed no acute findings.  Review of Systems: As per HPI otherwise 10 point review of systems negative.   Past Medical History:  Diagnosis Date  . Acute kidney failure, unspecified (HCC)   . Acute respiratory failure with hypoxia (HCC)   . Benign localized prostatic hyperplasia without lower urinary tract symptoms (LUTS)   . Clinical diagnosis of COVID-19   . Cognitive communication deficit   . Dementia (HCC)   . Gastroesophageal reflux disease without esophagitis   . Hypertension   . Impaired gait and mobility   . Metabolic encephalopathy   . Muscle weakness (generalized)   . Oropharyngeal dysphagia   . Other lack of coordination   . Other seizures (HCC)   . Pneumonia due to 2019-nCoV   . Septic shock due to undetermined organism (HCC)   . Unspecified hydronephrosis   . Unsteadiness on feet     Past Surgical History:  Procedure Laterality Date  . NO PAST SURGERIES       reports that he has never smoked. He has never used smokeless tobacco. He reports that he does not drink alcohol or use drugs.  No Known Allergies  Family History  Problem Relation Age of Onset  . Hypertension Mother   . Dementia Sister    Prior to Admission medications   Medication Sig Start Date End Date Taking? Authorizing Provider  acetaminophen (TYLENOL) 325 MG tablet Take 2 tablets (650 mg total) by mouth every 6 (six) hours as needed for mild pain (or Fever >/= 101). 08/30/19  Yes Drema Dallas,  MD  albuterol (VENTOLIN HFA) 108 (90 Base) MCG/ACT inhaler Inhale 2 puffs into the lungs every 4 (four) hours as needed for wheezing or shortness of breath. 08/30/19  Yes Allie Bossier, MD  amLODipine (NORVASC) 5 MG tablet Take 1 tablet (5 mg total) by mouth 2 (two) times daily. Patient taking differently: Take 10 mg by mouth daily.  08/30/19  Yes Allie Bossier, MD  guaiFENesin-codeine 100-10 MG/5ML syrup Take 5 mLs by mouth every 6 (six) hours. 10/06/19  Yes [provider]    lisinopril (ZESTRIL) 40 MG tablet Take 40 mg by mouth daily. 10/07/19  Yes [provider]  mupirocin ointment (BACTROBAN) 2 % Apply 1 application topically 3 (three) times daily. Apply to Facial Lesions   Yes [provider]  NONFORMULARY OR COMPOUNDED ITEM Apply 1 application topically 3 (three) times daily. Triamcinolone cream 0.1% Nystatin Zinc compound mixed 1:1:1 Apply to scrotum tid   Yes [provider]  Nutritional Supplements (FEEDING SUPPLEMENT, NEPRO CARB STEADY,) LIQD Take 237 mLs by mouth 3 (three) times daily as needed (Supplement). 08/30/19  Yes Allie Bossier, MD  QUEtiapine (SEROQUEL) 50 MG tablet Take 1 tablet (50 mg total) by mouth at bedtime. Patient taking differently: Take 25 mg by mouth at bedtime.  08/30/19  Yes Allie Bossier, MD  sorbitol 70 % SOLN Take 30 mLs by mouth as needed for moderate constipation. 08/30/19  Yes Allie Bossier, MD  Amino Acids-Protein Hydrolys (FEEDING SUPPLEMENT, PRO-STAT SUGAR FREE 64,) LIQD Take 30 mLs by mouth 2 (two) times daily. Patient not taking: Reported on 09/20/2019 08/12/19   Terrilee Croak, MD  camphor-menthol [facility]) lotion Apply 1 application topically every 8 (eight) hours as needed for itching. Apply to affected area Patient not taking: Reported on 09/20/2019 08/30/19   Allie Bossier, MD  cephALEXin (KEFLEX) 500 MG capsule Take 1 capsule (500 mg total) by mouth 3 (three) times daily. Patient not taking: Reported on 10/14/2019 09/20/19   Varney Biles, MD  hydrOXYzine (ATARAX/VISTARIL) 25 MG tablet Take 1 tablet (25 mg total) by mouth every 8 (eight) hours as needed for itching. Patient not taking: Reported on 09/20/2019 08/30/19   Allie Bossier, MD  ondansetron (ZOFRAN) 4 MG tablet Take 1 tablet (4 mg total) by mouth every 6 (six) hours as needed for nausea. Patient not taking: Reported on 09/20/2019 08/30/19   Allie Bossier, MD    Physical Exam: Vitals:   10/14/19 1600 10/14/19 1615 10/14/19 1630 10/14/19  1700  BP: 106/66  (!) 113/54 (!) 121/108  Pulse:  (!) 102 (!) 101 (!) 43  Resp: (!) 21 19 19  (!) 24  Temp:      TempSrc:      SpO2:  100% 100% 100%    Constitutional: NAD, calm, comfortable Eyes: PERRL, lids and conjunctivae normal ENMT: Mucous membranes are very dry. Posterior pharynx clear of any exudate or lesions. Neck: normal, supple, no masses, no thyromegaly Respiratory: Decreased breath sounds in bases, otherwise clear to auscultation bilaterally, no wheezing, no crackles. Normal respiratory effort. No accessory muscle use.  Cardiovascular: Tachycardic at 104 bpm, no murmurs / rubs / gallops. No extremity edema. 2+ pedal pulses. No carotid bruits.  Abdomen: Nondistended.  BS positive.  Soft, no tenderness, no masses palpated. No hepatosplenomegaly. Musculoskeletal: no clubbing / cyanosis. Good ROM, no contractures. Normal muscle tone.  Skin: no significant rashes, lesions, ulcers on very limited dermatological examination. Neurologic: Obtunded, but no acute focalities. Psychiatric: Obtunded.  Labs  on Admission: I have personally reviewed following labs and imaging studies  CBC: Recent Labs  Lab 10/14/19 1321  WBC 20.0*  NEUTROABS 17.3*  HGB 12.0*  HCT 40.8  MCV 87.0  PLT 472*   Basic Metabolic Panel: Recent Labs  Lab 10/14/19 1321  NA 153*  K 6.0*  CL 114*  CO2 24  GLUCOSE 132*  BUN 106*  CREATININE 6.46*  CALCIUM 9.2   GFR: CrCl cannot be calculated (Unknown ideal weight.). Liver Function Tests: Recent Labs  Lab 10/14/19 1321  AST 95*  ALT 85*  ALKPHOS 118  BILITOT 0.5  PROT 8.9*  ALBUMIN 2.5*   No results for input(s): LIPASE, AMYLASE in the last 168 hours. No results for input(s): AMMONIA in the last 168 hours. Coagulation Profile: Recent Labs  Lab 10/14/19 1321  INR 1.4*   Cardiac Enzymes: No results for input(s): CKTOTAL, CKMB, CKMBINDEX, TROPONINI in the last 168 hours. BNP (last 3 results) No results for input(s): PROBNP in the  last 8760 hours. HbA1C: No results for input(s): HGBA1C in the last 72 hours. CBG: No results for input(s): GLUCAP in the last 168 hours. Lipid Profile: No results for input(s): CHOL, HDL, LDLCALC, TRIG, CHOLHDL, LDLDIRECT in the last 72 hours. Thyroid Function Tests: No results for input(s): TSH, T4TOTAL, FREET4, T3FREE, THYROIDAB in the last 72 hours. Anemia Panel: No results for input(s): VITAMINB12, FOLATE, FERRITIN, TIBC, IRON, RETICCTPCT in the last 72 hours. Urine analysis:    Component Value Date/Time   COLORURINE YELLOW 10/14/2019 1448   APPEARANCEUR CLOUDY (A) 10/14/2019 1448   LABSPEC 1.015 10/14/2019 1448   PHURINE 5.0 10/14/2019 1448   GLUCOSEU NEGATIVE 10/14/2019 1448   HGBUR LARGE (A) 10/14/2019 1448   BILIRUBINUR NEGATIVE 10/14/2019 1448   KETONESUR NEGATIVE 10/14/2019 1448   PROTEINUR 100 (A) 10/14/2019 1448   UROBILINOGEN 0.2 02/02/2014 1804   NITRITE POSITIVE (A) 10/14/2019 1448   LEUKOCYTESUR LARGE (A) 10/14/2019 1448    Radiological Exams on Admission: US Renal  Result Date: 10/14/2019 CLINICAL DATA:  Increased creatinine and increased lactic acid. EXAM: RENAL / URINARY TRACT ULTRASOUND COMPLETE COMPARISON:  CT abdomen dated 10/02/2018. FINDINGS: Right Kidney: Renal measurements: 11.4 x 5.1 x 6.1 cm = volume: 186 mL. Multiple cysts. No suspicious mass. Peripelvic cysts demonstrated on earlier CT. No convincing evidence of concomitant hydronephrosis. Left Kidney: Renal measurements: 10.4 x 4.6 cm. Not well visualized. No mass or hydronephrosis identified. Bladder: Not seen, per sonographer, presumably decompressed by Foley catheter. Other: Study limited by lack of patient cooperation. IMPRESSION: 1. No acute findings. No evidence of hydronephrosis, with study limitations detailed above. 2. Bilateral renal cysts, better demonstrated on earlier CT abdomen of 10/02/2018. Electronically Signed   By: Bary Richard M.D.   On: 10/14/2019 15:20   DG Chest Port 1  View  Result Date: 10/14/2019 CLINICAL DATA:  Weakness. EXAM: PORTABLE CHEST 1 VIEW COMPARISON:  09/20/2019 FINDINGS: Normal sized heart. Tortuous aorta. Mild linear density at the right lung base, increased. Interval minimal linear density at the left lateral lung base. Otherwise, clear lungs with normal vascularity. Poor inspiration. Mild lower thoracic spine degenerative changes. IMPRESSION: Poor inspiration with interval mild right basilar linear atelectasis and minimal left basilar linear atelectasis. Electronically Signed   By: Beckie Salts M.D.   On: 10/14/2019 13:04   08/10/2019 echo  IMPRESSIONS   1. Left ventricular ejection fraction, by visual estimation, is 60 to  65%. The left ventricle has normal function. Left ventricular septal wall  thickness  was mildly increased. There is no left ventricular hypertrophy.  2. Left ventricular diastolic parameters are consistent with Grade II  diastolic dysfunction (pseudonormalization).  3. Global right ventricle has mildly reduced systolic function.The right  ventricular size is normal. No increase in right ventricular wall  thickness.  4. Left atrial size was normal.  5. Right atrial size was normal.  6. The mitral valve is normal in structure. Mild to moderate mitral valve  regurgitation. No evidence of mitral stenosis.  7. The tricuspid valve is normal in structure.  8. The aortic valve is normal in structure. Aortic valve regurgitation is  mild. No evidence of aortic valve sclerosis or stenosis.  9. The pulmonic valve was grossly normal. Pulmonic valve regurgitation is  trivial.  10. TR signal is inadequate for assessing pulmonary artery systolic  pressure.   EKG: Independently reviewed. Vent. rate 101 BPM PR interval * ms QRS duration 85 ms QT/QTc 327/424 ms P-R-T axes 65 38 60 Sinus tachycardia  Assessment/Plan Principal Problem:   Sepsis secondary to UTI present on admission [facility]) Admit to  stepdown/inpatient. Continue IV fluids. Continue ceftriaxone 1 g IVPB every 24 hours. Follow CBC and CMP in a.m. Follow-up blood culture and sensitivity. Follow-up urine culture and sensitivity.  Active Problems:   AKI (acute kidney injury) (HCC) Secondary to dehydration and sepsis. Hold lisinopril. Continue IV hydration. Follow-up renal function electrolytes. Monitor intake and output.    Hypernatremia Continue D5W at 150 mL/h. Follow-up sodium level.    Hyperkalemia Hold lisinopril. Received fluid resuscitation. Continue D5W infusion. Follow-up potassium level.    Lactic acidosis Resolved after hydration.    Anemia (normocytic) Monitor hematocrit and hemoglobin.    Dementia in Alzheimer's disease (HCC) Supportive care.    Hypertension Hold lisinopril. Hold amlodipine. Monitor BP.   DVT prophylaxis: Heparin SQ. Code Status: DNR. Family Communication: Disposition Plan: Admit to stepdown for sepsis, AKI and hypernatremia treatment. Consults called: Admission status: Inpatient/stepdown.   Bobette Mo MD [facility]  If 7PM-7AM, please contact night-coverage [email or web address]  10/14/2019, 6:04 PM   This document was prepared using Dragon voice recognition software and may contain some unintended transcription errors.

## 2019-10-15 DIAGNOSIS — F0280 Dementia in other diseases classified elsewhere without behavioral disturbance: Secondary | ICD-10-CM

## 2019-10-15 DIAGNOSIS — G309 Alzheimer's disease, unspecified: Secondary | ICD-10-CM

## 2019-10-15 DIAGNOSIS — E875 Hyperkalemia: Secondary | ICD-10-CM

## 2019-10-15 DIAGNOSIS — N179 Acute kidney failure, unspecified: Secondary | ICD-10-CM

## 2019-10-15 LAB — URINE CULTURE

## 2019-10-15 LAB — BASIC METABOLIC PANEL
Anion gap: 16 — ABNORMAL HIGH (ref 5–15)
BUN: 98 mg/dL — ABNORMAL HIGH (ref 8–23)
CO2: 21 mmol/L — ABNORMAL LOW (ref 22–32)
Calcium: 8.6 mg/dL — ABNORMAL LOW (ref 8.9–10.3)
Chloride: 113 mmol/L — ABNORMAL HIGH (ref 98–111)
Creatinine, Ser: 6.25 mg/dL — ABNORMAL HIGH (ref 0.61–1.24)
GFR calc Af Amer: 9 mL/min — ABNORMAL LOW (ref >60)
GFR calc non Af Amer: 8 mL/min — ABNORMAL LOW (ref >60)
Glucose, Bld: 237 mg/dL — ABNORMAL HIGH (ref 70–99)
Potassium: 6.1 mmol/L — ABNORMAL HIGH (ref 3.5–5.1)
Sodium: 150 mmol/L — ABNORMAL HIGH (ref 135–145)

## 2019-10-15 LAB — COMPREHENSIVE METABOLIC PANEL
ALT: 72 U/L — ABNORMAL HIGH (ref 0–44)
AST: 77 U/L — ABNORMAL HIGH (ref 15–41)
Albumin: 2.1 g/dL — ABNORMAL LOW (ref 3.5–5.0)
Alkaline Phosphatase: 99 U/L (ref 38–126)
Anion gap: 14 (ref 5–15)
BUN: 98 mg/dL — ABNORMAL HIGH (ref 8–23)
CO2: 21 mmol/L — ABNORMAL LOW (ref 22–32)
Calcium: 8.2 mg/dL — ABNORMAL LOW (ref 8.9–10.3)
Chloride: 112 mmol/L — ABNORMAL HIGH (ref 98–111)
Creatinine, Ser: 5.92 mg/dL — ABNORMAL HIGH (ref 0.61–1.24)
GFR calc Af Amer: 10 mL/min — ABNORMAL LOW (ref >60)
GFR calc non Af Amer: 8 mL/min — ABNORMAL LOW (ref >60)
Glucose, Bld: 156 mg/dL — ABNORMAL HIGH (ref 70–99)
Potassium: 5.6 mmol/L — ABNORMAL HIGH (ref 3.5–5.1)
Sodium: 147 mmol/L — ABNORMAL HIGH (ref 135–145)
Total Bilirubin: 0.4 mg/dL (ref 0.3–1.2)
Total Protein: 7.5 g/dL (ref 6.5–8.1)

## 2019-10-15 LAB — CBC WITH DIFFERENTIAL/PLATELET
Abs Immature Granulocytes: 0.15 10*3/uL — ABNORMAL HIGH (ref 0.00–0.07)
Basophils Absolute: 0.1 10*3/uL (ref 0.0–0.1)
Basophils Relative: 0 %
Eosinophils Absolute: 0.1 10*3/uL (ref 0.0–0.5)
Eosinophils Relative: 0 %
HCT: 36.4 % — ABNORMAL LOW (ref 39.0–52.0)
Hemoglobin: 10.4 g/dL — ABNORMAL LOW (ref 13.0–17.0)
Immature Granulocytes: 1 %
Lymphocytes Relative: 5 %
Lymphs Abs: 1.1 10*3/uL (ref 0.7–4.0)
MCH: 24.9 pg — ABNORMAL LOW (ref 26.0–34.0)
MCHC: 28.6 g/dL — ABNORMAL LOW (ref 30.0–36.0)
MCV: 87.1 fL (ref 80.0–100.0)
Monocytes Absolute: 1.1 10*3/uL — ABNORMAL HIGH (ref 0.1–1.0)
Monocytes Relative: 5 %
Neutro Abs: 18.6 10*3/uL — ABNORMAL HIGH (ref 1.7–7.7)
Neutrophils Relative %: 89 %
Platelets: 422 10*3/uL — ABNORMAL HIGH (ref 150–400)
RBC: 4.18 MIL/uL — ABNORMAL LOW (ref 4.22–5.81)
RDW: 17.5 % — ABNORMAL HIGH (ref 11.5–15.5)
WBC: 21.1 10*3/uL — ABNORMAL HIGH (ref 4.0–10.5)
nRBC: 0 % (ref 0.0–0.2)

## 2019-10-15 LAB — BASIC METABOLIC PANEL WITH GFR
Anion gap: 13 (ref 5–15)
BUN: 85 mg/dL — ABNORMAL HIGH (ref 8–23)
CO2: 23 mmol/L (ref 22–32)
Calcium: 8.2 mg/dL — ABNORMAL LOW (ref 8.9–10.3)
Chloride: 113 mmol/L — ABNORMAL HIGH (ref 98–111)
Creatinine, Ser: 5.47 mg/dL — ABNORMAL HIGH (ref 0.61–1.24)
GFR calc Af Amer: 11 mL/min — ABNORMAL LOW
GFR calc non Af Amer: 9 mL/min — ABNORMAL LOW
Glucose, Bld: 131 mg/dL — ABNORMAL HIGH (ref 70–99)
Potassium: 5.2 mmol/L — ABNORMAL HIGH (ref 3.5–5.1)
Sodium: 149 mmol/L — ABNORMAL HIGH (ref 135–145)

## 2019-10-15 LAB — MRSA PCR SCREENING: MRSA by PCR: NEGATIVE

## 2019-10-15 MED ORDER — SODIUM CHLORIDE 0.9% FLUSH: 10.0000 mL | INTRAVENOUS | Status: DC | PRN

## 2019-10-15 MED ORDER — SODIUM ZIRCONIUM CYCLOSILICATE 10 G PO PACK
10.0000 g | PACK | Freq: Two times a day (BID) | ORAL | Status: DC
  Administered 2019-10-15: 10 g via ORAL
  Filled 2019-10-15: qty 1

## 2019-10-15 MED ORDER — SODIUM ZIRCONIUM CYCLOSILICATE 10 G PO PACK
10.0000 g | PACK | Freq: Two times a day (BID) | ORAL | Status: DC
  Filled 2019-10-15: qty 1

## 2019-10-15 MED ORDER — SODIUM POLYSTYRENE SULFONATE 15 GM/60ML PO SUSP
30.0000 g | Freq: Once | ORAL | Status: AC
  Administered 2019-10-15: 30 g via RECTAL
  Filled 2019-10-15: qty 120

## 2019-10-15 MED ORDER — SODIUM ZIRCONIUM CYCLOSILICATE 10 G PO PACK
10.0000 g | PACK | Freq: Once | ORAL | Status: AC
  Administered 2019-10-15: 10 g via ORAL
  Filled 2019-10-15: qty 1

## 2019-10-15 MED ORDER — SODIUM CHLORIDE 0.9% FLUSH
10.0000 mL | Freq: Two times a day (BID) | INTRAVENOUS | Status: DC
  Administered 2019-10-15 – 2019-10-18 (×4): 10 mL

## 2019-10-15 MED ORDER — ORAL CARE MOUTH RINSE
15.0000 mL | Freq: Two times a day (BID) | OROMUCOSAL | Status: DC
  Administered 2019-10-15 – 2019-10-19 (×8): 15 mL via OROMUCOSAL

## 2019-10-15 MED ORDER — CHLORHEXIDINE GLUCONATE CLOTH 2 % EX PADS
6.0000 | MEDICATED_PAD | Freq: Every day | CUTANEOUS | Status: DC
  Administered 2019-10-16 – 2019-10-19 (×4): 6 via TOPICAL

## 2019-10-15 NOTE — ED Notes (Signed)
Patient ate 75% of his meal tray, total assist.

## 2019-10-15 NOTE — Progress Notes (Addendum)
PROGRESS NOTE   Donald [name]  KVQ:[id number]    DOB: [birth year]    DOA: 10/14/2019  PCP: Jani Gravel, MD   I have briefly reviewed patients previous medical records in [facility].  Chief Complaint:   Chief Complaint  Patient presents with  . Failure To Thrive    Brief Narrative:  79 year old married male, PMH of advanced Alzheimer's dementia, BPH, indwelling Foley catheter, GERD, HTN, dysphagia on pured diet, prior acute kidney injury, hydronephrosis, COVID-19 and acute kidney injury, presented to the [facility] ED on 10/14/2019 due to altered mental status, hypotension and weakness. He was admitted for sepsis due to complicated catheter associated UTI, ongoing urinary retention, dehydration with hypernatremia, acute kidney injury, hyperkalemia and lactic acidosis.   Assessment & Plan:  Principal Problem:   Sepsis secondary to UTI [facility]) Active Problems:   Dementia in Alzheimer's disease ([place])   Hypertension   AKI (acute kidney injury) ([place])   Hypernatremia   Lactic acidosis   Anemia   Hyperkalemia   Sepsis due to indwelling Foley catheter associated UTI: Present on admission. Drop of pus noted at tip of penis. Treated per sepsis protocol with IV fluids and IV ceftriaxone. Sepsis physiology has resolved. Blood cultures x2: Negative to date.  Dehydration with hypernatremia: Suspect due to advanced dementia and poor oral intake. Treating with hypotonic/IV D5 infusion. Serum sodium has improved from 153 on admission to 147. Continue IV fluids and aim to correct serum sodium by no greater than 10 mEq in a 24-hour.. Follow BMP this evening and daily.  Acute kidney injury complicating stage IIIa CKD: Multifactorial due to dehydration, ACEI and urinary retention. Held ACEI. Continue IV fluids. Creatinine has improved from 6.46-5.92. Follow BMP closely. Treat urinary retention as below. Renal ultrasound showed no acute findings and no evidence of hydronephrosis.  Baseline creatinine probably in the 1.3 range.  Hyperkalemia: Held lisinopril. No acute EKG changes. Unwilling to take oral Lokelma. Hence ordered a dose of Kayexalate 30 g enema x1. Potassium has improved from 6 on admission to 5.6. Follow BMP closely.  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 Despite catheter, patient's bladder scan shows 531 mL and palpable suprapubic bladder. If flushing the catheter doesn't get it to work then will need to change the Foley catheter and if have difficulty then may need to consult urology.  Addendum: Confirmed with ICU RN that Foley catheter is functioning fine at this time.  However given how long the catheter has been in place, plan to change it in the morning tomorrow and if difficulty with that then can consult urology in the daytime.  Also requested RN for repeat labs.  Lactic acidosis: Secondary to sepsis and dehydration. Resolved after IV fluids.  Anemia: Unspecified. Hemoglobin dropped from 12-10.4 in the absence of overt bleeding. May be dilutional. Follow CBC in a.m.  Leukocytosis: Likely due to sepsis and stress response. Follow CBC daily.  Advanced dementia with acute metabolic encephalopathy: Acute encephalopathy likely due to acute illness as noted above. Patient is nonverbal and does not follow instructions. May not be far from the baseline as per RN. Monitor.  Dysphagia: Reportedly on pured diet at home. Here refusing oral intake. Monitor and initiate when able.  Essential hypertension: Currently controlled off of antihypertensives. Holding lisinopril and amlodipine.  Adult failure to thrive: May consider palliative care consultation if he doesn't improve   Body mass index is 23.27 kg/m.   DVT prophylaxis:  Subcutaneous heparin Code Status: DNR Family Communication: None at bedside.  I discussed in detail with patient spouse via phone, updated care and answered questions.   She is unsure when the Foley catheter was actually placed.  She indicates that he returned from SNF with that on 2/26. Disposition:  . Patient came from: Home           . Anticipated d/c place: Likely home with family . Barriers to d/c: Acute kidney injury, hyperkalemia and sepsis   Consultants:   None  Procedures:   Has indwelling Foley catheter from PTA  Antimicrobials:   IV ceftriaxone   Subjective:  Patient is nonverbal, unable to get any history. As per RN, mental status likely at baseline and no acute issues noted. Also refused Lokelma this morning.  Objective:   Vitals:   10/15/19 0408 10/15/19 0600 10/15/19 0731 10/15/19 0800  BP: (!) 139/57 123/67 106/71 133/70  Pulse: 90  98 97  Resp: (!) 21 (!) 24 18 (!) 26  Temp: 97.8 F (36.6 C)     TempSrc: Axillary     SpO2: 93% 98% 97% 93%  Weight: 65.4 kg       General exam: Elderly male, moderately built, frail and chronically ill looking lying comfortably propped up in bed. Oral mucosa moist. Skin turgor decreased. Respiratory system: Poor inspiratory effort but seems clear to auscultation. No increased work of breathing. Cardiovascular system: S1 & S2 heard, RRR. No JVD, murmurs, rubs, gallops or clicks. No pedal edema. Telemetry personally reviewed: Sinus rhythm. Gastrointestinal system: Abdomen is nondistended, soft. Mild suprapubic tenderness with palpable bladder. No other organomegaly or masses felt. Normal bowel sounds heard. GU: Indwelling Foley catheter with small straw colored urine in tube. A drop of pus at tip of penis. Central nervous system: Alert, non verbal, tracks activity with eyes. No focal neurological deficits. Extremities: Moves all limbs symmetrically Skin: No rashes, lesions or ulcers. Dry and flaky skin. Psychiatry: Judgement and insight impaired. Mood & affect appropriate.     Data Reviewed:   I have personally reviewed following labs and imaging studies   CBC: Recent Labs  Lab  11/03/19 1321 10/15/19 0440  WBC 20.0* 21.1*  NEUTROABS 17.3* 18.6*  HGB 12.0* 10.4*  HCT 40.8 36.4*  MCV 87.0 87.1  PLT 472* 422*    Basic Metabolic Panel: Recent Labs  Lab November 03, 2019 1321 10/15/19 0015 10/15/19 0440  NA 153* 150* 147*  K 6.0* 6.1* 5.6*  CL 114* 113* 112*  CO2 24 21* 21*  GLUCOSE 132* 237* 156*  BUN 106* 98* 98*  CREATININE 6.46* 6.25* 5.92*  CALCIUM 9.2 8.6* 8.2*    Liver Function Tests: Recent Labs  Lab November 03, 2019 1321 10/15/19 0440  AST 95* 77*  ALT 85* 72*  ALKPHOS 118 99  BILITOT 0.5 0.4  PROT 8.9* 7.5  ALBUMIN 2.5* 2.1*    CBG: No results for input(s): GLUCAP in the last 168 hours.  Microbiology Studies:   Recent Results (from the past 240 hour(s))  Blood Culture (routine x 2)     Status: None (Preliminary result)   Collection Time: 11-03-2019  1:21 PM   Specimen: BLOOD  Result Value Ref Range Status   Specimen Description   Final    BLOOD LEFT ANTECUBITAL Performed at [facility], [address] [place], [place] [address]    Special Requests   Final    BOTTLES DRAWN AEROBIC AND ANAEROBIC Blood Culture adequate volume Performed at [facility],  [address], [place], [place] [address]    Culture   Final    NO GROWTH < 24 HOURS Performed at [facility], [address], [place], [place] [address]    Report Status PENDING  Incomplete  Blood Culture (routine x 2)     Status: None (Preliminary result)   Collection Time: 10-21-19  1:37 PM   Specimen: BLOOD  Result Value Ref Range Status   Specimen Description   Final    BLOOD RIGHT ANTECUBITAL Performed at [facility], [address], [place], [place] [address]    Special Requests   Final    BOTTLES DRAWN AEROBIC AND ANAEROBIC Blood Culture adequate volume Performed at [facility], [address], [place], [place] [address]    Culture   Final    NO GROWTH < 24 HOURS Performed at [facility], [address] [place], [place] [address]    Report Status PENDING  Incomplete     Radiology Studies:  US Renal  Result Date: 10/21/2019 CLINICAL DATA:  Increased creatinine and increased lactic acid. EXAM: RENAL / URINARY TRACT ULTRASOUND COMPLETE COMPARISON:  CT abdomen dated 10/02/2018. FINDINGS: Right Kidney: Renal measurements: 11.4 x 5.1 x 6.1 cm = volume: 186 mL. Multiple cysts. No suspicious mass. Peripelvic cysts demonstrated on earlier CT. No convincing evidence of concomitant hydronephrosis. Left Kidney: Renal measurements: 10.4 x 4.6 cm. Not well visualized. No mass or hydronephrosis identified. Bladder: Not seen, per sonographer, presumably decompressed by Foley catheter. Other: Study limited by lack of patient cooperation. IMPRESSION: 1. No acute findings. No evidence of hydronephrosis, with study limitations detailed above. 2. Bilateral renal cysts, better demonstrated on earlier CT abdomen of 10/02/2018. Electronically Signed   By: Bary Richard M.D.   On: 10-21-2019 15:20   DG Chest Port 1 View  Result Date: October 21, 2019 CLINICAL DATA:  Weakness. EXAM: PORTABLE CHEST 1 VIEW COMPARISON:  09/20/2019 FINDINGS: Normal sized heart. Tortuous aorta. Mild linear density at the right lung base, increased. Interval minimal linear density at the left lateral lung base. Otherwise, clear lungs with normal vascularity. Poor inspiration. Mild lower thoracic spine degenerative changes. IMPRESSION: Poor inspiration with interval mild right basilar linear atelectasis and minimal left basilar linear atelectasis. Electronically Signed   By: Beckie Salts M.D.   On: 2019-10-21 13:04     Scheduled Meds:   . heparin  5,000 Units Subcutaneous Q12H  . sodium chloride flush  10-40 mL Intracatheter Q12H    Continuous Infusions:   . cefTRIAXone (ROCEPHIN)  IV    . dextrose 150 mL/hr at 10/15/19 0549     LOS: 1 day     Marcellus Scott, MD, [place], [facility]. [facility]    To contact  the attending provider between 7A-7P or the covering provider during after hours 7P-7A, please log into the web site [email or web address] and access using universal [place] password for that web site. If you do not have the password, please call the hospital operator.  10/15/2019, 9:25 AM

## 2019-10-15 NOTE — ED Notes (Signed)
[name] Dengel wife would like an update on the patient from the doctor, [phone].

## 2019-10-16 DIAGNOSIS — L8990 Pressure ulcer of unspecified site, unspecified stage: Secondary | ICD-10-CM | POA: Insufficient documentation

## 2019-10-16 LAB — CBC WITH DIFFERENTIAL/PLATELET
Abs Immature Granulocytes: 0.1 10*3/uL — ABNORMAL HIGH (ref 0.00–0.07)
Basophils Absolute: 0.1 10*3/uL (ref 0.0–0.1)
Basophils Relative: 0 %
Eosinophils Absolute: 0.2 10*3/uL (ref 0.0–0.5)
Eosinophils Relative: 1 %
HCT: 37.2 % — ABNORMAL LOW (ref 39.0–52.0)
Hemoglobin: 10.6 g/dL — ABNORMAL LOW (ref 13.0–17.0)
Immature Granulocytes: 1 %
Lymphocytes Relative: 7 %
Lymphs Abs: 1.2 10*3/uL (ref 0.7–4.0)
MCH: 24.9 pg — ABNORMAL LOW (ref 26.0–34.0)
MCHC: 28.5 g/dL — ABNORMAL LOW (ref 30.0–36.0)
MCV: 87.5 fL (ref 80.0–100.0)
Monocytes Absolute: 0.9 10*3/uL (ref 0.1–1.0)
Monocytes Relative: 5 %
Neutro Abs: 15.8 10*3/uL — ABNORMAL HIGH (ref 1.7–7.7)
Neutrophils Relative %: 86 %
Platelets: 424 10*3/uL — ABNORMAL HIGH (ref 150–400)
RBC: 4.25 MIL/uL (ref 4.22–5.81)
RDW: 17.7 % — ABNORMAL HIGH (ref 11.5–15.5)
WBC: 18.2 10*3/uL — ABNORMAL HIGH (ref 4.0–10.5)
nRBC: 0 % (ref 0.0–0.2)

## 2019-10-16 LAB — BASIC METABOLIC PANEL
Anion gap: 13 (ref 5–15)
BUN: 82 mg/dL — ABNORMAL HIGH (ref 8–23)
CO2: 20 mmol/L — ABNORMAL LOW (ref 22–32)
Calcium: 8 mg/dL — ABNORMAL LOW (ref 8.9–10.3)
Chloride: 105 mmol/L (ref 98–111)
Creatinine, Ser: 3.92 mg/dL — ABNORMAL HIGH (ref 0.61–1.24)
GFR calc Af Amer: 16 mL/min — ABNORMAL LOW (ref >60)
GFR calc non Af Amer: 14 mL/min — ABNORMAL LOW (ref >60)
Glucose, Bld: 127 mg/dL — ABNORMAL HIGH (ref 70–99)
Potassium: 4.5 mmol/L (ref 3.5–5.1)
Sodium: 138 mmol/L (ref 135–145)

## 2019-10-16 LAB — COMPREHENSIVE METABOLIC PANEL
ALT: 63 U/L — ABNORMAL HIGH (ref 0–44)
AST: 68 U/L — ABNORMAL HIGH (ref 15–41)
Albumin: 2.1 g/dL — ABNORMAL LOW (ref 3.5–5.0)
Alkaline Phosphatase: 99 U/L (ref 38–126)
Anion gap: 15 (ref 5–15)
BUN: 98 mg/dL — ABNORMAL HIGH (ref 8–23)
CO2: 21 mmol/L — ABNORMAL LOW (ref 22–32)
Calcium: 8.3 mg/dL — ABNORMAL LOW (ref 8.9–10.3)
Chloride: 109 mmol/L (ref 98–111)
Creatinine, Ser: 4.88 mg/dL — ABNORMAL HIGH (ref 0.61–1.24)
GFR calc Af Amer: 12 mL/min — ABNORMAL LOW (ref >60)
GFR calc non Af Amer: 10 mL/min — ABNORMAL LOW (ref >60)
Glucose, Bld: 134 mg/dL — ABNORMAL HIGH (ref 70–99)
Potassium: 5 mmol/L (ref 3.5–5.1)
Sodium: 145 mmol/L (ref 135–145)
Total Bilirubin: 0.4 mg/dL (ref 0.3–1.2)
Total Protein: 7.7 g/dL (ref 6.5–8.1)

## 2019-10-16 MED ORDER — PRO-STAT SUGAR FREE PO LIQD
30.0000 mL | Freq: Two times a day (BID) | ORAL | Status: DC
  Administered 2019-10-16 – 2019-10-18 (×5): 30 mL via ORAL
  Filled 2019-10-16 (×6): qty 30

## 2019-10-16 MED ORDER — ENSURE ENLIVE PO LIQD
237.0000 mL | Freq: Two times a day (BID) | ORAL | Status: DC
  Administered 2019-10-18 (×2): 237 mL via ORAL

## 2019-10-16 MED ORDER — PROSIGHT PO TABS
1.0000 | ORAL_TABLET | Freq: Every day | ORAL | Status: DC
  Administered 2019-10-17 – 2019-10-19 (×3): 1 via ORAL
  Filled 2019-10-16 (×3): qty 1

## 2019-10-16 NOTE — Progress Notes (Signed)
Writer updated patients wife [name] [phone]) on plan of care and new orders to transfer out of Step down unit to room 1603.

## 2019-10-16 NOTE — Progress Notes (Signed)
Initial Nutrition Assessment  DOCUMENTATION CODES:   Not applicable  INTERVENTION:  Ensure Enlive po BID, each supplement provides 350 kcal and 20 grams of protein  Magic cup TID with meals, each supplement provides 290 kcal and 9 grams of protein  Pro-stat 30 ml po BID, each supplement provides 100 kcal and 15 grams of protein  Ocuvite daily, MVI provides 200 mg vit C, 40 mg zinc, 2 mg copper, 2 mg lutein, 55 mcg selenium to support wound healing  Monitor for GOC and need for nutrition support   NUTRITION DIAGNOSIS:   Inadequate oral intake related to acute illness, lethargy/confusion(sepsis secondary to UTI; Alzheimer's dementia) as evidenced by meal completion < 50%, percent weight loss.  GOAL:   Patient will meet greater than or equal to 90% of their needs   MONITOR:   PO intake, Weight trends, Supplement acceptance, Labs, I & O's, Skin  REASON FOR ASSESSMENT:   Malnutrition Screening Tool, Consult Assessment of nutrition requirement/status  ASSESSMENT:  RD working remotely.  79 year old male with past medical history significant of AKI, history of respiratory failure, BPH, Alzheimer's dementia, dysphagia on puree diet, GERD, HTN, pneumonia due to COVID-19, history of septic shock, history of hydronephrosis who was brought to the ED due to altered mental status.  Patient admitted on 3/11 for sepsis secondary to complicated catheter associated UTI.  Per notes: -Foley catheter exchanged today -purulent discharge at penis, urine milky brown-yellow color with sediments, cultured -AKI improving -hypernatermia gradually improving, on D5 -multiple wounds present on admission, WOC consult -PMT consult for failure to thrive -SLP eval pending  Unable to obtain nutrition history, per chart patient is nonverbal and not interacting. He is tolerating diet, but not eating much, noted 25% x 1 documented meal on 3/12. Will provide Magic Cup and Ensure supplements to aid with  estimated needs and liquid protein supplement and MVI to support wound healing.  Current wt 143.88 lbs Per history, pt wt on 1/06 71 kg (156.2 lbs), on 1/25 pt wt 71.7 kg (157.74 lbs), on 2/15 pt wt 67.7 kg (148.94 lbs) This indicates 12.32 lb (7.9%) wt loss in 3 months which is severe for time frame.  Given patient history of Alzheimer's dementia, weight history, multiple wounds present on admission, highly suspect malnutrition, however unable to identify at this time without completion of NFPE.  I/Os: +3638 ml since admit     +1412 ml x 24 hrs UOP: 950 ml x 24 hrs  Medications reviewed and include: Rocephin D5 @150  ml/hr providing 612 kcal  Labs: BG 134,131,156, BUN 98 (H), Cr 4.88 (H), Albumin 2.1 (L), Corrected Ca 9.82 (WNL), WBC 18.2 (H), Hgb 10.6 (L)  NUTRITION - FOCUSED PHYSICAL EXAM: Unable to complete at this time, RD working remotely.  Diet Order:   Diet Order            DIET - DYS 1 Room service appropriate? Yes; Fluid consistency: Thin  Diet effective now              EDUCATION NEEDS:   No education needs have been identified at this time  Skin:  Skin Assessment: Skin Integrity Issues: Skin Integrity Issues:: DTI, Stage II DTI: R heel Stage II: sacrum; scrotum  Last BM:  3/12  Height:   Ht Readings from Last 1 Encounters:  10/15/19 5\' 6"  (1.676 m)    Weight:   Wt Readings from Last 1 Encounters:  10/15/19 65.4 kg    Ideal Body Weight:  64.5 kg  BMI:  Body mass index is 23.27 kg/m.  Estimated Nutritional Needs:   Kcal:  2000-2200  Protein:  100-110  Fluid:  >/= 1.6 L/day   Lajuan Lines, RD, LDN Clinical Nutrition After Hours/Weekend Pager # in [place]

## 2019-10-16 NOTE — Evaluation (Signed)
Physical Therapy Evaluation Patient Details Name: Donald [name] MRN: [id number] DOB: [birth year] Today's Date: 10/16/2019   History of Present Illness  Pt is 79 y.o. male with medical history significant of hypertension, dementia, CVA and oropharyngeal dysphagia and now admitted with sepsis, ARF and syncope.    Clinical Impression  Pt admitted as above and presenting with functional mobility limitations 2* generalized weakness, balance deficits and dementia related cognitive deficits.  Pt is currently requiring significant assist of two to perform basic mobility tasks and would benefit from follow up rehab at SNF level to maximize IND and safety.    Follow Up Recommendations SNF    Equipment Recommendations  None recommended by PT    Recommendations for Other Services       Precautions / Restrictions Precautions Precautions: Fall Restrictions Weight Bearing Restrictions: No      Mobility  Bed Mobility Overal bed mobility: Needs Assistance Bed Mobility: Supine to Sit;Sit to Supine     Supine to sit: Max assist;+2 for physical assistance;+2 for safety/equipment Sit to supine: Total assist;+2 for safety/equipment;+2 for physical assistance   General bed mobility comments: significant physical assist and use of pad  Transfers Overall transfer level: Needs assistance Equipment used: 2 person hand held assist Transfers: Sit to/from Stand Sit to Stand: Max assist;+2 physical assistance;+2 safety/equipment;From elevated surface         General transfer comment: multimodal cues and significant physical assist to bring wt up and fwd but pt able to bear wt in standing but with poor balance  Ambulation/Gait             General Gait Details: Pt stood x 2 with significant assist but unable to initiate step with either LE  Stairs            Wheelchair Mobility    Modified Rankin (Stroke Patients Only)       Balance Overall balance assessment: Needs  assistance Sitting-balance support: No upper extremity supported;Feet supported Sitting balance-Leahy Scale: Fair       Standing balance-Leahy Scale: Zero                               Pertinent Vitals/Pain Pain Assessment: Faces Pain Score: 0-No pain    Home Living Family/patient expects to be discharged to:: Unsure Living Arrangements: Spouse/significant other                    Prior Function Level of Independence: Needs assistance   Gait / Transfers Assistance Needed: Per ED note, pt ambulated with assist but no AD until recently  ADL's / Homemaking Assistance Needed: Wife assisting with all ADL; Per chart pt not eating or drinking last several weeks        Hand Dominance        Extremity/Trunk Assessment   Upper Extremity Assessment Upper Extremity Assessment: Difficult to assess due to impaired cognition    Lower Extremity Assessment Lower Extremity Assessment: Difficult to assess due to impaired cognition;RLE deficits/detail;LLE deficits/detail RLE Deficits / Details: Pt holding bilat knees at ~ 35 flex and initially appearing contracted but with increased time, pt able to extend bilat knees fully       Communication      Cognition Arousal/Alertness: Lethargic(but rousable) Behavior During Therapy: Flat affect Overall Cognitive Status: History of cognitive impairments - at baseline  General Comments: Pt following min verbal cues but intermittently assisting with movement if initiated for him      General Comments      Exercises     Assessment/Plan    PT Assessment Patient needs continued PT services  PT Problem List Decreased strength;Decreased range of motion;Decreased activity tolerance;Decreased balance;Decreased mobility;Decreased cognition;Decreased knowledge of use of DME;Decreased safety awareness       PT Treatment Interventions DME instruction;Gait training;Functional  mobility training;Therapeutic activities;Therapeutic exercise;Balance training;Cognitive remediation;Patient/family education    PT Goals (Current goals can be found in the Care Plan section)  Acute Rehab PT Goals Patient Stated Goal: No goals stated PT Goal Formulation: Patient unable to participate in goal setting Time For Goal Achievement: 10/30/19 Potential to Achieve Goals: Fair    Frequency Min 3X/week   Barriers to discharge        Co-evaluation               AM-PAC PT "6 Clicks" Mobility  Outcome Measure Help needed turning from your back to your side while in a flat bed without using bedrails?: A Lot Help needed moving from lying on your back to sitting on the side of a flat bed without using bedrails?: A Lot Help needed moving to and from a bed to a chair (including a wheelchair)?: Total Help needed standing up from a chair using your arms (e.g., wheelchair or bedside chair)?: A Lot Help needed to walk in hospital room?: Total Help needed climbing 3-5 steps with a railing? : Total 6 Click Score: 9    End of Session Equipment Utilized During Treatment: Gait belt Activity Tolerance: Patient limited by fatigue Patient left: in bed;with call bell/phone within reach;with bed alarm set;with nursing/sitter in room Nurse Communication: Mobility status PT Visit Diagnosis: Difficulty in walking, not elsewhere classified (R26.2);Muscle weakness (generalized) (M62.81)    Time: 5170-0174 PT Time Calculation (min) (ACUTE ONLY): 25 min   Charges:   PT Evaluation $PT Eval Moderate Complexity: 1 Mod PT Treatments $Therapeutic Activity: 8-22 mins        Debe Coder PT Acute Rehabilitation Services Pager [phone] Office [phone]   Marlena Barbato 10/16/2019, 1:21 PM

## 2019-10-16 NOTE — Progress Notes (Signed)
PROGRESS NOTE   Donald [name]  WUJ:[id number]    DOB: 1941-03-04    DOA: 10/14/2019  PCP: Pearson Grippe, MD   I have briefly reviewed patients previous medical records in [facility].  Chief Complaint:   Chief Complaint  Patient presents with  . Failure To Thrive    Brief Narrative:  79 year old married male, PMH of advanced Alzheimer's dementia, BPH, indwelling Foley catheter, GERD, HTN, dysphagia on pured diet, prior acute kidney injury, hydronephrosis, COVID-19 and acute kidney injury, presented to the [facility] ED on 10/14/2019 due to altered mental status, hypotension and weakness. He was admitted for sepsis due to complicated catheter associated UTI, ongoing urinary retention, dehydration with hypernatremia, acute kidney injury, hyperkalemia and lactic acidosis.  Slowly improving.  Hyperkalemia resolved.  AKI improving.  Foley catheter changed 3/13.  Transferred from stepdown to medical bed 3/13.   Assessment & Plan:  Principal Problem:   Sepsis secondary to UTI [facility]) Active Problems:   Dementia in Alzheimer's disease (HCC)   Hypertension   AKI (acute kidney injury) (HCC)   Hypernatremia   Lactic acidosis   Anemia   Hyperkalemia   Pressure injury of skin   Sepsis due to indwelling Foley catheter associated UTI: Present on admission. Drop of pus noted at tip of penis. Treated per sepsis protocol with IV fluids and IV ceftriaxone. Sepsis physiology has resolved. Blood cultures x2: Negative to date.  Initial urine culture was not helpful: Multiple species, possibly contaminant.  At urinary catheter change 3/13, noted purulent discharge at penis and urine milky brown-yellow color with sediments-recent urine culture 3/13.  Dehydration with hypernatremia: Suspect due to advanced dementia and poor oral intake.  Serum sodium has improved from 153 on admission. Continue IV fluids and aim to correct serum sodium by no greater than 10 mEq in a 24-hour.   Gradually improving.  Continue IV D5 infusion.  Serum sodium has decreased from 153 on 3/11-145.  Follow BMP closely.  Acute kidney injury complicating stage IIIa CKD: Multifactorial due to dehydration, ACEI and urinary retention. Held ACEI. Continue IV fluids.  Creatinine on admission 6.46. Had urinary retention on admission despite Foley catheter which was blocked.. Renal ultrasound showed no acute findings and no evidence of hydronephrosis. Baseline creatinine probably in the 1.3 range.  Foley catheter changed 3/13.  Creatinine gradually improving, down to 4.88 today.  Continue IV fluids.  Hyperkalemia: Held lisinopril. No acute EKG changes.  Potassium 6 on admission.  Resolved after a dose of Kayexalate enema and a dose of Lokelma on 3/12.  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 Despite catheter, patient's bladder scan shows 531 mL and palpable suprapubic bladder on 3/12.  Catheter was flushed and working yesterday but given duration of old catheter, change Foley catheter 3/13.  Needs outpatient follow-up with urology.  Lactic acidosis: Secondary to sepsis and dehydration. Resolved after IV fluids.  Anemia: Unspecified. Hemoglobin dropped from 12-10.4 in the absence of overt bleeding.  Stable.  Leukocytosis: Likely due to sepsis and stress response. Follow CBC daily.  Slowly improving.  Advanced dementia with acute metabolic encephalopathy: Acute encephalopathy likely due to acute illness as noted above. Patient is nonverbal and does not follow instructions. May not be far from the baseline as per RN. Monitor.  Dysphagia: Reportedly on pured diet at home.  Tolerating diet but not eating much.  Will get swallow evaluation by ST.  Essential hypertension: Currently controlled off of antihypertensives.  Holding lisinopril and amlodipine.  Adult failure to thrive: PMT consulted for goals of care  Multiple wounds: Present on  admission.  Has superficial wound on scrotum, unstageable wound on right heel and mid sacral clean wound in gluteal cleft without acute findings.  WOC RN consulted.  Body mass index is 23.27 kg/m.   Abnormal LFTs: Unclear etiology.  Mild transaminitis, stable.  Severe protein calorie malnutrition: RD consulted.   DVT prophylaxis: Subcutaneous heparin Code Status: DNR Family Communication: None at bedside.  I discussed in detail with patient spouse via phone on 3/12, updated care and answered questions.  She is unsure when the Foley catheter was actually placed.  She indicates that he returned from SNF with that on 2/26. Disposition:  . Patient came from: Home           . Anticipated d/c place: PT recommends SNF but will need to discuss with spouse. . Barriers to d/c: Acute kidney injury, UTI sepsis.  Transferred from stepdown to medical bed on 3/13.   Consultants:   None  Procedures:   Has indwelling Foley catheter from PTA, changed on 3/13  Antimicrobials:   IV ceftriaxone   Subjective:  Nonverbal.  Not interacting.  As per RN, no acute issues noted.  Examined patient with RN.  Objective:   Vitals:   10/16/19 0500 10/16/19 0600 10/16/19 0700 10/16/19 0800  BP: (!) 110/44 101/72 119/60   Pulse: 92 94 94   Resp: 17 (!) 23 16   Temp:    98.6 F (37 C)  TempSrc:    Axillary  SpO2: 100% 100% 100%   Weight:      Height:        General exam: Elderly male, moderately built, frail and chronically ill looking lying comfortably propped up in bed. Oral mucosa with borderline hydration. Respiratory system: Poor inspiratory effort but seems clear to auscultation.  No increased work of breathing. Cardiovascular system: S1 and S2 heard, RRR.  No JVD, murmurs or pedal edema.  Telemetry personally reviewed: Sinus rhythm. Gastrointestinal system: Abdomen is nondistended, soft and nontender. No organomegaly or masses felt. Normal bowel sounds heard. GU: Indwelling urinary  catheter Central nervous system: Alert, non verbal, tracks activity with eyes. No focal neurological deficits. Extremities: Moves all limbs symmetrically Skin: Dry and flaky skin. Has superficial wound on scrotum, unstageable wound on right heel and mid sacral clean wound in gluteal cleft without acute findings Psychiatry: Judgement and insight impaired. Mood & affect cannot be assessed    Data Reviewed:   I have personally reviewed following labs and imaging studies   CBC: Recent Labs  Lab 10/14/19 1321 10/15/19 0440 10/16/19 0218  WBC 20.0* 21.1* 18.2*  NEUTROABS 17.3* 18.6* 15.8*  HGB 12.0* 10.4* 10.6*  HCT 40.8 36.4* 37.2*  MCV 87.0 87.1 87.5  PLT 472* 422* 424*    Basic Metabolic Panel: Recent Labs  Lab 10/15/19 0440 10/15/19 1500 10/16/19 0218  NA 147* 149* 145  K 5.6* 5.2* 5.0  CL 112* 113* 109  CO2 21* 23 21*  GLUCOSE 156* 131* 134*  BUN 98* 85* 98*  CREATININE 5.92* 5.47* 4.88*  CALCIUM 8.2* 8.2* 8.3*    Liver Function Tests: Recent Labs  Lab 10/14/19 1321 10/15/19 0440 10/16/19 0218  AST 95* 77* 68*  ALT 85* 72* 63*  ALKPHOS 118 99 99  BILITOT 0.5 0.4 0.4  PROT 8.9* 7.5 7.7  ALBUMIN 2.5* 2.1* 2.1*    CBG: No results for input(s): GLUCAP in the  last 168 hours.  Microbiology Studies:   Recent Results (from the past 240 hour(s))  Blood Culture (routine x 2)     Status: None (Preliminary result)   Collection Time: 10/14/19  1:21 PM   Specimen: BLOOD  Result Value Ref Range Status   Specimen Description   Final    BLOOD LEFT ANTECUBITAL Performed at [facility] [address], [place], [place] [address]    Special Requests   Final    BOTTLES DRAWN AEROBIC AND ANAEROBIC Blood Culture adequate volume Performed at [facility] [address], [place], [place] [address]    Culture   Final    NO GROWTH 2 DAYS Performed at [facility] [address] [place], [place] [address]    Report  Status PENDING  Incomplete  Blood Culture (routine x 2)     Status: None (Preliminary result)   Collection Time: 10/14/19  1:37 PM   Specimen: BLOOD  Result Value Ref Range Status   Specimen Description   Final    BLOOD RIGHT ANTECUBITAL Performed at [facility] [address], [place], [place] [address]    Special Requests   Final    BOTTLES DRAWN AEROBIC AND ANAEROBIC Blood Culture adequate volume Performed at [facility] [address], [place], [place] [address]    Culture   Final    NO GROWTH 2 DAYS Performed at [facility] [address], [place], [place] [address]    Report Status PENDING  Incomplete  Urine culture     Status: Abnormal   Collection Time: 10/14/19  2:48 PM   Specimen: In/Out Cath Urine  Result Value Ref Range Status   Specimen Description   Final    IN/OUT CATH URINE Performed at [facility] [address], [place], [place] [address]    Special Requests   Final    NONE Performed at [facility], [place] [address] [place], [place] [address]    Culture MULTIPLE SPECIES PRESENT, SUGGEST RECOLLECTION (A)  Final   Report Status 10/15/2019 FINAL  Final  MRSA PCR Screening     Status: None   Collection Time: 10/15/19  4:37 PM   Specimen: Nasal Mucosa; Nasopharyngeal  Result Value Ref Range Status   MRSA by PCR NEGATIVE NEGATIVE Final    Comment:        The GeneXpert MRSA Assay (FDA approved for NASAL specimens only), is one component of a comprehensive MRSA colonization surveillance program. It is not intended to diagnose MRSA infection nor to guide or monitor treatment for MRSA infections. Performed at [facility], [place] [address] [place], [place] [address]      Radiology Studies:  No results found.   Scheduled Meds:   . Chlorhexidine Gluconate Cloth  6 each Topical Daily  . heparin  5,000 Units Subcutaneous Q12H  . mouth rinse  15 mL  Mouth Rinse BID  . sodium chloride flush  10-40 mL Intracatheter Q12H    Continuous Infusions:   . cefTRIAXone (ROCEPHIN)  IV 1 g (10/16/19 1420)  . dextrose 150 mL/hr at 10/16/19 1409     LOS: 2 days     Vernell Leep, MD, [place], [facility]. [facility]    To contact the attending provider between 7A-7P or the covering provider during after hours 7P-7A, please log into the web site [email or web address] and access using universal [place] password for that web site.  If you do not have the password, please call the hospital operator.  10/16/2019, 3:05 PM

## 2019-10-16 NOTE — Progress Notes (Signed)
Palliative Care Consult Note  Reason for consult: GOC in light of advance dementia with continued functional decline  Palliative care consult received. chart reviewed including personal review of pertinent labs and imaging.  Discussed with bedside care team.  Briefly, Mr. Ishler is an 79 year old male past medical history of advanced Alzheimer's dementia, BPH, indwelling Foley catheter, GERD, hypertension, dysphagia prior kidney injury, hydronephrosis with recent COVID-19 infection who presented to [facility] long ED on status, hypotension and weakness likely secondary to sepsis related to UTI with indwelling catheter.  He is nonverbal and limited in interaction.  I called and spoke with his wife today.  We discussed clinical course over the past couple of months and significant decline in his nutrition and functional status since COVID diagnosis.  Discussed wishes moving forward in regard to advanced directives.  We discussed difference between a aggressive medical intervention path and a palliative, comfort focused care path.  Values and goals of care important to patient and family were attempted to be elicited.  [name] and I discussed that the hospital can be useful as long as he is getting well enough from care he receives at the hospital to enjoy his time at home, but there is going to come a time in the near future where, if his goal is to be at home, he may be better served to plan on being at home and bringing care to him at home rather repeated trips to the hospital. We discussed hospice as a tool that may be beneficial in this goal when he reaches a point where we are trying to fix problems that are not fixable.  She is in agreement that a good plan would be to plan to take him home with home health as they have previously been arranging. He has done well with rehabbing in the past. If he does well at home and continues to thrive, I encouraged they continue with this plan. If, however he  is unable to regain function and he continues to decline, I recommended that she speak with his PCP to determine if he may be better served by focusing his care on staying at home with support of organization such as hospice.  -DNR/DNI - Family is invested in plan to return home with only home health services on discharge.  If wife agreed, I believe he would be well served by engagement with [facility] and also Care Connections program through [facility] for [facility] eligible patients.  Total time: 60  Greater than 50%  of this time was spent counseling and coordinating care related to the above assessment and plan.  Romie Minus, MD [facility] Palliative Medicine Team [phone]

## 2019-10-17 DIAGNOSIS — Z515 Encounter for palliative care: Secondary | ICD-10-CM

## 2019-10-17 DIAGNOSIS — Z7189 Other specified counseling: Secondary | ICD-10-CM

## 2019-10-17 LAB — CBC WITH DIFFERENTIAL/PLATELET
Abs Immature Granulocytes: 0.11 10*3/uL — ABNORMAL HIGH (ref 0.00–0.07)
Basophils Absolute: 0.1 10*3/uL (ref 0.0–0.1)
Basophils Relative: 0 %
Eosinophils Absolute: 0.2 10*3/uL (ref 0.0–0.5)
Eosinophils Relative: 1 %
HCT: 32.7 % — ABNORMAL LOW (ref 39.0–52.0)
Hemoglobin: 9.7 g/dL — ABNORMAL LOW (ref 13.0–17.0)
Immature Granulocytes: 1 %
Lymphocytes Relative: 8 %
Lymphs Abs: 1.2 10*3/uL (ref 0.7–4.0)
MCH: 25.3 pg — ABNORMAL LOW (ref 26.0–34.0)
MCHC: 29.7 g/dL — ABNORMAL LOW (ref 30.0–36.0)
MCV: 85.2 fL (ref 80.0–100.0)
Monocytes Absolute: 0.7 10*3/uL (ref 0.1–1.0)
Monocytes Relative: 5 %
Neutro Abs: 12.5 10*3/uL — ABNORMAL HIGH (ref 1.7–7.7)
Neutrophils Relative %: 85 %
Platelets: 463 10*3/uL — ABNORMAL HIGH (ref 150–400)
RBC: 3.84 MIL/uL — ABNORMAL LOW (ref 4.22–5.81)
RDW: 17.2 % — ABNORMAL HIGH (ref 11.5–15.5)
WBC: 14.8 10*3/uL — ABNORMAL HIGH (ref 4.0–10.5)
nRBC: 0 % (ref 0.0–0.2)

## 2019-10-17 LAB — COMPREHENSIVE METABOLIC PANEL
ALT: 56 U/L — ABNORMAL HIGH (ref 0–44)
AST: 66 U/L — ABNORMAL HIGH (ref 15–41)
Albumin: 1.9 g/dL — ABNORMAL LOW (ref 3.5–5.0)
Alkaline Phosphatase: 99 U/L (ref 38–126)
Anion gap: 11 (ref 5–15)
BUN: 73 mg/dL — ABNORMAL HIGH (ref 8–23)
CO2: 23 mmol/L (ref 22–32)
Calcium: 7.8 mg/dL — ABNORMAL LOW (ref 8.9–10.3)
Chloride: 100 mmol/L (ref 98–111)
Creatinine, Ser: 3.13 mg/dL — ABNORMAL HIGH (ref 0.61–1.24)
GFR calc Af Amer: 21 mL/min — ABNORMAL LOW (ref >60)
GFR calc non Af Amer: 18 mL/min — ABNORMAL LOW (ref >60)
Glucose, Bld: 157 mg/dL — ABNORMAL HIGH (ref 70–99)
Potassium: 4 mmol/L (ref 3.5–5.1)
Sodium: 134 mmol/L — ABNORMAL LOW (ref 135–145)
Total Bilirubin: 0.3 mg/dL (ref 0.3–1.2)
Total Protein: 7.2 g/dL (ref 6.5–8.1)

## 2019-10-17 LAB — URINE CULTURE: Special Requests: NORMAL

## 2019-10-17 MED ORDER — SODIUM CHLORIDE 0.9 % IV BOLUS
500.0000 mL | Freq: Once | INTRAVENOUS | Status: AC
  Administered 2019-10-17: 500 mL via INTRAVENOUS

## 2019-10-17 MED ORDER — SODIUM CHLORIDE 0.9 % IV SOLN: INTRAVENOUS | Status: AC

## 2019-10-17 NOTE — Evaluation (Signed)
Clinical/Bedside Swallow Evaluation Patient Details  Name: Donald [name] MRN: [id number] Date of Birth: 04-14-1941  Today's Date: 10/17/2019 Time: SLP Start Time (ACUTE ONLY): 1029 SLP Stop Time (ACUTE ONLY): 1041 SLP Time Calculation (min) (ACUTE ONLY): 12 min  Past Medical History:  Past Medical History:  Diagnosis Date  . Acute kidney failure, unspecified (Donald [name])   . Acute respiratory failure with hypoxia (Donald [name])   . Benign localized prostatic hyperplasia without lower urinary tract symptoms (LUTS)   . Clinical diagnosis of COVID-19   . Cognitive communication deficit   . Dementia (Donald [name])   . Gastroesophageal reflux disease without esophagitis   . Hypertension   . Impaired gait and mobility   . Metabolic encephalopathy   . Muscle weakness (generalized)   . Oropharyngeal dysphagia   . Other lack of coordination   . Other seizures (Donald [name])   . Pneumonia due to 2019-nCoV   . Septic shock due to undetermined organism (Donald [name])   . Unspecified hydronephrosis   . Unsteadiness on feet    Past Surgical History:  Past Surgical History:  Procedure Laterality Date  . NO PAST SURGERIES     HPI:  Pt is 79 y.o. male with medical history significant of hypertension, dementia, CVA and oropharyngeal dysphagia and now admitted with sepsis, ARF and syncope. Most recent BSE completed on 08/21/19 with recommendations for  Dysphagia 1 (puree) solids and thin liquids.   Assessment / Plan / Recommendation Clinical Impression  Pt was seen for a bedside swallow evaluation and he presents with oral dysphagia and suspected functional pharyngeal phase of the swallow.  He was encountered awake/alert with bilateral mitts in place.  He exhibited few verbalizations and demonstrated some difficulty with following commands.  Pt was seen with trials of thin liquid, puree, and ground solids.  He exhibited good bolus acceptance and consistent hyolaryngeal elevation/excursion to observation with all trials.   Mastication was prolonged, likely secondary to dentition (top dentures and missing bottom dentition) and trace oral residue was observed following swallow iniation.  No overt s/sx of aspiration were observed with any trials.  Recommend continuation of Dysphagia 1 (puree) solids and thin liquids with medications administered crushed in puree.  SLP will f/u briefly to monitor diet tolerance and to determine if pt is appropriate for a clinical diet upgrade.    SLP Visit Diagnosis: Dysphagia, oral phase (R13.11)    Aspiration Risk  Mild aspiration risk    Diet Recommendation Dysphagia 1 (Puree);Thin liquid   Liquid Administration via: Cup;Straw Medication Administration: Crushed with puree Supervision: Staff to assist with self feeding;Full supervision/cueing for compensatory strategies Compensations: Minimize environmental distractions;Slow rate;Small sips/bites Postural Changes: Seated upright at 90 degrees    Other  Recommendations Oral Care Recommendations: Oral care BID;Staff/trained caregiver to provide oral care   Follow up Recommendations Skilled Nursing facility      Frequency and Duration min 1 x/week  1 week       Prognosis Prognosis for Safe Diet Advancement: Fair Barriers to Reach Goals: Cognitive deficits      Swallow Study   General HPI: Pt is 79 y.o. male with medical history significant of hypertension, dementia, CVA and oropharyngeal dysphagia and now admitted with sepsis, ARF and syncope. BSE completed on 08/21/19 with recommendations for  Dysphagia 1 (puree) solids and thin liquids. Type of Study: Bedside Swallow Evaluation Previous Swallow Assessment: See HPI  Diet Prior to this Study: Dysphagia 1 (puree);Thin liquids Temperature Spikes Noted: No Respiratory Status: Room air History of Recent Intubation:  No Behavior/Cognition: Alert;Doesn't follow directions;Requires cueing Oral Cavity Assessment: Within Functional Limits Oral Care Completed by SLP: No Oral  Cavity - Dentition: Missing dentition(top dentures ) Self-Feeding Abilities: Needs assist Patient Positioning: Upright in bed Baseline Vocal Quality: Low vocal intensity Volitional Cough: Cognitively unable to elicit Volitional Swallow: Unable to elicit    Oral/Motor/Sensory Function Overall Oral Motor/Sensory Function: Other (comment)(Unable to evaluate - pt did not follow commands )   Ice Chips Ice chips: Not tested   Thin Liquid Thin Liquid: Within functional limits Presentation: Straw    Nectar Thick Nectar Thick Liquid: Not tested   Honey Thick Honey Thick Liquid: Not tested   Puree Puree: Within functional limits Presentation: Spoon   Solid     Solid: Impaired Presentation: Spoon Oral Phase Impairments: Impaired mastication Oral Phase Functional Implications: Impaired mastication;Prolonged oral transit;Oral residue     [name]., M.S., CCC-SLP Acute Rehabilitation Services Office: [phone]  [name] 10/17/2019,11:42 AM

## 2019-10-17 NOTE — Progress Notes (Addendum)
PROGRESS NOTE   Donald [name]  GNF:[id number]    DOB: Dec 06, 1940    DOA: 10/14/2019  PCP: Pearson Grippe, MD   I have briefly reviewed patients previous medical records in [facility].  Chief Complaint:   Chief Complaint  Patient presents with  . Failure To Thrive    Brief Narrative:  79 year old married male, PMH of advanced Alzheimer's dementia, BPH, indwelling Foley catheter, GERD, HTN, dysphagia on pured diet, prior acute kidney injury, hydronephrosis, COVID-19 and acute kidney injury, presented to the [facility] ED on 10/14/2019 due to altered mental status, hypotension and weakness. He was admitted for sepsis due to complicated catheter associated UTI, ongoing urinary retention, dehydration with hypernatremia, acute kidney injury, hyperkalemia and lactic acidosis.  Hyperkalemia resolved.  AKI improving.  Foley catheter changed 3/13.  Transferred from stepdown to medical bed 3/13.  Continues to gradually improve.   Assessment & Plan:  Principal Problem:   Sepsis secondary to UTI [name]) Active Problems:   Dementia in Alzheimer's disease (HCC)   Hypertension   AKI (acute kidney injury) (HCC)   Hypernatremia   Lactic acidosis   Anemia   Hyperkalemia   Pressure injury of skin   Sepsis due to indwelling Foley catheter associated UTI: Present on admission. Treated per sepsis protocol with IV fluids and IV ceftriaxone. Sepsis physiology has resolved. Blood cultures x2: Negative to date.  Initial urine culture was not helpful: Multiple species, possibly contaminant.  At urinary catheter change 3/13, noted purulent discharge at penis and urine milky brown-yellow color with sediments.  Unfortunately repeat urine culture again shows multiple species.  Day 3/5 ceftriaxone.  Addendum: Discussed with spouse in detail.  Patient ambulated with the help of a walker and supervision on day prior to admission.  Has home health PT, OT and ST since recent discharge from SNF.   Follows with a Insurance underwriter on [employer] in [place].  States that he does speak some and actually speaking more since he got sick.  She has not seen him in the hospital since ED and plans to visit him tomorrow.  She plans to take him back home with home health services.  Dehydration with hypernatremia: Suspect due to advanced dementia and poor oral intake.  Serum sodium has improved from 153 on admission.  Initially treated with IV D5 infusion.  Hypernatremia resolved.  Serum sodium down to 134 today, changed IV fluids to normal saline infusion at reduced rate.  Continue IV fluids probably for additional 24 to 48 hours .  Acute kidney injury complicating stage IIIa CKD: Multifactorial due to dehydration, ACEI and urinary retention. Held ACEI. Continue IV fluids.  Creatinine on admission 6.46. Had urinary retention on admission despite Foley catheter which was blocked.. Renal ultrasound showed no acute findings and no evidence of hydronephrosis. Baseline creatinine probably in the 1.3 range.  Foley catheter changed 3/13.  Creatinine gradually improving, down to 3.13 today.  Continue IV fluids, changed to normal saline infusion at reduced rate.  Follow BMP.  Hyperkalemia: Held lisinopril. No acute EKG changes.  Potassium 6 on admission.  Resolved after a dose of Kayexalate enema and a dose of Lokelma on 3/12.  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 Despite catheter, patient's bladder scan shows 531 mL and palpable suprapubic bladder on 3/12.  Foley catheter changed 3/13.  Needs outpatient follow-up with urology.  Lactic acidosis: Secondary to sepsis and dehydration. Resolved after IV fluids.  Anemia: Unspecified.  Hemoglobin dropped from 12-10.4 in the absence of overt bleeding.  Stable.  Leukocytosis: Likely due to sepsis and stress response. Follow CBC daily.  Slowly improving.  Advanced dementia with acute metabolic  encephalopathy: Acute encephalopathy likely due to acute illness as noted above. Patient is nonverbal and does not follow instructions. May not be far from the baseline as per RN.  Requested RN to discuss with family to determine if his mental status currently is at baseline.  Dysphagia: Reportedly on pured diet at home.  Tolerating diet but not eating much.  ST input appreciated and recommend dysphagia 1 diet and thin liquids.  Essential hypertension: Currently controlled off of antihypertensives. Holding lisinopril and amlodipine.  Adult failure to thrive: PMT GOC input appreciated, plan to return home with home health services at this time.  Multiple wounds: Present on admission.  Has superficial wound on scrotum, unstageable wound on right heel and mid sacral clean wound in gluteal cleft without acute findings.  WOC RN consulted, input pending.  Body mass index is 23.27 kg/m.   Abnormal LFTs: Unclear etiology.  Mild transaminitis, stable.  Severe protein calorie malnutrition: RD input appreciated.   DVT prophylaxis: Subcutaneous heparin Code Status: DNR Family Communication: None at bedside.  I discussed in detail with patient spouse via phone on 3/14, updated care and answered questions.  She is unsure when the Foley catheter was actually placed.  She indicates that he returned from SNF with that on 2/26. Disposition:  . Patient came from: Home           . Anticipated d/c place: Likely DC home with home health services as per PMT discussion with spouse. . Barriers to d/c: Acute kidney injury, UTI sepsis.  Transferred from stepdown to medical bed on 3/13.  Possible DC home in 48 hours.   Consultants:   None  Procedures:   Has indwelling Foley catheter from PTA, changed on 3/13  Antimicrobials:   IV ceftriaxone   Subjective:  Remains nonverbal.  However appears more alert, looking out of window.  Tracks activity in the room.  Appeared to want to say something but did not.   As per RN, no acute issues noted.  Objective:   Vitals:   10/16/19 1759 10/16/19 2204 10/17/19 0506 10/17/19 1310  BP: 128/62 135/85 (!) 147/73 105/88  Pulse: (!) 110 (!) 108 100 (!) 115  Resp: 16 18 18 19   Temp: 97.8 F (36.6 C) 98.3 F (36.8 C) 98.2 F (36.8 C) (!) 97.5 F (36.4 C)  TempSrc: Oral Oral Oral Oral  SpO2: 94% 98% 97% (!) 73%  Weight:      Height:        General exam: Elderly male, moderately built, frail and chronically ill looking lying comfortably propped up in bed. Oral mucosa moist. Respiratory system: Clear to auscultation.  No increased work of breathing. Cardiovascular system: S1 and S2 heard, RRR.  No JVD, murmurs or pedal edema. Gastrointestinal system: Abdomen is nondistended, soft and nontender. No organomegaly or masses felt. Normal bowel sounds heard. GU: Indwelling urinary catheter Central nervous system: Alert, non verbal, tracks activity with eyes. No focal neurological deficits.  Mental status as noted above. Extremities: Moves all limbs symmetrically Skin: Dry and flaky skin, better today. Has superficial wound on scrotum, unstageable wound on right heel and mid sacral clean wound in gluteal cleft without acute findings, as examined on 3/13 Psychiatry: Judgement and insight impaired. Mood & affect cannot be assessed    Data Reviewed:  I have personally reviewed following labs and imaging studies   CBC: Recent Labs  Lab 10/15/19 0440 10/16/19 0218 10/17/19 0545  WBC 21.1* 18.2* 14.8*  NEUTROABS 18.6* 15.8* 12.5*  HGB 10.4* 10.6* 9.7*  HCT 36.4* 37.2* 32.7*  MCV 87.1 87.5 85.2  PLT 422* 424* 463*    Basic Metabolic Panel: Recent Labs  Lab 10/16/19 0218 10/16/19 1659 10/17/19 0545  NA 145 138 134*  K 5.0 4.5 4.0  CL 109 105 100  CO2 21* 20* 23  GLUCOSE 134* 127* 157*  BUN 98* 82* 73*  CREATININE 4.88* 3.92* 3.13*  CALCIUM 8.3* 8.0* 7.8*    Liver Function Tests: Recent Labs  Lab 10/15/19 0440 10/16/19 0218  10/17/19 0545  AST 77* 68* 66*  ALT 72* 63* 56*  ALKPHOS 99 99 99  BILITOT 0.4 0.4 0.3  PROT 7.5 7.7 7.2  ALBUMIN 2.1* 2.1* 1.9*    CBG: No results for input(s): GLUCAP in the last 168 hours.  Microbiology Studies:   Recent Results (from the past 240 hour(s))  Blood Culture (routine x 2)     Status: None (Preliminary result)   Collection Time: 10/14/19  1:21 PM   Specimen: BLOOD  Result Value Ref Range Status   Specimen Description   Final    BLOOD LEFT ANTECUBITAL Performed at [facility] [address], [place], [place] [address]    Special Requests   Final    BOTTLES DRAWN AEROBIC AND ANAEROBIC Blood Culture adequate volume Performed at [facility] [address] [place], [place] [address]    Culture   Final    NO GROWTH 2 DAYS Performed at [facility] [address] [place], [place] [address]    Report Status PENDING  Incomplete  Blood Culture (routine x 2)     Status: None (Preliminary result)   Collection Time: 10/14/19  1:37 PM   Specimen: BLOOD  Result Value Ref Range Status   Specimen Description   Final    BLOOD RIGHT ANTECUBITAL Performed at [facility] [address] [place], [place] [address]    Special Requests   Final    BOTTLES DRAWN AEROBIC AND ANAEROBIC Blood Culture adequate volume Performed at [facility] [address], [place], [place] [address]    Culture   Final    NO GROWTH 2 DAYS Performed at [facility] [address], [place], [place] [address]    Report Status PENDING  Incomplete  Urine culture     Status: Abnormal   Collection Time: 10/14/19  2:48 PM   Specimen: In/Out Cath Urine  Result Value Ref Range Status   Specimen Description   Final    IN/OUT CATH URINE Performed at [facility] [address], [place], [place] [address]    Special Requests   Final    NONE Performed at [facility], [place] [address] [place], [place] [address]    Culture MULTIPLE SPECIES PRESENT, SUGGEST RECOLLECTION (A)  Final   Report Status 10/15/2019 FINAL  Final  MRSA PCR Screening     Status: None   Collection Time: 10/15/19  4:37 PM   Specimen: Nasal Mucosa; Nasopharyngeal  Result Value Ref Range Status   MRSA by PCR NEGATIVE NEGATIVE Final    Comment:        The GeneXpert MRSA Assay (FDA approved for NASAL specimens only), is one component of a comprehensive MRSA  colonization surveillance program. It is not intended to diagnose MRSA infection nor to guide or monitor treatment for MRSA infections. Performed at [facility], [address] [place], [place] [address]   Culture, Urine     Status: Abnormal   Collection Time: 10/16/19 10:06 AM   Specimen: Urine, Random  Result Value Ref Range Status   Specimen Description   Final    URINE, RANDOM Performed at [facility], [address] [place], [place] [address]    Special Requests   Final    Normal Performed at [facility], [address], [place], [place] [address]    Culture MULTIPLE SPECIES PRESENT, SUGGEST RECOLLECTION (A)  Final   Report Status 10/17/2019 FINAL  Final     Radiology Studies:  No results found.   Scheduled Meds:   . Chlorhexidine Gluconate Cloth  6 each Topical Daily  . feeding supplement (ENSURE ENLIVE)  237 mL Oral BID BM  . feeding supplement (PRO-STAT SUGAR FREE 64)  30 mL Oral BID  . heparin  5,000 Units Subcutaneous Q12H  . mouth rinse  15 mL Mouth Rinse BID  . multivitamin  1 tablet Oral Daily  . sodium chloride flush  10-40 mL Intracatheter Q12H    Continuous Infusions:   . sodium chloride 75 mL/hr at 10/17/19 1004  . cefTRIAXone (ROCEPHIN)  IV Stopped (10/16/19 1450)     LOS: 3 days     Marcellus Scott, MD, [place], [facility]. [facility]    To contact the attending provider between 7A-7P or the covering provider  during after hours 7P-7A, please log into the web site [email or web address] and access using universal [place] password for that web site. If you do not have the password, please call the hospital operator.  10/17/2019, 1:36 PM

## 2019-10-18 ENCOUNTER — Inpatient Hospital Stay (HOSPITAL_COMMUNITY): Payer: Medicare Other

## 2019-10-18 LAB — COMPREHENSIVE METABOLIC PANEL
ALT: 52 U/L — ABNORMAL HIGH (ref 0–44)
AST: 66 U/L — ABNORMAL HIGH (ref 15–41)
Albumin: 2 g/dL — ABNORMAL LOW (ref 3.5–5.0)
Alkaline Phosphatase: 77 U/L (ref 38–126)
Anion gap: 12 (ref 5–15)
BUN: 59 mg/dL — ABNORMAL HIGH (ref 8–23)
CO2: 18 mmol/L — ABNORMAL LOW (ref 22–32)
Calcium: 7.8 mg/dL — ABNORMAL LOW (ref 8.9–10.3)
Chloride: 108 mmol/L (ref 98–111)
Creatinine, Ser: 2.38 mg/dL — ABNORMAL HIGH (ref 0.61–1.24)
GFR calc Af Amer: 29 mL/min — ABNORMAL LOW (ref >60)
GFR calc non Af Amer: 25 mL/min — ABNORMAL LOW (ref >60)
Glucose, Bld: 117 mg/dL — ABNORMAL HIGH (ref 70–99)
Potassium: 4.5 mmol/L (ref 3.5–5.1)
Sodium: 138 mmol/L (ref 135–145)
Total Bilirubin: 0.7 mg/dL (ref 0.3–1.2)
Total Protein: 6.9 g/dL (ref 6.5–8.1)

## 2019-10-18 LAB — CBC WITH DIFFERENTIAL/PLATELET
Abs Immature Granulocytes: 0.2 10*3/uL — ABNORMAL HIGH (ref 0.00–0.07)
Basophils Absolute: 0.1 10*3/uL (ref 0.0–0.1)
Basophils Relative: 0 %
Eosinophils Absolute: 0.2 10*3/uL (ref 0.0–0.5)
Eosinophils Relative: 1 %
HCT: 32.9 % — ABNORMAL LOW (ref 39.0–52.0)
Hemoglobin: 9.7 g/dL — ABNORMAL LOW (ref 13.0–17.0)
Immature Granulocytes: 1 %
Lymphocytes Relative: 7 %
Lymphs Abs: 1.1 10*3/uL (ref 0.7–4.0)
MCH: 25.3 pg — ABNORMAL LOW (ref 26.0–34.0)
MCHC: 29.5 g/dL — ABNORMAL LOW (ref 30.0–36.0)
MCV: 85.7 fL (ref 80.0–100.0)
Monocytes Absolute: 0.8 10*3/uL (ref 0.1–1.0)
Monocytes Relative: 5 %
Neutro Abs: 13.4 10*3/uL — ABNORMAL HIGH (ref 1.7–7.7)
Neutrophils Relative %: 86 %
Platelets: 442 10*3/uL — ABNORMAL HIGH (ref 150–400)
RBC: 3.84 MIL/uL — ABNORMAL LOW (ref 4.22–5.81)
RDW: 17.2 % — ABNORMAL HIGH (ref 11.5–15.5)
WBC: 15.7 10*3/uL — ABNORMAL HIGH (ref 4.0–10.5)
nRBC: 0 % (ref 0.0–0.2)

## 2019-10-18 MED ORDER — SODIUM CHLORIDE 0.9 % IV SOLN: INTRAVENOUS | Status: DC

## 2019-10-18 MED ORDER — FLUCONAZOLE IN SODIUM CHLORIDE 200-0.9 MG/100ML-% IV SOLN
200.0000 mg | Freq: Once | INTRAVENOUS | Status: AC
  Administered 2019-10-18: 200 mg via INTRAVENOUS
  Filled 2019-10-18: qty 100

## 2019-10-18 MED ORDER — FLUCONAZOLE 100MG IVPB
100.0000 mg | INTRAVENOUS | Status: DC
  Filled 2019-10-18: qty 50

## 2019-10-18 NOTE — Progress Notes (Signed)
Physical Therapy Treatment Patient Details Name: Donald [name] MRN: [id number] DOB: January 14, 1941 Today's Date: 10/18/2019    History of Present Illness Pt is 79 y.o. male with medical history significant of hypertension, dementia, CVA and oropharyngeal dysphagia and now admitted with sepsis, ARF and syncope.      PT Comments    Spouse present during session and very helpful to inform therapist on past mobility ability.  Pt recently was D/C from a SNF for rehab and was able to amb "some" hand held assist.  Pt was able to feed self after set up.  Pt was able to speak wants/needs "some".  "now he is stubborn" stated spouse.  "He will let you know if he doesn't want to do something".   Currently pt in bed curled in fetal position with B mittens. Lethargic for most part.  Assisted to EOB to attempt arousal.   General bed mobility comments: despite max tactile and verbal stimuli, pt remained non verbal and rigid.  required Total Assist (pt 0%) to transfer from sidelying to EOB.  Pt present with B hips and knee flexion contractures.  Was able to maintain static sitting balance at Supervision once positioned to midline but easily LOB all planes.  Pt also present with forward flex seated posture.  Brief moments of open eyes but for most part non verbal/non engaging. General transfer comment: attempted sit to stand + 2 side by side assist however pt offered 0%.  "Bear Hug" SPS 1/4 turn to recliner Therapsit 100%.  Only sight increased cognition noted. Positioned in recliner upright and attempted to get pt to eat.  Only took a few small bites of sherbet offered by spouse.    Follow Up Recommendations  SNF     Equipment Recommendations  None recommended by PT    Recommendations for Other Services       Precautions / Restrictions Precautions Precautions: Fall Precaution Comments: Hx Alzheimers, CVA Restrictions Weight Bearing Restrictions: No    Mobility  Bed Mobility Overal bed  mobility: Needs Assistance Bed Mobility: Sit to Supine     Supine to sit: Total assist;+2 for physical assistance;+2 for safety/equipment(pt 0%)     General bed mobility comments: despite max tactile and verbal stimuli, pt remained non verbal and rigid.  required Total Assist (pt 0%) to transfer from sidelying to EOB.  Pt present with B hips and knee flexion contractures.  Was able to maintain static sitting balance at Supervision once positioned to midline but easily LOB all planes.  Pt also present with forward flex seated posture.  Brief moments of open eyes but for most part non verbal/non engaging.  Transfers   Equipment used: 2 person hand held assist   Sit to Stand: Total assist;+2 physical assistance;+2 safety/equipment         General transfer comment: attempted sit to stand + 2 side by side assist however pt offered 0%.  "Bear Hug" SPS 1/4 turn to recliner Therapsit 100%.  Only sight increased cognition noted.  Ambulation/Gait             General Gait Details: unable to attempt due to poor transfer ability.   Stairs             Wheelchair Mobility    Modified Rankin (Stroke Patients Only)       Balance  Cognition Arousal/Alertness: Lethargic Behavior During Therapy: Flat affect Overall Cognitive Status: Impaired/Different from baseline                                 General Comments: currently lethargic with brief eyes open with verbal stimuli.  Spouse stated prior pt was able to verbalize one/two words and able to follow simple, repeated commands.      Exercises      General Comments        Pertinent Vitals/Pain Pain Assessment: No/denies pain    Home Living                      Prior Function            PT Goals (current goals can now be found in the care plan section) Progress towards PT goals: Progressing toward goals    Frequency    Min  3X/week      PT Plan Current plan remains appropriate    Co-evaluation              AM-PAC PT "6 Clicks" Mobility   Outcome Measure  Help needed turning from your back to your side while in a flat bed without using bedrails?: Total Help needed moving from lying on your back to sitting on the side of a flat bed without using bedrails?: Total Help needed moving to and from a bed to a chair (including a wheelchair)?: Total Help needed standing up from a chair using your arms (e.g., wheelchair or bedside chair)?: Total Help needed to walk in hospital room?: Total Help needed climbing 3-5 steps with a railing? : Total 6 Click Score: 6    End of Session Equipment Utilized During Treatment: Gait belt Activity Tolerance: Patient limited by lethargy Patient left: in chair;with family/visitor present;with call bell/phone within reach Nurse Communication: Mobility status PT Visit Diagnosis: Difficulty in walking, not elsewhere classified (R26.2);Muscle weakness (generalized) (M62.81)     Time: 2263-3354 PT Time Calculation (min) (ACUTE ONLY): 24 min  Charges:  $Therapeutic Activity: 23-37 mins                     Felecia Shelling  PTA Acute  Rehabilitation Services Pager      [phone] Office      [phone]

## 2019-10-18 NOTE — Care Management Important Message (Signed)
Important Message  Patient Details IM Letter given to [name] RN Case Manager to present to the Patient Name: Donald [name] MRN: [id number] Date of Birth: 03/05/1941   Medicare Important Message Given:  Yes     Caren Macadam 10/18/2019, 10:49 AM

## 2019-10-18 NOTE — Progress Notes (Signed)
Physical Therapy Treatment Patient Details Name: Donald [name] MRN: [id number] DOB: 1941-05-26 Today's Date: 10/18/2019    History of Present Illness Pt is 79 y.o. male with medical history significant of hypertension, dementia, CVA and oropharyngeal dysphagia and now admitted with sepsis, ARF and syncope.      PT Comments    Assisted pt back to bed.  Required + 2 Total Assist unable to stand present with rigidity throughout and "skiing" gait.  Positioned LEFT sidelying due to stage 2 sacral ulcer.    Follow Up Recommendations  SNF     Equipment Recommendations  None recommended by PT    Recommendations for Other Services       Precautions / Restrictions Precautions Precautions: Fall Precaution Comments: Hx Alzheimers, CVA Restrictions Weight Bearing Restrictions: No    Mobility  Bed Mobility Overal bed mobility: Needs Assistance Bed Mobility: Supine to sit      sit to supine : Total assist;+2 for physical assistance;+2 for safety/equipment(pt 0%)    assisted back to bed LEFT sidelying Transfers   Equipment used: 2 person hand held assist   Sit to Stand: Total assist;+2 physical assistance;+2 safety/equipment         General transfer comment: attempted sit to stand + 2 side by side assist however pt offered 0%.  + 2 side by side assist  SPS 1/4 turn back to bed pt unable to support his own weight.  B flex hips and knees.  Pt unable to initaite steppage.  Pt "skiing".   Ambulation/Gait             General Gait Details: unable to attempt due to poor transfer ability.   Stairs             Wheelchair Mobility    Modified Rankin (Stroke Patients Only)       Balance                                            Cognition Arousal/Alertness: Lethargic Behavior During Therapy: Flat affect Overall Cognitive Status: Impaired/Different from baseline                                 General Comments:  currently lethargic with brief eyes open with verbal stimuli.  Spouse stated prior pt was able to verbalize one/two words and able to follow simple, repeated commands.      Exercises      General Comments        Pertinent Vitals/Pain Pain Assessment: No/denies pain    Home Living                      Prior Function            PT Goals (current goals can now be found in the care plan section) Progress towards PT goals: Progressing toward goals    Frequency    Min 3X/week      PT Plan Current plan remains appropriate    Co-evaluation              AM-PAC PT "6 Clicks" Mobility   Outcome Measure  Help needed turning from your back to your side while in a flat bed without using bedrails?: Total Help needed moving from lying on your back to sitting on the side  of a flat bed without using bedrails?: Total Help needed moving to and from a bed to a chair (including a wheelchair)?: Total Help needed standing up from a chair using your arms (e.g., wheelchair or bedside chair)?: Total Help needed to walk in hospital room?: Total Help needed climbing 3-5 steps with a railing? : Total 6 Click Score: 6    End of Session Equipment Utilized During Treatment: Gait belt Activity Tolerance: Patient limited by lethargy Patient left: in bed;with bed alarm set Nurse Communication: Mobility status PT Visit Diagnosis: Difficulty in walking, not elsewhere classified (R26.2);Muscle weakness (generalized) (M62.81)     Time: 4481-8563 PT Time Calculation (min) (ACUTE ONLY): 15 min  Charges:  $Therapeutic Activity: 8-22 mins                     Rica Koyanagi  PTA Acute  Rehabilitation Services Pager      [phone] Office      [phone]

## 2019-10-18 NOTE — Plan of Care (Signed)
  Problem: Clinical Measurements: Goal: Ability to maintain clinical measurements within normal limits will improve Outcome: Progressing Goal: Diagnostic test results will improve Outcome: Progressing Goal: Respiratory complications will improve Outcome: Progressing Goal: Cardiovascular complication will be avoided Outcome: Progressing   Problem: Activity: Goal: Risk for activity intolerance will decrease Outcome: Progressing   Problem: Coping: Goal: Level of anxiety will decrease Outcome: Progressing   Problem: Elimination: Goal: Will not experience complications related to bowel motility Outcome: Progressing Goal: Will not experience complications related to urinary retention Outcome: Progressing   Problem: Pain Managment: Goal: General experience of comfort will improve Outcome: Progressing   Problem: Safety: Goal: Ability to remain free from injury will improve Outcome: Progressing   Problem: Education: Goal: Knowledge of General Education information will improve Description: Including pain rating scale, medication(s)/side effects and non-pharmacologic comfort measures Outcome: Not Progressing   Problem: Health Behavior/Discharge Planning: Goal: Ability to manage health-related needs will improve Outcome: Not Progressing   Problem: Clinical Measurements: Goal: Will remain free from infection Outcome: Not Progressing   Problem: Nutrition: Goal: Adequate nutrition will be maintained Outcome: Not Progressing   Problem: Skin Integrity: Goal: Risk for impaired skin integrity will decrease Outcome: Not Progressing

## 2019-10-18 NOTE — Progress Notes (Signed)
Pt LOC recorded in error at 1700.  LOC has not changed since admission.

## 2019-10-18 NOTE — Consult Note (Signed)
WOC Nurse Consult Note: Reason for Consult: Consult requested for several wounds. Wound type: Sacrum stage 2 pressure injury, .3X.3X.1cm, pink and dry Posterior scrotum 2 pressure injury, 2X2X.1cm, pink and dry Right heel deep tissue pressure injury dark red colored skin, 4X4cm Pressure Injury POA: Yes Dressing procedure/placement/frequency: Float heels to reduce pressure. Topical treatment orders provided for bedside nurses to perform to protect and promote healing as follows: Foam dressing to right heel, posterior scrotum, sacrum.  Change Q 3 days or PRN soiling Please re-consult if further assistance is needed.  Thank-you,  Cammie Mcgee MSN, RN, CWOCN, [place], CNS [phone]

## 2019-10-18 NOTE — Progress Notes (Signed)
PROGRESS NOTE   Donald [name]  HUD:[id number]    DOB: 11-Oct-1940    DOA: 10/14/2019  PCP: Pearson Grippe, MD   I have briefly reviewed patients previous medical records in [facility].  Chief Complaint:   Chief Complaint  Patient presents with  . Failure To Thrive    Brief Narrative:  78 year old married male, PMH of advanced Alzheimer's dementia, BPH, indwelling Foley catheter, GERD, HTN, dysphagia on pured diet, prior acute kidney injury, hydronephrosis, COVID-19 and acute kidney injury, presented to the [facility] ED on 10/14/2019 due to altered mental status, hypotension and weakness. He was admitted for sepsis due to complicated catheter associated UTI, ongoing urinary retention, dehydration with hypernatremia, acute kidney injury, hyperkalemia and lactic acidosis.  Hyperkalemia resolved.  AKI improving.  Foley catheter changed 3/13.  Transferred from stepdown to medical bed 3/13.  Metabolically continuing to improve but mental status worse today than yesterday, unclear etiology.   Assessment & Plan:  Principal Problem:   Sepsis secondary to UTI [facility]) Active Problems:   Dementia in Alzheimer's disease (HCC)   Hypertension   AKI (acute kidney injury) (HCC)   Hypernatremia   Lactic acidosis   Anemia   Hyperkalemia   Pressure injury of skin   Sepsis due to indwelling Foley catheter associated UTI: Present on admission. Treated per sepsis protocol with IV fluids and IV ceftriaxone. Sepsis physiology has resolved. Blood cultures x2: Negative to date. At urinary catheter change 3/13, noted purulent discharge at penis and urine milky brown-yellow color with sediments.  Unfortunately urine cultures x2 have shown multiple species and not helpful to guide antibiotic therapy.  Day 5 of IV ceftriaxone.  Patient has been afebrile but leukocytosis is slightly worse today than yesterday and so also mental status.  Unclear if this is related to UTI.  Discussed extensively  with pharmacy.  Urine microscopy 3/11 showed some budding yeast.  They recommended fluconazole x14 days, continue ceftriaxone for now and total antibiotics for 10 to 14 days.  However if his leukocytosis continues to worsen or he develops fever then may need to consider broadening antibiotics to Carbapenem.  Outpatient follow-up with his urologist.  Dehydration with hypernatremia: Suspect due to advanced dementia and poor oral intake.  Serum sodium has improved from 153 on admission.  Initially treated with IV D5 infusion.  Hypernatremia resolved.  Dehydration resolved.  However remains at high risk for recurrent dehydration due to mental status changes and ongoing very poor oral intake.  Acute kidney injury complicating stage IIIa CKD: Multifactorial due to dehydration, ACEI and urinary retention. Held ACEI. Continue IV fluids.  Creatinine on admission 6.46. Had urinary retention on admission despite Foley catheter which was blocked.. Renal ultrasound showed no acute findings and no evidence of hydronephrosis. Baseline creatinine probably in the 1.3 range.  Foley catheter changed 3/13.  Creatinine gradually improving, down to 2.38 today.  Continue IV fluids, changed to normal saline infusion at reduced rate.  Follow BMP.  Hyperkalemia: Held lisinopril. No acute EKG changes.  Potassium 6 on admission.  Resolved after a dose of Kayexalate enema and a dose of Lokelma on 3/12.  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 Despite catheter, patient's bladder scan shows 531 mL and palpable suprapubic bladder on 3/12.  Foley catheter changed 3/13.  Needs outpatient follow-up with urology (he has a urologist).  Lactic acidosis: Secondary to sepsis and dehydration. Resolved after IV fluids.  Anemia: Unspecified. Hemoglobin dropped  from 12-10.4 in the absence of overt bleeding.  Stable.  Leukocytosis: Likely due to sepsis and stress response. Follow  CBC daily.  Slightly worse today than yesterday.  Advanced dementia with acute metabolic encephalopathy: Acute encephalopathy likely due to acute illness as noted above. Patient is nonverbal and does not follow instructions since this hospital admission.  As discussed with spouse yesterday and as per PT note today (copied below), was able to ambulate and speak "some".  Unable to do that now.  Also less alert and more lethargic today than yesterday.  Will get CT head to rule out acute stroke although no focal deficits.  If that is negative then can pursue MRI but even if stroke found, unsure what aggressive treatment can change his overall poor prognosis.  PT note from 3/15 "some" hand held assist.  Pt was able to feed self after set up.  Pt was able to speak wants/needs "some".  "now he is stubborn" stated spouse.  "He will let you know if he doesn't want to do something".    I discussed in detail with patient spouse on 3/15.  Updated her on patient's decline over the last 24 to 48 hours and lack of adequate progress.  It appears that she is open to considering hospice if he does not improve over the next short period.  Please discuss with her and may consider inviting PMT team back.  Dysphagia: Reportedly on pured diet at home. ST input appreciated and recommend dysphagia 1 diet and thin liquids.  Patient refusing and not taking much.  Essential hypertension: Currently controlled off of antihypertensives. Holding lisinopril and amlodipine.  Adult failure to thrive: PMT GOC input appreciated, plan to return home with home health services at this time.  See discussion above.  Multiple wounds: Present on admission.  Has superficial wound on scrotum, unstageable wound on right heel and mid sacral clean wound in gluteal cleft without acute findings.  [place] RN input appreciated.  Body mass index is 23.27 kg/m.   Abnormal LFTs: Unclear etiology.  Mild transaminitis, stable.  Severe protein calorie  malnutrition: RD input appreciated.  Hypotension: Had issues with hypotension last afternoon, bolused and increased IV fluids.  Improved   DVT prophylaxis: Subcutaneous heparin Code Status: DNR Family Communication: None at bedside.  I discussed in detail with patient spouse via phone on 3/15, updated care and answered questions.  Please see discussion above. Disposition:  . Patient came from: Home           . Anticipated d/c place: Likely DC home with home health services as per PMT discussion with spouse. . Barriers to d/c: Worsening mental status.  Ongoing failure to thrive.   Consultants:   None  Procedures:   Has indwelling Foley catheter from PTA, changed on 3/13  Antimicrobials:   IV ceftriaxone   Subjective:  Looks worse than he did 24 to 48 hours ago.  Less responsive, more lethargic, refusing to eat or drink.  Objective:   Vitals:   10/17/19 1847 10/18/19 0256 10/18/19 0641 10/18/19 1345  BP: (!) 87/62 (!) 104/59 (!) 102/54 (!) 143/90  Pulse:  (!) 104 (!) 102 97  Resp:  15 19 15   Temp:  98.5 F (36.9 C) 98.4 F (36.9 C) 98.1 F (36.7 C)  TempSrc:  Oral Oral Oral  SpO2:  99% 100% (!) 78%  Weight:      Height:        General exam: Elderly male, moderately built, frail and chronically  ill looking lying comfortably propped up in bed.  He looks worse than he did over the last 24 to 48 hours. Respiratory system: Poor inspiratory effort.  Appears clear to auscultation.  No increased work of breathing. Cardiovascular system: S1 and S2 heard, RRR.  No JVD, murmurs or pedal edema. Gastrointestinal system: Abdomen is nondistended, soft and nontender. No organomegaly or masses felt. Normal bowel sounds heard. GU: Indwelling urinary catheter Central nervous system: Lethargic but arousable, opens his eyes but will not sustain.  Not tracking as much as he did before.  No focal deficits noted. Extremities: Moves all limbs symmetrically Skin: Dry and flaky skin, better  today. Has superficial wound on scrotum, unstageable wound on right heel and mid sacral clean wound in gluteal cleft without acute findings, as examined on 3/13 Psychiatry: Judgement and insight impaired. Mood & affect cannot be assessed    Data Reviewed:   I have personally reviewed following labs and imaging studies   CBC: Recent Labs  Lab 10/16/19 0218 10/17/19 0545 10/18/19 0520  WBC 18.2* 14.8* 15.7*  NEUTROABS 15.8* 12.5* 13.4*  HGB 10.6* 9.7* 9.7*  HCT 37.2* 32.7* 32.9*  MCV 87.5 85.2 85.7  PLT 424* 463* 442*    Basic Metabolic Panel: Recent Labs  Lab 10/16/19 1659 10/17/19 0545 10/18/19 0520  NA 138 134* 138  K 4.5 4.0 4.5  CL 105 100 108  CO2 20* 23 18*  GLUCOSE 127* 157* 117*  BUN 82* 73* 59*  CREATININE 3.92* 3.13* 2.38*  CALCIUM 8.0* 7.8* 7.8*    Liver Function Tests: Recent Labs  Lab 10/16/19 0218 10/17/19 0545 10/18/19 0520  AST 68* 66* 66*  ALT 63* 56* 52*  ALKPHOS 99 99 77  BILITOT 0.4 0.3 0.7  PROT 7.7 7.2 6.9  ALBUMIN 2.1* 1.9* 2.0*    CBG: No results for input(s): GLUCAP in the last 168 hours.  Microbiology Studies:   Recent Results (from the past 240 hour(s))  Blood Culture (routine x 2)     Status: None (Preliminary result)   Collection Time: 10/14/19  1:21 PM   Specimen: BLOOD  Result Value Ref Range Status   Specimen Description BLOOD LEFT ANTECUBITAL  Final   Special Requests   Final    BOTTLES DRAWN AEROBIC AND ANAEROBIC Blood Culture adequate volume Performed at [facility], [address] [place], [place] [address]    Culture NO GROWTH 4 DAYS  Final   Report Status PENDING  Incomplete  Blood Culture (routine x 2)     Status: None (Preliminary result)   Collection Time: 10/14/19  1:37 PM   Specimen: BLOOD  Result Value Ref Range Status   Specimen Description BLOOD RIGHT ANTECUBITAL  Final   Special Requests   Final    BOTTLES DRAWN AEROBIC AND ANAEROBIC Blood Culture adequate volume Performed  at [facility], [address], [place], [place] [address]    Culture NO GROWTH 4 DAYS  Final   Report Status PENDING  Incomplete  Urine culture     Status: Abnormal   Collection Time: 10/14/19  2:48 PM   Specimen: In/Out Cath Urine  Result Value Ref Range Status   Specimen Description   Final    IN/OUT CATH URINE Performed at [facility], [address] [place], [place] [address]    Special Requests   Final    NONE Performed at [facility], [address] [place], [place] [address]  Culture MULTIPLE SPECIES PRESENT, SUGGEST RECOLLECTION (A)  Final   Report Status 10/15/2019 FINAL  Final  MRSA PCR Screening     Status: None   Collection Time: 10/15/19  4:37 PM   Specimen: Nasal Mucosa; Nasopharyngeal  Result Value Ref Range Status   MRSA by PCR NEGATIVE NEGATIVE Final    Comment:        The GeneXpert MRSA Assay (FDA approved for NASAL specimens only), is one component of a comprehensive MRSA colonization surveillance program. It is not intended to diagnose MRSA infection nor to guide or monitor treatment for MRSA infections. Performed at [facility], [address] [place], [place] [address]   Culture, Urine     Status: Abnormal   Collection Time: 10/16/19 10:06 AM   Specimen: Urine, Random  Result Value Ref Range Status   Specimen Description   Final    URINE, RANDOM Performed at [facility], [address], [place], [place] [address]    Special Requests   Final    Normal Performed at [facility], [address] [place], [place] [address]    Culture MULTIPLE SPECIES PRESENT, SUGGEST RECOLLECTION (A)  Final   Report Status 10/17/2019 FINAL  Final     Radiology Studies:  No results found.   Scheduled Meds:   . Chlorhexidine Gluconate Cloth  6 each Topical Daily  . feeding supplement (ENSURE ENLIVE)  237 mL Oral BID BM  . feeding  supplement (PRO-STAT SUGAR FREE 64)  30 mL Oral BID  . heparin  5,000 Units Subcutaneous Q12H  . mouth rinse  15 mL Mouth Rinse BID  . multivitamin  1 tablet Oral Daily  . sodium chloride flush  10-40 mL Intracatheter Q12H    Continuous Infusions:   . cefTRIAXone (ROCEPHIN)  IV 1 g (10/18/19 1359)  . [START ON 10/19/2019] fluconazole (DIFLUCAN) IV       LOS: 4 days     Marcellus Scott, MD, [place], [facility]. [facility]    To contact the attending provider between 7A-7P or the covering provider during after hours 7P-7A, please log into the web site [email or web address] and access using universal [place] password for that web site. If you do not have the password, please call the hospital operator.  10/18/2019, 4:31 PM

## 2019-10-19 LAB — CULTURE, BLOOD (ROUTINE X 2)
Culture: NO GROWTH
Culture: NO GROWTH
Special Requests: ADEQUATE
Special Requests: ADEQUATE

## 2019-10-19 LAB — CBC WITH DIFFERENTIAL/PLATELET
Abs Immature Granulocytes: 0.23 10*3/uL — ABNORMAL HIGH (ref 0.00–0.07)
Basophils Absolute: 0.1 10*3/uL (ref 0.0–0.1)
Basophils Relative: 0 %
Eosinophils Absolute: 0.2 10*3/uL (ref 0.0–0.5)
Eosinophils Relative: 1 %
HCT: 33.6 % — ABNORMAL LOW (ref 39.0–52.0)
Hemoglobin: 9.8 g/dL — ABNORMAL LOW (ref 13.0–17.0)
Immature Granulocytes: 1 %
Lymphocytes Relative: 8 %
Lymphs Abs: 1.3 10*3/uL (ref 0.7–4.0)
MCH: 24.9 pg — ABNORMAL LOW (ref 26.0–34.0)
MCHC: 29.2 g/dL — ABNORMAL LOW (ref 30.0–36.0)
MCV: 85.5 fL (ref 80.0–100.0)
Monocytes Absolute: 0.9 10*3/uL (ref 0.1–1.0)
Monocytes Relative: 5 %
Neutro Abs: 13.4 10*3/uL — ABNORMAL HIGH (ref 1.7–7.7)
Neutrophils Relative %: 85 %
Platelets: 466 10*3/uL — ABNORMAL HIGH (ref 150–400)
RBC: 3.93 MIL/uL — ABNORMAL LOW (ref 4.22–5.81)
RDW: 17.2 % — ABNORMAL HIGH (ref 11.5–15.5)
WBC: 16 10*3/uL — ABNORMAL HIGH (ref 4.0–10.5)
nRBC: 0 % (ref 0.0–0.2)

## 2019-10-19 LAB — COMPREHENSIVE METABOLIC PANEL
ALT: 43 U/L (ref 0–44)
AST: 52 U/L — ABNORMAL HIGH (ref 15–41)
Albumin: 1.9 g/dL — ABNORMAL LOW (ref 3.5–5.0)
Alkaline Phosphatase: 81 U/L (ref 38–126)
Anion gap: 9 (ref 5–15)
BUN: 48 mg/dL — ABNORMAL HIGH (ref 8–23)
CO2: 18 mmol/L — ABNORMAL LOW (ref 22–32)
Calcium: 8 mg/dL — ABNORMAL LOW (ref 8.9–10.3)
Chloride: 114 mmol/L — ABNORMAL HIGH (ref 98–111)
Creatinine, Ser: 1.92 mg/dL — ABNORMAL HIGH (ref 0.61–1.24)
GFR calc Af Amer: 37 mL/min — ABNORMAL LOW (ref >60)
GFR calc non Af Amer: 32 mL/min — ABNORMAL LOW (ref >60)
Glucose, Bld: 122 mg/dL — ABNORMAL HIGH (ref 70–99)
Potassium: 4.1 mmol/L (ref 3.5–5.1)
Sodium: 141 mmol/L (ref 135–145)
Total Bilirubin: 0.6 mg/dL (ref 0.3–1.2)
Total Protein: 6.9 g/dL (ref 6.5–8.1)

## 2019-10-19 MED ORDER — AMOXICILLIN-POT CLAVULANATE 875-125 MG PO TABS: 1.0000 | ORAL_TABLET | Freq: Two times a day (BID) | ORAL | 0 refills | Status: AC

## 2019-10-19 MED ORDER — FLUCONAZOLE 100 MG PO TABS
100.0000 mg | ORAL_TABLET | Freq: Every day | ORAL | Status: DC
  Administered 2019-10-19: 100 mg via ORAL
  Filled 2019-10-19: qty 1

## 2019-10-19 MED ORDER — FLUCONAZOLE 100 MG PO TABS: 100.0000 mg | ORAL_TABLET | Freq: Every day | ORAL | Status: AC

## 2019-10-19 MED ORDER — DEXTROSE-NACL 5-0.45 % IV SOLN: INTRAVENOUS | Status: DC

## 2019-10-19 NOTE — Progress Notes (Signed)
PROGRESS NOTE    Donald [name]  JJH:[id number] DOB: 11-05-1940 DOA: 10/14/2019 PCP: Jani Gravel, MD   Brief Narrative:  79 year old with history of advanced Alzheimer's dementia, BPH, chronic indwelling Foley, HTN, GERD, dysphagia on pured diet, hydronephrosis, COVID-19 infection in January 2021 admitted to the hospital for altered mental status, hypotension secondary to catheter associated UTI complicated by AKI, hyponatremia, lactic acidosis.  Foley catheter exchanged on 3/13.   Assessment & Plan:   Principal Problem:   Sepsis secondary to UTI [facility]) Active Problems:   Dementia in Alzheimer's disease ([place])   Hypertension   AKI (acute kidney injury) ([place])   Hypernatremia   Lactic acidosis   Anemia   Hyperkalemia   Pressure injury of skin  Sepsis secondary to catheter associated urinary tract infection -Culture data shows multiple species.  Blood cultures are negative. -Urine catheter changed 3/13. -Empiric IV Rocephin-day 6. -Concerns of fungal infection is ordered for chromosome additionally.  Mild to moderate dehydration secondary to poor oral intake Advanced dementia -Gentle hydration.  Continue supportive care -We will add daily Accu-Cheks; D5 1/2 NS   Acute kidney injury on CKD stage IIIa-prerenal in nature -Baseline creatinine 1.3, baseline creatinine 6.4.  Improving with IV fluids.  Monitor urine output. -Renal ultrasound-negative for acute pathology  Anemia of chronic disease -No obvious signs of bleeding.  Baseline hemoglobin around 10.  Essential hypertension -Home medications currently on hold.  Adult failure to thrive with severe protein calorie malnutrition Dysphagia -Currently on dysphagia 1 diet with thin liquids.  Encourage oral intake.  Seen by dietitian.  Goals of care discussion -Previously seen by palliative care service during this admission.  At this time wife would like him to come home with home health services.  Wife Interested  in Speaking with hospice. I will consult TOC team to notify them.   Given his advanced age and comorbidities, patient overall carries poor prognosis   DVT prophylaxis: Cutaneous heparin Code Status: DNR Family Communication:  Spoke with [facility].  She is interested in discussing with hospice team. Disposition Plan:   Patient From= Home  Patient Anticipated D/C place= Home with hospice hopefully.   Barriers= maintain hospital stay to continue monitor him and give him IV antibiotics.  P.o. intake is very inconsistent at this time.  In the meantime hospice care team will speak with the family.   Subjective: Laying in the bed, does not carry on any meaningful conversation.  No acute events noted overnight.  Review of Systems Otherwise negative except as per HPI, including: Difficult to obtain given his mentation  Examination:  Constitutional: Not in acute distress, cachectic frail, nonverbal Respiratory: Clear to auscultation bilaterally Cardiovascular: Normal sinus rhythm, no rubs Abdomen: Nontender nondistended good bowel sounds Musculoskeletal: No edema noted Skin: No rashes seen Neurologic: Unable to thoroughly assess, grossly moving all the extremities Psychiatric: Unable to assess  Foley catheter in place with light yellow urine.  Objective: Vitals:   10/18/19 1345 10/18/19 2016 10/19/19 0616 10/19/19 0815  BP: (!) 143/90 140/69 132/64 117/69  Pulse: 97 (!) 108 (!) 103 (!) 107  Resp: 15 14 14 16   Temp: 98.1 F (36.7 C) 99.6 F (37.6 C) 98.7 F (37.1 C) (!) 97.5 F (36.4 C)  TempSrc: Oral Oral Axillary Axillary  SpO2: 100% 92% 94% 100%  Weight:      Height:        Intake/Output Summary (Last 24 hours) at 10/19/2019 0900 Last data filed at 10/19/2019 0855 Gross per 24 hour  Intake  599.7 ml  Output 1050 ml  Net -450.3 ml   Filed Weights   10/15/19 0408 10/15/19 1742  Weight: 65.4 kg 65.4 kg     Data Reviewed:   CBC: Recent Labs  Lab 10/15/19 0440  10/16/19 0218 10/17/19 0545 10/18/19 0520 10/19/19 0614  WBC 21.1* 18.2* 14.8* 15.7* 16.0*  NEUTROABS 18.6* 15.8* 12.5* 13.4* 13.4*  HGB 10.4* 10.6* 9.7* 9.7* 9.8*  HCT 36.4* 37.2* 32.7* 32.9* 33.6*  MCV 87.1 87.5 85.2 85.7 85.5  PLT 422* 424* 463* 442* 466*   Basic Metabolic Panel: Recent Labs  Lab 10/16/19 0218 10/16/19 1659 10/17/19 0545 10/18/19 0520 10/19/19 0614  NA 145 138 134* 138 141  K 5.0 4.5 4.0 4.5 4.1  CL 109 105 100 108 114*  CO2 21* 20* 23 18* 18*  GLUCOSE 134* 127* 157* 117* 122*  BUN 98* 82* 73* 59* 48*  CREATININE 4.88* 3.92* 3.13* 2.38* 1.92*  CALCIUM 8.3* 8.0* 7.8* 7.8* 8.0*   GFR: Estimated Creatinine Clearance: 27.7 mL/min (A) (by C-G formula based on SCr of 1.92 mg/dL (H)). Liver Function Tests: Recent Labs  Lab 10/15/19 0440 10/16/19 0218 10/17/19 0545 10/18/19 0520 10/19/19 0614  AST 77* 68* 66* 66* 52*  ALT 72* 63* 56* 52* 43  ALKPHOS 99 99 99 77 81  BILITOT 0.4 0.4 0.3 0.7 0.6  PROT 7.5 7.7 7.2 6.9 6.9  ALBUMIN 2.1* 2.1* 1.9* 2.0* 1.9*   No results for input(s): LIPASE, AMYLASE in the last 168 hours. No results for input(s): AMMONIA in the last 168 hours. Coagulation Profile: Recent Labs  Lab 10/14/19 1321  INR 1.4*   Cardiac Enzymes: No results for input(s): CKTOTAL, CKMB, CKMBINDEX, TROPONINI in the last 168 hours. BNP (last 3 results) No results for input(s): PROBNP in the last 8760 hours. HbA1C: No results for input(s): HGBA1C in the last 72 hours. CBG: No results for input(s): GLUCAP in the last 168 hours. Lipid Profile: No results for input(s): CHOL, HDL, LDLCALC, TRIG, CHOLHDL, LDLDIRECT in the last 72 hours. Thyroid Function Tests: No results for input(s): TSH, T4TOTAL, FREET4, T3FREE, THYROIDAB in the last 72 hours. Anemia Panel: No results for input(s): VITAMINB12, FOLATE, FERRITIN, TIBC, IRON, RETICCTPCT in the last 72 hours. Sepsis Labs: Recent Labs  Lab 10/14/19 1321 10/14/19 1600  LATICACIDVEN 5.0* 1.7     Recent Results (from the past 240 hour(s))  Blood Culture (routine x 2)     Status: None   Collection Time: 10/14/19  1:21 PM   Specimen: BLOOD  Result Value Ref Range Status   Specimen Description   Final    BLOOD LEFT ANTECUBITAL Performed at [facility], [address] [place], [place] [address]    Special Requests   Final    BOTTLES DRAWN AEROBIC AND ANAEROBIC Blood Culture adequate volume Performed at [facility], [address] [place], [place] [address]    Culture   Final    NO GROWTH 5 DAYS Performed at [facility], [address] [place], [place] [address]    Report Status 10/19/2019 FINAL  Final  Blood Culture (routine x 2)     Status: None   Collection Time: 10/14/19  1:37 PM   Specimen: BLOOD  Result Value Ref Range Status   Specimen Description   Final    BLOOD RIGHT ANTECUBITAL Performed at [facility], [address] [place], [place] [address]    Special Requests   Final  BOTTLES DRAWN AEROBIC AND ANAEROBIC Blood Culture adequate volume Performed at [facility], [address], [place], [place] [address]    Culture   Final    NO GROWTH 5 DAYS Performed at [facility], [address], [place], [place] [address]    Report Status 10/19/2019 FINAL  Final  Urine culture     Status: Abnormal   Collection Time: 10/14/19  2:48 PM   Specimen: In/Out Cath Urine  Result Value Ref Range Status   Specimen Description   Final    IN/OUT CATH URINE Performed at [facility], [address], [place], [place] [address]    Special Requests   Final    NONE Performed at [facility], [address], [place], [place] [address]    Culture MULTIPLE SPECIES PRESENT, SUGGEST RECOLLECTION (A)  Final   Report Status 10/15/2019 FINAL  Final  MRSA PCR Screening     Status: None   Collection Time: 10/15/19  4:37 PM   Specimen: Nasal Mucosa;  Nasopharyngeal  Result Value Ref Range Status   MRSA by PCR NEGATIVE NEGATIVE Final    Comment:        The GeneXpert MRSA Assay (FDA approved for NASAL specimens only), is one component of a comprehensive MRSA colonization surveillance program. It is not intended to diagnose MRSA infection nor to guide or monitor treatment for MRSA infections. Performed at [facility], [address] [place], [place] [address]   Culture, Urine     Status: Abnormal   Collection Time: 10/16/19 10:06 AM   Specimen: Urine, Random  Result Value Ref Range Status   Specimen Description   Final    URINE, RANDOM Performed at [facility], [address] [place], [place] [address]    Special Requests   Final    Normal Performed at [facility], [address] [place], [place] [address]    Culture MULTIPLE SPECIES PRESENT, SUGGEST RECOLLECTION (A)  Final   Report Status 10/17/2019 FINAL  Final         Radiology Studies: CT HEAD WO CONTRAST  Result Date: 10/18/2019 CLINICAL DATA:  Altered mental status, encephalopathy EXAM: CT HEAD WITHOUT CONTRAST TECHNIQUE: Contiguous axial images were obtained from the base of the skull through the vertex without intravenous contrast. COMPARISON:  10/29/2015 FINDINGS: Brain: There is no mass, hemorrhage or extra-axial collection. There is generalized atrophy without lobar predilection. Hypodensity of the white matter is most commonly associated with chronic microvascular disease. Vascular: No abnormal hyperdensity of the major intracranial arteries or dural venous sinuses. No intracranial atherosclerosis. Skull: The visualized skull base, calvarium and extracranial soft tissues are normal. Sinuses/Orbits: No fluid levels or advanced mucosal thickening of the visualized paranasal sinuses. No mastoid or middle ear effusion. The orbits are normal. IMPRESSION: Chronic small vessel disease and generalized volume  loss without acute intracranial abnormality. The degree of atrophy has progressed since 10/29/2015. Electronically Signed   By: Deatra Robinson M.D.   On: 10/18/2019 18:56        Scheduled Meds: . Chlorhexidine Gluconate Cloth  6 each Topical Daily  . feeding supplement (ENSURE ENLIVE)  237 mL Oral BID BM  . feeding supplement (PRO-STAT SUGAR FREE 64)  30 mL Oral BID  . fluconazole  100 mg Oral Daily  . heparin  5,000 Units Subcutaneous Q12H  . mouth rinse  15 mL Mouth Rinse BID  . multivitamin  1 tablet Oral  Daily  . sodium chloride flush  10-40 mL Intracatheter Q12H   Continuous Infusions: . sodium chloride 75 mL/hr at 10/19/19 0229  . cefTRIAXone (ROCEPHIN)  IV Stopped (10/18/19 1429)     LOS: 5 days   Time spent= 25 mins    Marchia Diguglielmo Joline Maxcy, MD [facility]  If 7PM-7AM, please contact night-coverage  10/19/2019, 9:00 AM

## 2019-10-19 NOTE — Plan of Care (Signed)
  Problem: Clinical Measurements: Goal: Respiratory complications will improve Outcome: Progressing Goal: Cardiovascular complication will be avoided Outcome: Progressing   Problem: Coping: Goal: Level of anxiety will decrease Outcome: Progressing   Problem: Elimination: Goal: Will not experience complications related to urinary retention Outcome: Progressing   Problem: Safety: Goal: Ability to remain free from injury will improve Outcome: Progressing   Problem: Education: Goal: Knowledge of General Education information will improve Description: Including pain rating scale, medication(s)/side effects and non-pharmacologic comfort measures Outcome: Not Progressing   Problem: Health Behavior/Discharge Planning: Goal: Ability to manage health-related needs will improve Outcome: Not Progressing   Problem: Clinical Measurements: Goal: Ability to maintain clinical measurements within normal limits will improve Outcome: Not Progressing Goal: Will remain free from infection Outcome: Not Progressing Goal: Diagnostic test results will improve Outcome: Not Progressing   Problem: Activity: Goal: Risk for activity intolerance will decrease Outcome: Not Progressing   Problem: Nutrition: Goal: Adequate nutrition will be maintained Outcome: Not Progressing   Problem: Elimination: Goal: Will not experience complications related to bowel motility Outcome: Not Progressing   Problem: Pain Managment: Goal: General experience of comfort will improve Outcome: Not Progressing   Problem: Skin Integrity: Goal: Risk for impaired skin integrity will decrease Outcome: Not Progressing

## 2019-10-19 NOTE — Progress Notes (Addendum)
Yellow MEWS triggered due to Patient LOC!  client responds to voice / pain which is baseline since admission -  no acute changes - Notified charge RN to see what to do from here - since no acute changes are present and patient is at baseline, was informed to make a note against the yellow mews score. Notified House Coverage (JP) to make sure I didn't need to follow the MEWS protocol since this is patient's baseline. House coverage stated since it is his baseline and no acute changes have occurred, MEWS protocol did not need to be initiated.

## 2019-10-19 NOTE — Discharge Summary (Signed)
Physician Discharge Summary  Donald [name] [id number] DOB: March 03, 1941 DOA: 10/14/2019  PCP: Jani Gravel, MD  Admit date: 10/14/2019 Discharge date: 10/19/2019  Admitted From: Home  Disposition:  Hospice, [facility]   Recommendations for Outpatient Follow-up:  1. Follow up with PCP in 1-2 weeks 2. Please obtain BMP/CBC in one week your next doctors visit.  3. Fluconazole and Augmentin for 7 days PO   Discharge Condition: Stable CODE STATUS: DNR Diet recommendation: Regular  Brief/Interim Summary: 79 year old with history of advanced Alzheimer's dementia, BPH, chronic indwelling Foley, HTN, GERD, dysphagia on pured diet, hydronephrosis, COVID-19 infection in January 2021 admitted to the hospital for altered mental status, hypotension secondary to catheter associated UTI complicated by AKI, hyponatremia, lactic acidosis.  Foley catheter exchanged on 3/13. Given his advanced age and comorbidities, patient overall carries poor prognosis. Wife showed interest in Speaking with hospice. Patient was transitioned to [facility] for hospice.     Sepsis secondary to catheter associated urinary tract infection -Culture data shows multiple species.  Blood cultures are negative. -Urine catheter changed 3/13. -Empiric IV Rocephin-day 6.Transition to Augmentin for 7 days po -Concerns of fungal infection- On Fluzonazole for 7 more days as well.   Mild to moderate dehydration secondary to poor oral intake Advanced dementia -Encourage po intake.   Acute kidney injury on CKD stage IIIa-prerenal in nature -Baseline creatinine 1.3, baseline creatinine 6.4.  Improving with IV fluids.  Monitor urine output. -Renal ultrasound-negative for acute pathology  Anemia of chronic disease -No obvious signs of bleeding.  Baseline hemoglobin around 10.  Essential hypertension -Home medications currently on hold.  Adult failure to thrive with severe protein calorie  malnutrition Dysphagia -Currently on dysphagia 1 diet with thin liquids.  Encourage oral intake.  Seen by dietitian.  Goals of care discussion -Previously seen by palliative care service during this admission.     Discharge Diagnoses:  Principal Problem:   Sepsis secondary to UTI [facility]) Active Problems:   Dementia in Alzheimer's disease ([place])   Hypertension   AKI (acute kidney injury) ([place])   Hypernatremia   Lactic acidosis   Anemia   Hyperkalemia   Pressure injury of skin    Subjective: Laying in the bed no complaints. Spoke with his wife who was interested in hospice.   Discharge Exam: Vitals:   10/19/19 0815 10/19/19 1020  BP: 117/69 132/61  Pulse: (!) 107 (!) 101  Resp: 16 14  Temp: (!) 97.5 F (36.4 C) 98.2 F (36.8 C)  SpO2: 100% 100%   Vitals:   10/18/19 2016 10/19/19 0616 10/19/19 0815 10/19/19 1020  BP: 140/69 132/64 117/69 132/61  Pulse: (!) 108 (!) 103 (!) 107 (!) 101  Resp: 14 14 16 14   Temp: 99.6 F (37.6 C) 98.7 F (37.1 C) (!) 97.5 F (36.4 C) 98.2 F (36.8 C)  TempSrc: Oral Axillary Axillary Axillary  SpO2: 92% 94% 100% 100%  Weight:      Height:        General: NAD, non verbal. cachecitc elderly frail Cardiovascular: RRR, S1/S2 +, no rubs, no gallops Respiratory: CTA bilaterally, no wheezing, no rhonchi Abdominal: Soft, NT, ND, bowel sounds + Extremities: no edema, no cyanosis Foley in place.   Discharge Instructions   Allergies as of 10/19/2019   No Known Allergies     Medication List    TAKE these medications   acetaminophen 325 MG tablet Commonly known as: TYLENOL Take 2 tablets (650 mg total) by mouth every 6 (six) hours as needed  for mild pain (or Fever >/= 101).   albuterol 108 (90 Base) MCG/ACT inhaler Commonly known as: VENTOLIN HFA Inhale 2 puffs into the lungs every 4 (four) hours as needed for wheezing or shortness of breath.   amLODipine 5 MG tablet Commonly known as: NORVASC Take 1 tablet (5 mg total) by  mouth 2 (two) times daily. What changed:   how much to take  when to take this   amoxicillin-clavulanate 875-125 MG tablet Commonly known as: Augmentin Take 1 tablet by mouth every 12 (twelve) hours for 7 days.   feeding supplement (NEPRO CARB STEADY) Liqd Take 237 mLs by mouth 3 (three) times daily as needed (Supplement).   fluconazole 100 MG tablet Commonly known as: DIFLUCAN Take 1 tablet (100 mg total) by mouth daily for 7 days. Start taking on: October 20, 2019   guaiFENesin-codeine 100-10 MG/5ML syrup Take 5 mLs by mouth every 6 (six) hours.   lisinopril 40 MG tablet Commonly known as: ZESTRIL Take 40 mg by mouth daily.   mupirocin ointment 2 % Commonly known as: BACTROBAN Apply 1 application topically 3 (three) times daily. Apply to Facial Lesions   NONFORMULARY OR COMPOUNDED ITEM Apply 1 application topically 3 (three) times daily. Triamcinolone cream 0.1% Nystatin Zinc compound mixed 1:1:1 Apply to scrotum tid   QUEtiapine 50 MG tablet Commonly known as: SEROQUEL Take 1 tablet (50 mg total) by mouth at bedtime. What changed: how much to take   sorbitol 70 % Soln Take 30 mLs by mouth as needed for moderate constipation.      Follow-up Information    [facility] Follow up.   Specialty: Home Health Services Why: Home Health RN, Physical Therapy, Occupational Therapy-agency will call to arrange appointment Contact information: [address] [place] [address] [phone]          No Known Allergies  You were cared for by a hospitalist during your hospital stay. If you have any questions about your discharge medications or the care you received while you were in the hospital after you are discharged, you can call the unit and asked to speak with the hospitalist on call if the hospitalist that took care of you is not available. Once you are discharged, your primary care physician will handle any further medical issues.  Please note that no refills for any discharge medications will be authorized once you are discharged, as it is imperative that you return to your primary care physician (or establish a relationship with a primary care physician if you do not have one) for your aftercare needs so that they can reassess your need for medications and monitor your lab values.   Procedures/Studies: CT HEAD WO CONTRAST  Result Date: 10/18/2019 CLINICAL DATA:  Altered mental status, encephalopathy EXAM: CT HEAD WITHOUT CONTRAST TECHNIQUE: Contiguous axial images were obtained from the base of the skull through the vertex without intravenous contrast. COMPARISON:  10/29/2015 FINDINGS: Brain: There is no mass, hemorrhage or extra-axial collection. There is generalized atrophy without lobar predilection. Hypodensity of the white matter is most commonly associated with chronic microvascular disease. Vascular: No abnormal hyperdensity of the major intracranial arteries or dural venous sinuses. No intracranial atherosclerosis. Skull: The visualized skull base, calvarium and extracranial soft tissues are normal. Sinuses/Orbits: No fluid levels or advanced mucosal thickening of the visualized paranasal sinuses. No mastoid or middle ear effusion. The orbits are normal. IMPRESSION: Chronic small vessel disease and generalized volume loss without acute intracranial abnormality. The  degree of atrophy has progressed since 10/29/2015. Electronically Signed   By: Deatra Robinson M.D.   On: 10/18/2019 18:56   US Renal  Result Date: 10/14/2019 CLINICAL DATA:  Increased creatinine and increased lactic acid. EXAM: RENAL / URINARY TRACT ULTRASOUND COMPLETE COMPARISON:  CT abdomen dated 10/02/2018. FINDINGS: Right Kidney: Renal measurements: 11.4 x 5.1 x 6.1 cm = volume: 186 mL. Multiple cysts. No suspicious mass. Peripelvic cysts demonstrated on earlier CT. No convincing evidence of concomitant hydronephrosis. Left Kidney: Renal measurements: 10.4 x  4.6 cm. Not well visualized. No mass or hydronephrosis identified. Bladder: Not seen, per sonographer, presumably decompressed by Foley catheter. Other: Study limited by lack of patient cooperation. IMPRESSION: 1. No acute findings. No evidence of hydronephrosis, with study limitations detailed above. 2. Bilateral renal cysts, better demonstrated on earlier CT abdomen of 10/02/2018. Electronically Signed   By: Bary Richard M.D.   On: 10/14/2019 15:20   DG Chest Port 1 View  Result Date: 10/14/2019 CLINICAL DATA:  Weakness. EXAM: PORTABLE CHEST 1 VIEW COMPARISON:  09/20/2019 FINDINGS: Normal sized heart. Tortuous aorta. Mild linear density at the right lung base, increased. Interval minimal linear density at the left lateral lung base. Otherwise, clear lungs with normal vascularity. Poor inspiration. Mild lower thoracic spine degenerative changes. IMPRESSION: Poor inspiration with interval mild right basilar linear atelectasis and minimal left basilar linear atelectasis. Electronically Signed   By: Beckie Salts M.D.   On: 10/14/2019 13:04   DG Chest Port 1 View  Result Date: 09/20/2019 CLINICAL DATA:  79 year old male with weakness, altered mental status. COVID-19 last month. EXAM: PORTABLE CHEST 1 VIEW COMPARISON:  Portable chest 08/22/2019 and earlier. FINDINGS: Portable AP semi upright view at 1842 hours. Stable low lung volumes but improved bilateral ventilation since January. Today Allowing for portable technique the lungs are largely clear. There is mild residual increased interstitial opacity at the left lung base and about the right hilum. Mediastinal contours are within normal limits. Visualized tracheal air column is within normal limits. Negative visible bowel gas pattern. No acute osseous abnormality identified. IMPRESSION: Regressed bilateral pneumonia and improved ventilation since January. Mild residual interstitial opacity. Electronically Signed   By: Odessa Fleming M.D.   On: 09/20/2019 19:25       The results of significant diagnostics from this hospitalization (including imaging, microbiology, ancillary and laboratory) are listed below for reference.     Microbiology: Recent Results (from the past 240 hour(s))  Blood Culture (routine x 2)     Status: None   Collection Time: 10/14/19  1:21 PM   Specimen: BLOOD  Result Value Ref Range Status   Specimen Description   Final    BLOOD LEFT ANTECUBITAL Performed at [facility], [address], [place], [place] [address]    Special Requests   Final    BOTTLES DRAWN AEROBIC AND ANAEROBIC Blood Culture adequate volume Performed at [facility], [address], [place], [place] [address]    Culture   Final    NO GROWTH 5 DAYS Performed at [facility], [address] [place], [place] [address]    Report Status 10/19/2019 FINAL  Final  Blood Culture (routine x 2)     Status: None   Collection Time: 10/14/19  1:37 PM   Specimen: BLOOD  Result Value Ref Range Status   Specimen Description   Final    BLOOD RIGHT ANTECUBITAL Performed at [facility], [address] [place], [place]  [id number]    Special Requests   Final    BOTTLES DRAWN AEROBIC AND ANAEROBIC Blood Culture adequate volume Performed at [facility], [address] [place], [place] [address]    Culture   Final    NO GROWTH 5 DAYS Performed at [facility], [address] [place], [place] [address]    Report Status 10/19/2019 FINAL  Final  Urine culture     Status: Abnormal   Collection Time: 10/14/19  2:48 PM   Specimen: In/Out Cath Urine  Result Value Ref Range Status   Specimen Description   Final    IN/OUT CATH URINE Performed at [facility], [address] [place], [place] [address]    Special Requests   Final    NONE Performed at [facility], [address] [place], [place] [address]    Culture MULTIPLE SPECIES  PRESENT, SUGGEST RECOLLECTION (A)  Final   Report Status 10/15/2019 FINAL  Final  MRSA PCR Screening     Status: None   Collection Time: 10/15/19  4:37 PM   Specimen: Nasal Mucosa; Nasopharyngeal  Result Value Ref Range Status   MRSA by PCR NEGATIVE NEGATIVE Final    Comment:        The GeneXpert MRSA Assay (FDA approved for NASAL specimens only), is one component of a comprehensive MRSA colonization surveillance program. It is not intended to diagnose MRSA infection nor to guide or monitor treatment for MRSA infections. Performed at [facility], [address] [place], [place] [address]   Culture, Urine     Status: Abnormal   Collection Time: 10/16/19 10:06 AM   Specimen: Urine, Random  Result Value Ref Range Status   Specimen Description   Final    URINE, RANDOM Performed at [facility], [address] [place], [place] [id number]    Special Requests   Final    Normal Performed at [facility], [address] [place], [place] [address]    Culture MULTIPLE SPECIES PRESENT, SUGGEST RECOLLECTION (A)  Final   Report Status 10/17/2019 FINAL  Final     Labs: BNP (last 3 results) Recent Labs    08/19/19 1605  BNP 330.2*   Basic Metabolic Panel: Recent Labs  Lab 10/16/19 0218 10/16/19 1659 10/17/19 0545 10/18/19 0520 10/19/19 0614  NA 145 138 134* 138 141  K 5.0 4.5 4.0 4.5 4.1  CL 109 105 100 108 114*  CO2 21* 20* 23 18* 18*  GLUCOSE 134* 127* 157* 117* 122*  BUN 98* 82* 73* 59* 48*  CREATININE 4.88* 3.92* 3.13* 2.38* 1.92*  CALCIUM 8.3* 8.0* 7.8* 7.8* 8.0*   Liver Function Tests: Recent Labs  Lab 10/15/19 0440 10/16/19 0218 10/17/19 0545 10/18/19 0520 10/19/19 0614  AST 77* 68* 66* 66* 52*  ALT 72* 63* 56* 52* 43  ALKPHOS 99 99 99 77 81  BILITOT 0.4 0.4 0.3 0.7 0.6  PROT 7.5 7.7 7.2 6.9 6.9  ALBUMIN 2.1* 2.1* 1.9* 2.0* 1.9*   No results for input(s): LIPASE, AMYLASE in the last 168  hours. No results for input(s): AMMONIA in the last 168 hours. CBC: Recent Labs  Lab 10/15/19 0440 10/16/19 0218 10/17/19 0545 10/18/19 0520 10/19/19 0614  WBC 21.1* 18.2* 14.8* 15.7* 16.0*  NEUTROABS 18.6* 15.8* 12.5* 13.4* 13.4*  HGB 10.4* 10.6* 9.7* 9.7* 9.8*  HCT 36.4* 37.2* 32.7* 32.9* 33.6*  MCV 87.1 87.5 85.2 85.7 85.5  PLT 422*  424* 463* 442* 466*   Cardiac Enzymes: No results for input(s): CKTOTAL, CKMB, CKMBINDEX, TROPONINI in the last 168 hours. BNP: Invalid input(s): POCBNP CBG: No results for input(s): GLUCAP in the last 168 hours. D-Dimer No results for input(s): DDIMER in the last 72 hours. Hgb A1c No results for input(s): HGBA1C in the last 72 hours. Lipid Profile No results for input(s): CHOL, HDL, LDLCALC, TRIG, CHOLHDL, LDLDIRECT in the last 72 hours. Thyroid function studies No results for input(s): TSH, T4TOTAL, T3FREE, THYROIDAB in the last 72 hours.  Invalid input(s): FREET3 Anemia work up No results for input(s): VITAMINB12, FOLATE, FERRITIN, TIBC, IRON, RETICCTPCT in the last 72 hours. Urinalysis    Component Value Date/Time   COLORURINE YELLOW 10/14/2019 1448   APPEARANCEUR CLOUDY (A) 10/14/2019 1448   LABSPEC 1.015 10/14/2019 1448   PHURINE 5.0 10/14/2019 1448   GLUCOSEU NEGATIVE 10/14/2019 1448   HGBUR LARGE (A) 10/14/2019 1448   BILIRUBINUR NEGATIVE 10/14/2019 1448   KETONESUR NEGATIVE 10/14/2019 1448   PROTEINUR 100 (A) 10/14/2019 1448   UROBILINOGEN 0.2 02/02/2014 1804   NITRITE POSITIVE (A) 10/14/2019 1448   LEUKOCYTESUR LARGE (A) 10/14/2019 1448   Sepsis Labs Invalid input(s): PROCALCITONIN,  WBC,  LACTICIDVEN Microbiology Recent Results (from the past 240 hour(s))  Blood Culture (routine x 2)     Status: None   Collection Time: 10/14/19  1:21 PM   Specimen: BLOOD  Result Value Ref Range Status   Specimen Description   Final    BLOOD LEFT ANTECUBITAL Performed at [facility], [address]  [place], [place] [address]    Special Requests   Final    BOTTLES DRAWN AEROBIC AND ANAEROBIC Blood Culture adequate volume Performed at [facility], [address], [place], [place] [address]    Culture   Final    NO GROWTH 5 DAYS Performed at [facility], [address], [place], [place] [address]    Report Status 10/19/2019 FINAL  Final  Blood Culture (routine x 2)     Status: None   Collection Time: 10/14/19  1:37 PM   Specimen: BLOOD  Result Value Ref Range Status   Specimen Description   Final    BLOOD RIGHT ANTECUBITAL Performed at [name], [address], [place], [place] [address]    Special Requests   Final    BOTTLES DRAWN AEROBIC AND ANAEROBIC Blood Culture adequate volume Performed at [facility], [address], [place], [place] [address]    Culture   Final    NO GROWTH 5 DAYS Performed at [facility], [address], [place], [place] [address]    Report Status 10/19/2019 FINAL  Final  Urine culture     Status: Abnormal   Collection Time: 10/14/19  2:48 PM   Specimen: In/Out Cath Urine  Result Value Ref Range Status   Specimen Description   Final    IN/OUT CATH URINE Performed at [facility], [address], [place], [place] [address]    Special Requests   Final    NONE Performed at [facility], [address] [place], [place] [address]    Culture MULTIPLE SPECIES PRESENT, SUGGEST RECOLLECTION (A)  Final   Report Status 10/15/2019 FINAL  Final  MRSA PCR Screening     Status: None   Collection Time: 10/15/19  4:37 PM   Specimen: Nasal Mucosa; Nasopharyngeal  Result Value Ref Range Status  MRSA by PCR NEGATIVE NEGATIVE Final    Comment:        The GeneXpert MRSA Assay (FDA approved for NASAL specimens only), is one component of a comprehensive MRSA colonization surveillance program. It is not intended to diagnose MRSA infection nor to  guide or monitor treatment for MRSA infections. Performed at [facility], [address] [place], [place] [address]   Culture, Urine     Status: Abnormal   Collection Time: 10/16/19 10:06 AM   Specimen: Urine, Random  Result Value Ref Range Status   Specimen Description   Final    URINE, RANDOM Performed at [facility], [address], [place], [place] [address]    Special Requests   Final    Normal Performed at [facility], [address] [place], [place] [address]    Culture MULTIPLE SPECIES PRESENT, SUGGEST RECOLLECTION (A)  Final   Report Status 10/17/2019 FINAL  Final     Time coordinating discharge:  I have spent 35 minutes face to face with the patient and on the ward discussing the patients care, assessment, plan and disposition with other care givers. >50% of the time was devoted counseling the patient about the risks and benefits of treatment/Discharge disposition and coordinating care.   SIGNED:   Dimple Nanas, MD  [facility] 10/19/2019, 2:11 PM   If 7PM-7AM, please contact night-coverage

## 2019-10-19 NOTE — Progress Notes (Signed)
Civil engineer, contracting [facility])  Referral received for [facility] and we have a bed for Mr. Fries.  Spoke with wife [name], confirmed interest and explained services.  Once consents are completed, will let [facility] manager know so transport can be arranged.  RN staff:  Please call report to [phone], please leave foley in place, you may d/c any PIVs.  Room is assigned when report is called.  Please fax dc summary to [phone].  Wallis [place] RN, BSN, CCRN [facility] Liaison (in [place]) [phone]

## 2019-10-19 NOTE — TOC Transition Note (Signed)
Transition of Care [facility]) - CM/SW Discharge Note   Patient Details  Name: HANSEL DEVAN MRN: [id number] Date of Birth: Apr 18, 1941  [facility]) CM/SW Contact:  Bartholome Bill, RN Phone Number: 10/19/2019, 1:54 PM   Clinical Narrative:    This CM spoke with pt wife about dc plan. Wife states that she is interested in hospice services and she doesn't believe she can continue to care for him at home. Wife asking if pt would qualify for [facility]. This CM contacted [facility] liaison for referral. Per [facility] liaison pt does qualify for [facility] and they have a bed available for him today. [facility] transport will be set up. Yellow DNR to be sent with pt.   Final next level of care: Hospice Medical Facility Barriers to Discharge: Continued Medical Work up   Patient Goals and CMS Choice Patient states their goals for this hospitalization and ongoing recovery are:: plan to return home with Home Health CMS Medicare.gov Compare Post Acute Care list provided to:: Patient Represenative (must comment)(wife- [facility]) Choice offered to / list presented to : Spouse  Readmission Risk Interventions Readmission Risk Prevention Plan 10/19/2019  Transportation Screening Complete  Medication Review Oceanographer) Complete  PCP or Specialist appointment within 3-5 days of discharge Complete  HRI or Home Care Consult Complete  SW Recovery Care/Counseling Consult Complete  Palliative Care Screening Complete  Skilled Nursing Facility Not Applicable  Some recent data might be hidden

## 2019-10-19 NOTE — Progress Notes (Signed)
Pt VS stable for transfer to facility.  Discharge instructions printed and given to EMS personnel.  Midline catheter discontinued.  Catheter intact and clotting within expected timeframe.  Pt transported via EMS to receiving facility.  Report called at this time.

## 2019-10-20 ENCOUNTER — Other Ambulatory Visit: Payer: Self-pay | Admitting: *Deleted

## 2019-10-20 NOTE — Patient Outreach (Addendum)
[facility]) Care Management  10/20/2019  Ardon Franklin Halbig Jan 06, 1941 [id number]   Received the following referral on 10/14/2019 for [facility] CM :  Spoke to wife and states she agreeable to [facility] once he has completed       HH, explained he could have both services. Thank you   Reason for consult -> 3 ED visits and 2 IP in past 6 months      Diagnoses of -> Stroke: Ischemic/TIA    Patient hospitalized 10/14/2019 -10/19/2019 for Sepsis secondary to catheter associated urinary tract infection.  Per chart review, patient discharged to [facility] for hospice, will close case due to enrolled in external program.    Benna Arno H. Gardiner Barefoot, BSN, CCM [facility] Care Management [facility] Telephonic CM Phone: [phone] Fax: [phone]

## 2019-11-04 DEATH — deceased

## 2020-08-21 IMAGING — DX DG CHEST 1V PORT
1 series · 1 of 1 positions shown · non-contrast
Comparison: Portable chest 08/22/2019 and earlier.

CLINICAL DATA: 78-year-old male with weakness, altered mental
status. [id number] last month.

EXAM:
PORTABLE CHEST 1 VIEW

[chest ap]
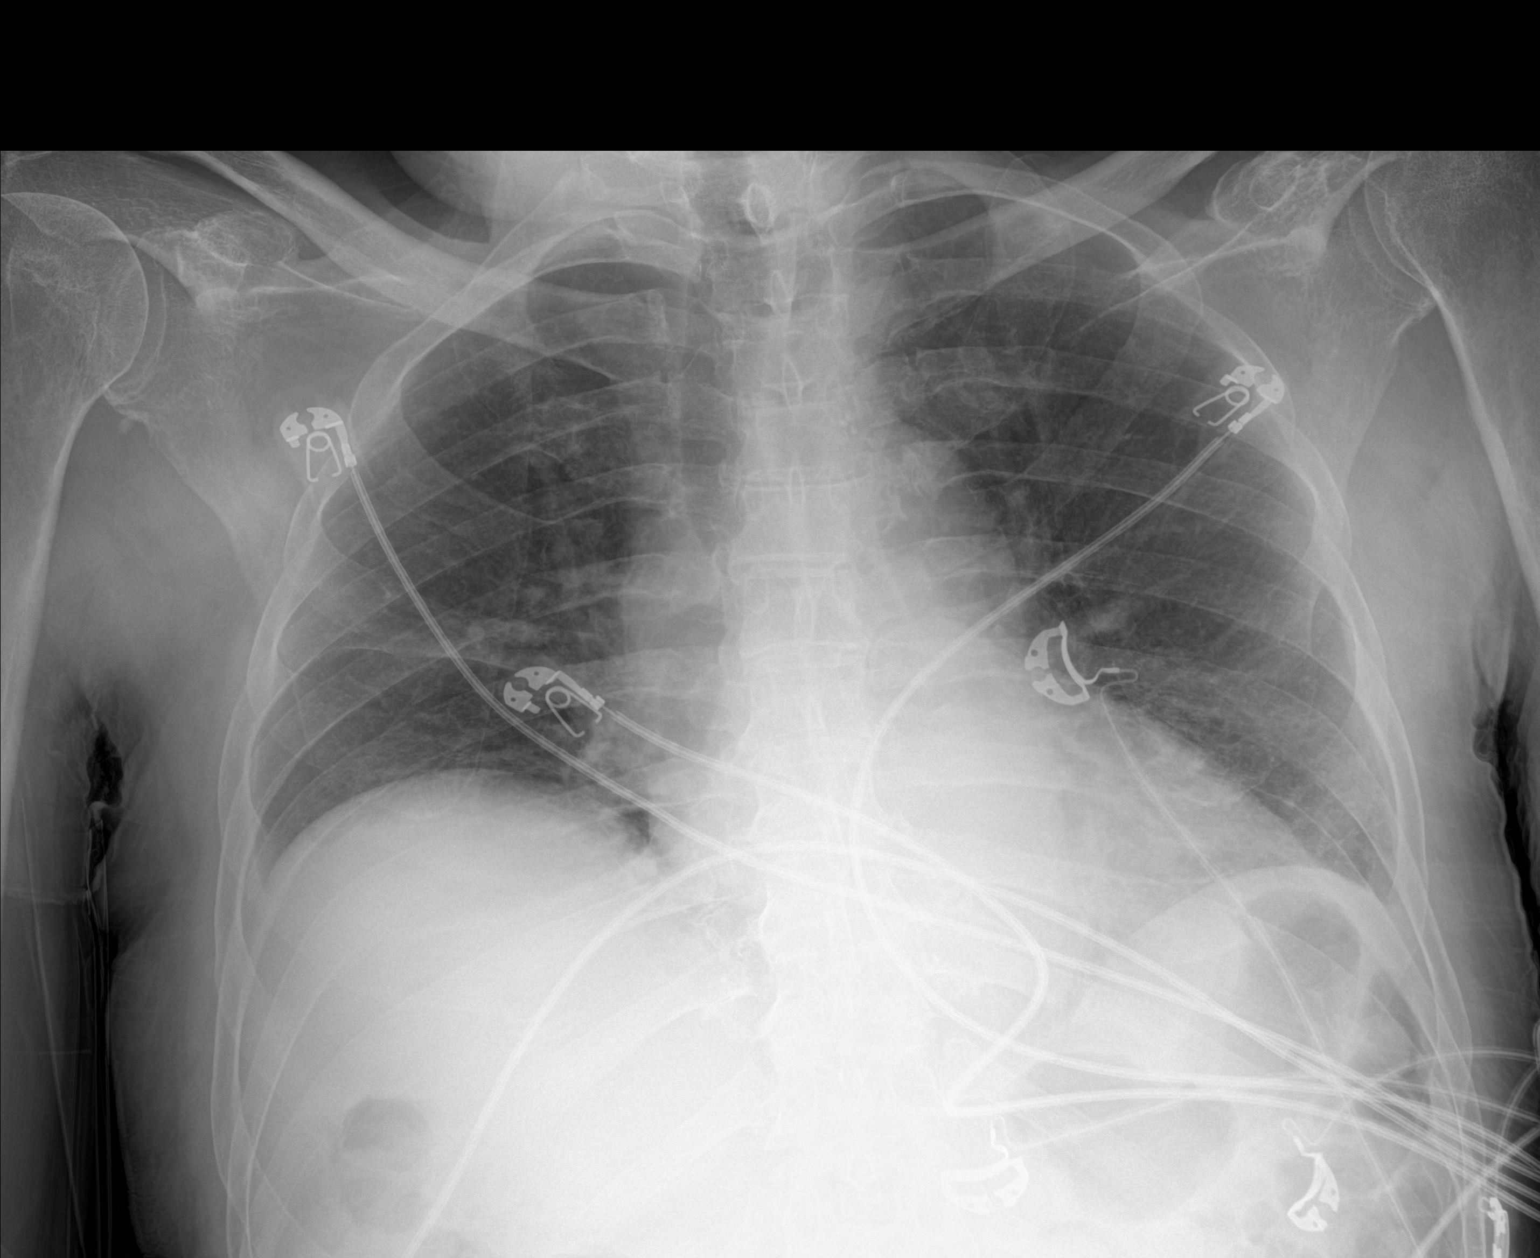

[1 of 1 positions shown; findings below may reference images not displayed]

FINDINGS: Portable AP semi upright view at 5559 hours. Stable low lung volumes
but improved bilateral ventilation since [REDACTED]. Today Allowing for
portable technique the lungs are largely clear. There is mild
residual increased interstitial opacity at the left lung base and
about the right hilum. Mediastinal contours are within normal
limits. Visualized tracheal air column is within normal limits.
Negative visible bowel gas pattern.

No acute osseous abnormality identified.
IMPRESSION: Regressed bilateral pneumonia and improved ventilation since
[REDACTED]. Mild residual interstitial opacity.

## 2020-09-18 IMAGING — CT CT HEAD W/O CM
3 series · 15 of 47 positions shown, 18 images · non-contrast
Comparison: 10/29/2015

CLINICAL DATA: Altered mental status, encephalopathy

EXAM:
CT HEAD WITHOUT CONTRAST
TECHNIQUE: Contiguous axial images were obtained from the base of the skull
through the vertex without intravenous contrast.

[Series 2: head bone · axial · 0.47mm/px · z∈[-141,-11]mm · 9 of 32 slices shown, 12 images]
[im 3/32  brain]
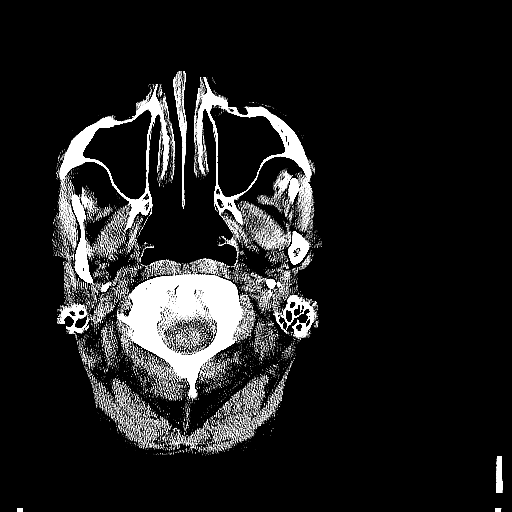
[im 3/32  bone]
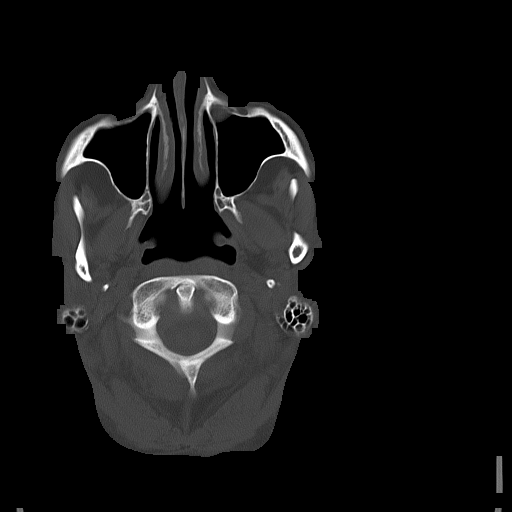
[im 6/32  brain]
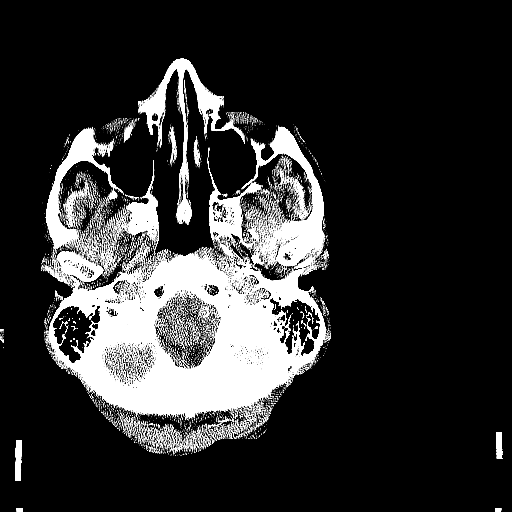
[im 9/32  brain]
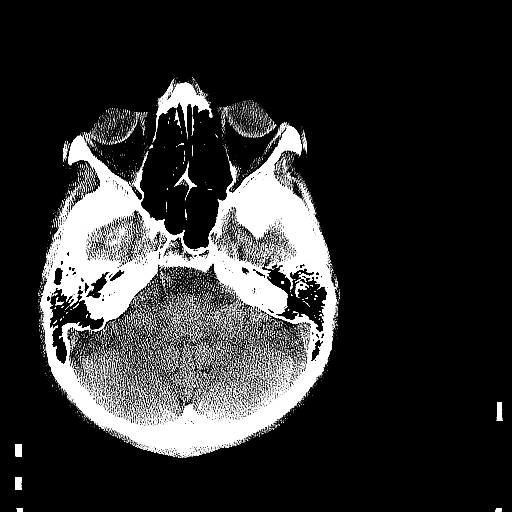
[im 12/32  brain]
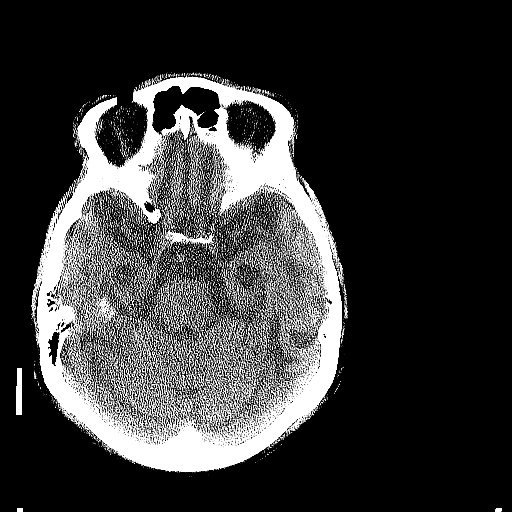
[im 17/32  brain]
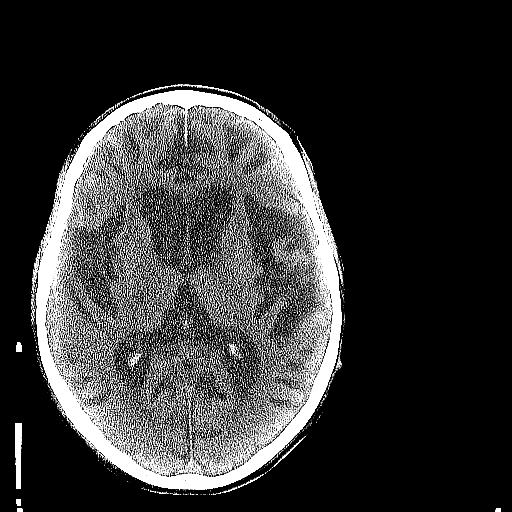
[im 17/32  bone]
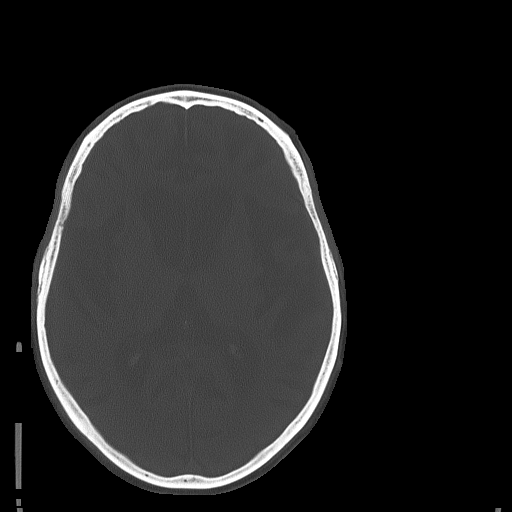
[im 20/32  brain]
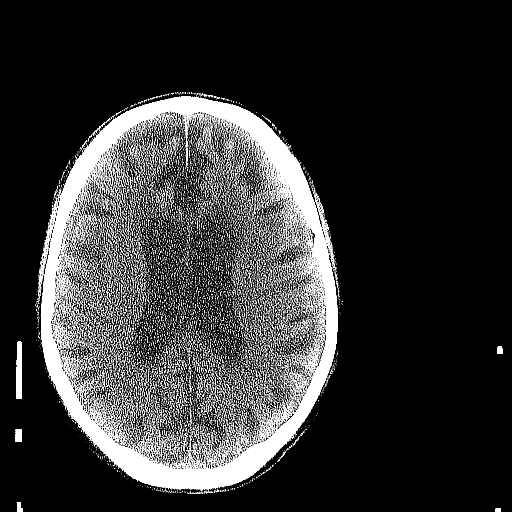
[im 23/32  brain]
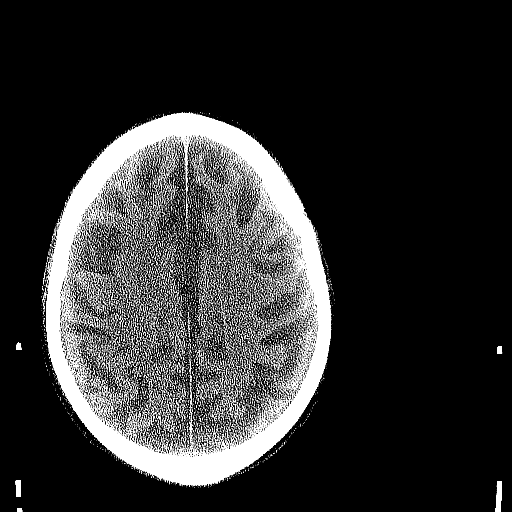
[im 26/32  brain]
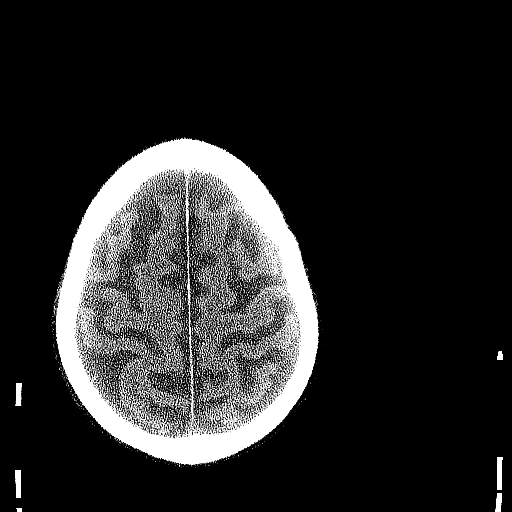
[im 29/32  brain]
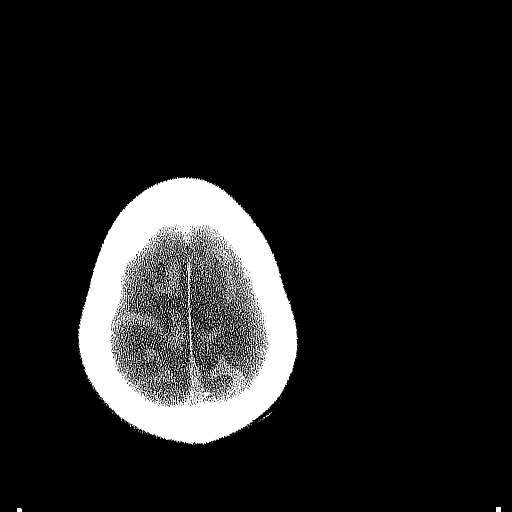
[im 29/32  bone]
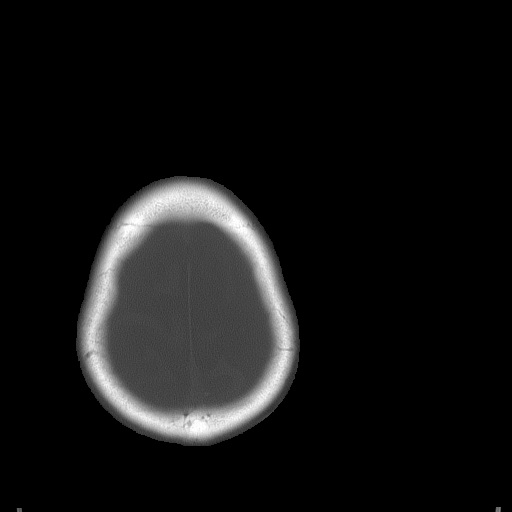

[Series 5: coronal soft tissue · coronal · 0.33mm/px · 3 of 69 slices shown]
[im 23/69  brain]
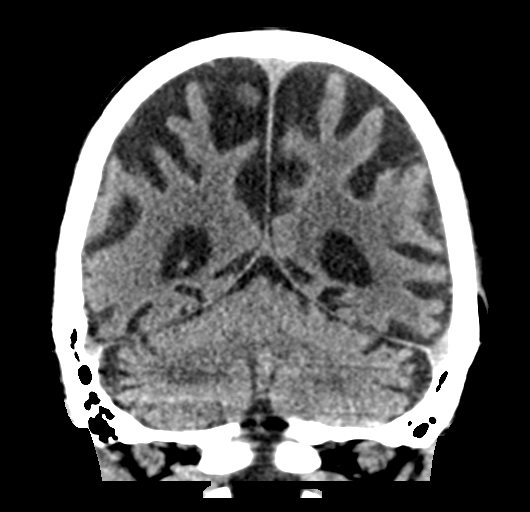
[im 31/69  brain]
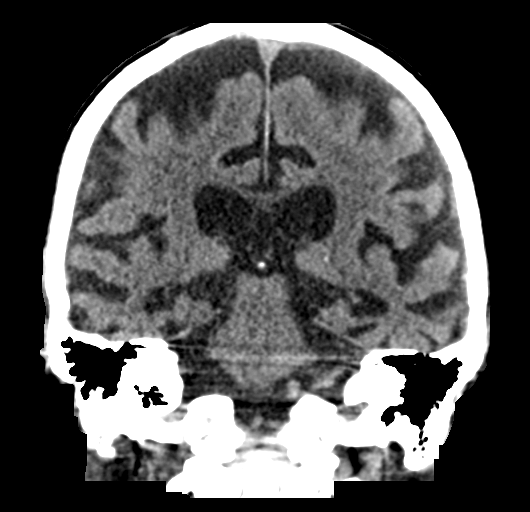
[im 38/69  brain]
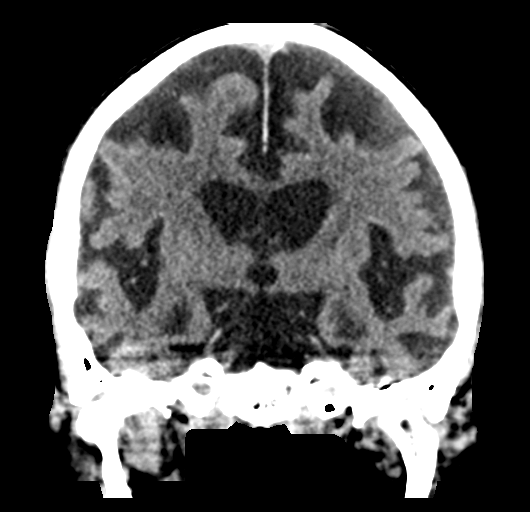

[Series 6: sagittal soft tissue · sagittal · 0.33mm/px · 3 of 58 slices shown]
[im 20/58  brain]
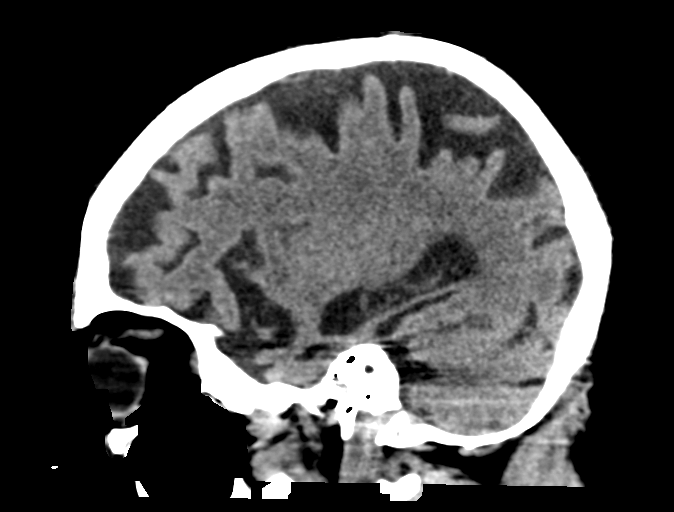
[im 29/58  brain]
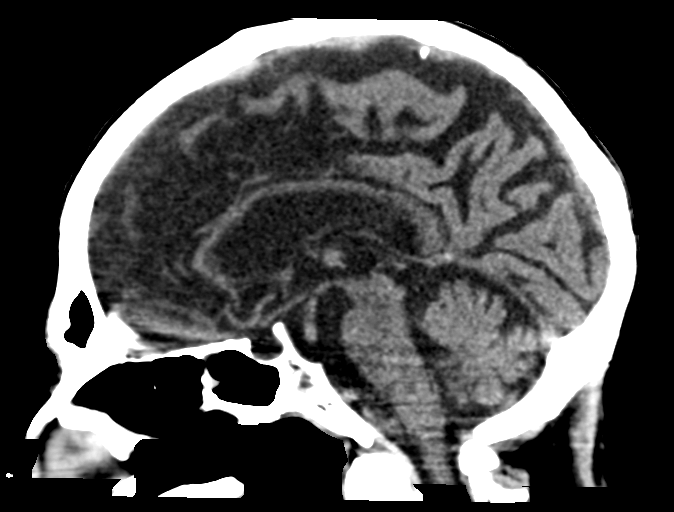
[im 39/58  brain]
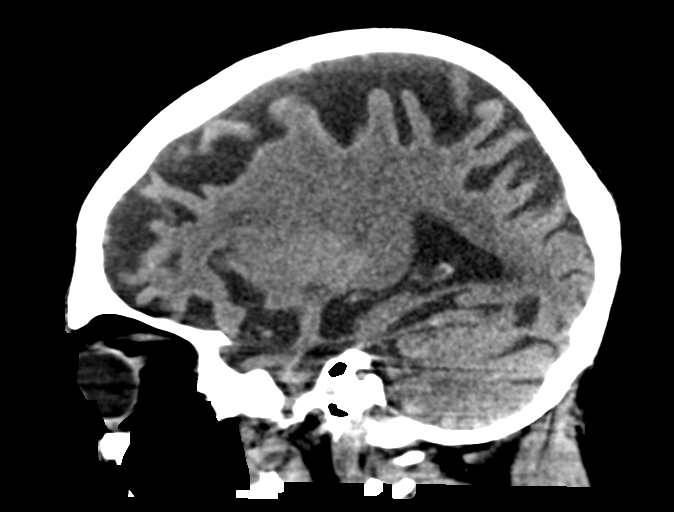

[15 of 47 positions shown; findings below may reference images not displayed]

FINDINGS: Brain: There is no mass, hemorrhage or extra-axial collection. There
is generalized atrophy without lobar predilection. Hypodensity of
the white matter is most commonly associated with chronic
microvascular disease.

Vascular: No abnormal hyperdensity of the major intracranial
arteries or dural venous sinuses. No intracranial atherosclerosis.

Skull: The visualized skull base, calvarium and extracranial soft
tissues are normal.

Sinuses/Orbits: No fluid levels or advanced mucosal thickening of
the visualized paranasal sinuses. No mastoid or middle ear effusion.
The orbits are normal.
IMPRESSION: Chronic small vessel disease and generalized volume loss without
acute intracranial abnormality. The degree of atrophy has progressed
since 10/29/2015.
